# Patient Record
Sex: Female | Born: 1960 | Race: White | Hispanic: No | Marital: Married | Smoking: Never smoker
Health system: Southern US, Community
[De-identification: ages and names within clinical notes are randomized; demographics above are authoritative.]

## PROBLEM LIST (undated history)

## (undated) DIAGNOSIS — I350 Nonrheumatic aortic (valve) stenosis: Secondary | ICD-10-CM

## (undated) DIAGNOSIS — Q2381 Bicuspid aortic valve: Secondary | ICD-10-CM

## (undated) DIAGNOSIS — R112 Nausea with vomiting, unspecified: Secondary | ICD-10-CM

## (undated) DIAGNOSIS — F419 Anxiety disorder, unspecified: Secondary | ICD-10-CM

## (undated) DIAGNOSIS — T8859XA Other complications of anesthesia, initial encounter: Secondary | ICD-10-CM

## (undated) DIAGNOSIS — Z9889 Other specified postprocedural states: Secondary | ICD-10-CM

## (undated) HISTORY — DX: Bicuspid aortic valve: Q23.81

## (undated) HISTORY — PX: TRANSTHORACIC ECHOCARDIOGRAM: SHX275

## (undated) HISTORY — DX: Nonrheumatic aortic (valve) stenosis: I35.0

---
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
## 2008-11-28 HISTORY — PX: ABDOMINAL HYSTERECTOMY: SHX81

## 2014-01-12 DIAGNOSIS — Z8639 Personal history of other endocrine, nutritional and metabolic disease: Secondary | ICD-10-CM | POA: Insufficient documentation

## 2015-01-18 DIAGNOSIS — R002 Palpitations: Secondary | ICD-10-CM | POA: Insufficient documentation

## 2015-01-18 DIAGNOSIS — I8390 Asymptomatic varicose veins of unspecified lower extremity: Secondary | ICD-10-CM | POA: Insufficient documentation

## 2016-03-10 ENCOUNTER — Other Ambulatory Visit: Payer: Self-pay | Admitting: Family Medicine

## 2016-03-10 DIAGNOSIS — R079 Chest pain, unspecified: Secondary | ICD-10-CM

## 2016-03-10 DIAGNOSIS — I493 Ventricular premature depolarization: Secondary | ICD-10-CM

## 2016-03-24 ENCOUNTER — Other Ambulatory Visit (HOSPITAL_COMMUNITY): Payer: Self-pay

## 2016-03-31 HISTORY — PX: TRANSTHORACIC ECHOCARDIOGRAM: SHX275

## 2016-04-20 DIAGNOSIS — Q231 Congenital insufficiency of aortic valve: Secondary | ICD-10-CM | POA: Insufficient documentation

## 2016-04-20 DIAGNOSIS — Q2381 Bicuspid aortic valve: Secondary | ICD-10-CM | POA: Insufficient documentation

## 2016-04-20 DIAGNOSIS — I517 Cardiomegaly: Secondary | ICD-10-CM | POA: Insufficient documentation

## 2016-04-27 DIAGNOSIS — F39 Unspecified mood [affective] disorder: Secondary | ICD-10-CM | POA: Insufficient documentation

## 2016-04-27 DIAGNOSIS — F411 Generalized anxiety disorder: Secondary | ICD-10-CM | POA: Insufficient documentation

## 2016-05-16 ENCOUNTER — Telehealth: Payer: Self-pay | Admitting: Cardiology

## 2016-05-16 NOTE — Telephone Encounter (Signed)
Received records from [facility] for appointment with Dr Ellyn Hack on 06/09/16.  Records given to [employer] (medical records) for D Harding's schedule on 06/09/16. lp

## 2016-05-29 ENCOUNTER — Ambulatory Visit: Payer: BLUE CROSS/BLUE SHIELD | Admitting: Cardiology

## 2016-05-31 HISTORY — PX: LAPAROSCOPIC CHOLECYSTECTOMY: SUR755

## 2016-06-07 DIAGNOSIS — Z9049 Acquired absence of other specified parts of digestive tract: Secondary | ICD-10-CM | POA: Insufficient documentation

## 2016-06-09 ENCOUNTER — Ambulatory Visit: Payer: BLUE CROSS/BLUE SHIELD | Admitting: Cardiology

## 2016-06-14 ENCOUNTER — Encounter: Payer: Self-pay | Admitting: Cardiology

## 2016-08-25 ENCOUNTER — Ambulatory Visit: Payer: BLUE CROSS/BLUE SHIELD | Admitting: Cardiology

## 2016-09-29 ENCOUNTER — Ambulatory Visit (INDEPENDENT_AMBULATORY_CARE_PROVIDER_SITE_OTHER): Payer: BLUE CROSS/BLUE SHIELD | Admitting: Cardiology

## 2016-09-29 ENCOUNTER — Encounter: Payer: Self-pay | Admitting: Cardiology

## 2016-09-29 VITALS — BP 100/98 | HR 78 | Ht 69.0 in | Wt 187.0 lb

## 2016-09-29 DIAGNOSIS — Q231 Congenital insufficiency of aortic valve: Secondary | ICD-10-CM | POA: Diagnosis not present

## 2016-09-29 DIAGNOSIS — Q230 Congenital stenosis of aortic valve: Secondary | ICD-10-CM | POA: Diagnosis not present

## 2016-09-29 DIAGNOSIS — E785 Hyperlipidemia, unspecified: Secondary | ICD-10-CM

## 2016-09-29 NOTE — Progress Notes (Signed)
PCP: Jene Every, MD --Referred by [facility] - Dr. Jene Every  Clinic Note: Chief Complaint  Patient presents with  . Follow-up    New patient.  . Cardiac Valve Problem    Bicuspid Ao Valve    HPI: Olivia Mcgrath is a 56 y.o. female with a PMH below who presents today for Initial Cardiology evaluation fore recently diagnosed Bicuspid Aortic Valve with mild-mod AS - asymptomatic.  Olivia Mcgrath was referred by her PCP @ [facility] after a September 2017 visit for chest pain with a diagnosis of bicuspid aortic valve with mild stenosis Identified for evaluation of her murmur heard preoperatively before her cholecystectomy...  Recent Hospitalizations: n/a  Studies Reviewed:   2-D echo from September 2017: Bicuspid aortic valve with no regurgitation. Mild thickening with moderate calcification. Mild to moderate stenosis. Mild concentric LVH with EF 55-60%. Normal diastolic filling  Interval History: Olivia Mcgrath presents today for evaluation and to establish cardiology care based on the results of her echocardiogram demonstrating mild to moderate aortic stenosis with bicuspid aortic valve. She has relatively no idea what this means, has not had any prior cardiac history. She really doesn't have any cardiac risk factors to speak of. No real family history of coronary disease or valvular cardiac disease.  No history of rheumatic fever.  Besides having occasional headache, and some rare palpitations, she really has no cardiac symptoms. The palpitations usually only occur when she takes an extra cup of coffee or soda. She is fairly active and tries to get a lot of walking in, but has not gotten fully back into her routine since her surgery back in September. 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  Cardiac review of symptoms:  Nno chest pain or shortness of breath with rest or exertion.  No PND, orthopnea or  edema.  No weakness or syncope/near syncope. however she does occasionally note some positional dizziness No TIA/amaurosis fugax symptoms. No claudication.  ROS: A comprehensive was performed. Review of Systems  Constitutional: Negative for weight loss.  HENT: Negative for congestion and nosebleeds.   Respiratory: Negative for shortness of breath.   Cardiovascular: Negative.  Negative for claudication.  Gastrointestinal: Negative for blood in stool and melena.       She does have some loose stools since her surgery, but nothing significant  Genitourinary: Negative for hematuria.  Musculoskeletal: Negative.   Neurological: Positive for dizziness and headaches. Negative for loss of consciousness.  Psychiatric/Behavioral: Negative.   All other systems reviewed and are negative.   Past Medical History:  Diagnosis Date  . Calcific aortic stenosis of bicuspid valve    With mild-moderate stenosis    Past Surgical History:  Procedure Laterality Date  . ABDOMINAL HYSTERECTOMY  11/2008  . LAPAROSCOPIC CHOLECYSTECTOMY  05/2016  . TRANSTHORACIC ECHOCARDIOGRAM  03/2016   Bicuspid aortic valve with no regurgitation. Mild thickening with moderate calcification. Mild to moderate stenosis. Mild concentric LVH with EF 55-60%. Normal diastolic filling    Current Meds  Medication Sig  . escitalopram (LEXAPRO) 10 MG tablet Take 5 mg by mouth daily.  Marland Kitchen estradiol (ESTRACE) 2 MG tablet Take 2 mg by mouth daily.  Marland Kitchen guaiFENesin (MUCINEX) 600 MG 12 hr tablet Take 600 mg by mouth as needed.    Allergies  Allergen Reactions  . Sulfa Antibiotics     Other reaction(s): RASH    Social History   Social History  .  Marital status: Married    Spouse name: N/A  . Number of children: 2  . 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 Smoking status: Never Smoker  . Smokeless tobacco: Never Used  . Alcohol use 0.6 oz/week      1 Glasses of wine per week  . Drug use: No  . Sexual activity: Yes   Other Topics Concern  . None   Social History Narrative   Lives with husband  2 children.   Daughter is Therapist, sports on 2S @ [employer].   Limited exercise - occasional walks up to 2 miles - tries to walk~3-4/wek.       family history includes Atrial fibrillation in her maternal grandmother; Cancer in her maternal grandmother, paternal grandfather, and paternal grandmother; Heart disease in her paternal grandmother; Hyperlipidemia in her father and mother; Hypertension in her father and mother; Lung cancer in her father; Non-Hodgkin's lymphoma in her mother; Rheum arthritis in her brother and mother.  Wt Readings from Last 3 Encounters:  09/29/16 84.8 kg (187 lb)    PHYSICAL EXAM BP (!) 100/98   Pulse 78   Ht 5\' 9"  (1.753 m)   Wt 84.8 kg (187 lb)   BMI 27.62 kg/m  General appearance: alert, cooperative, appears stated age, no distress and Well-nourished, well-groomed.  HEENT: [place]/AT, EOMI, MMM, anicteric sclera Neck: no adenopathy, no carotid bruit and no JVD Lungs: clear to auscultation bilaterally, normal percussion bilaterally and non-labored Heart: regular rate and rhythm, S1 & S2 normal, no click, rub or gallop; nondisplaced PMI. 3/6 early peaking C-D SEM at RUSB radiating to carotids.  Abdomen: soft, non-tender; bowel sounds normal; no masses,  no organomegaly; no HJR Extremities: extremities normal, atraumatic, no cyanosis,  orema  Pulses: 2+ and symmetric; Skin: mobility and turgor normal, no evidence of bleeding or bruising and no lesions noted Neurologic: Mental status: Alert, oriented, thought content appropriate Cranial nerves: normal (II-XII grossly intact)    Adult ECG Report  Rate: 78 ;  Rhythm: normal sinus rhythm and Normal axis, intervals and durations.;   Narrative Interpretation: Normal EKG   Other studies Reviewed: Additional studies/ records that were reviewed today include:  Recent  Labs:  From October 2017   Total cholesterol 234, triglycerides 70, HDL 59, LDL 158   ASSESSMENT / PLAN: Problem List Items Addressed This Visit    Aortic stenosis due to bicuspid aortic valve - Primary (Chronic)    Newly diagnosed asymptomatic aortic valve disease. Last echo was in September 2017. With mild to moderate stenosis, we will follow annually for now. Discussed symptoms to be of concern.  Plan: 2-D echocardiogram in roughly September. She will follow-up after her tests. Discussed importance of aggressive treatment of cardiac risk factors including hyperlipidemia, hypertension and potential diabetes. Her blood pressure is well-controlled, and no reported history of diabetes.      Relevant Orders   EKG 12-Lead   ECHOCARDIOGRAM COMPLETE   Dyslipidemia, goal LDL below 100 (Chronic)    LDL 158 noted from her PCP.  for now the plan seems to be lysed modification, however if this is not successful by the end of the year, think we need to be more aggressive in order to slow the progression of disease with bicuspid aortic valve stenosis.      Relevant Orders   EKG 12-Lead   ECHOCARDIOGRAM COMPLETE  The majority of this visit was history taking as well as descriptive discussion as to the pathophysiology of aortic valve disease with bicuspid aortic valve and the progression of disease to a arch stenosis. We talked about monitoring and management going forward. Over 45 minutes was spent with the patient. Greater than 50% of time was spent in direct consultation.  Current medicines are reviewed at length with the patient today. (+/- concerns) None The following changes have been made: None  Patient Instructions  Schedule at  [address], [address] November 2018 Your physician has requested that you have an echocardiogram. Echocardiography is a painless test that uses sound waves to create images of your heart. It provides your doctor with information about the size and  shape of your heart and how well your heart's chambers and valves are working. This procedure takes approximately one hour. There are no restrictions for this procedure.  No other changes  Your physician wants you to follow-up in: November 2018 with Dr. Ellyn Hack after Echo. You will receive a reminder letter in the mail two months in advance. If you don't receive a letter, please call our office to schedule the follow-up appointment.  Studies Ordered:   Orders Placed This Encounter  Procedures  . EKG 12-Lead  . ECHOCARDIOGRAM COMPLETE      Glenetta Hew, M.D., M.S. Interventional Cardiologist   Pager # [phone] Phone # [phone] [address] [place], [place] [address]

## 2016-09-29 NOTE — Patient Instructions (Addendum)
Schedule at  [address], [address] November 2018 Your physician has requested that you have an echocardiogram. Echocardiography is a painless test that uses sound waves to create images of your heart. It provides your doctor with information about the size and shape of your heart and how well your heart's chambers and valves are working. This procedure takes approximately one hour. There are no restrictions for this procedure.  No other changes  Your physician wants you to follow-up in: November 2018 with Dr. Ellyn Hack after Echo. You will receive a reminder letter in the mail two months in advance. If you don't receive a letter, please call our office to schedule the follow-up appointment.

## 2016-10-01 ENCOUNTER — Encounter: Payer: Self-pay | Admitting: Cardiology

## 2016-10-01 NOTE — Assessment & Plan Note (Signed)
Newly diagnosed asymptomatic aortic valve disease. Last echo was in September 2017. With mild to moderate stenosis, we will follow annually for now. Discussed symptoms to be of concern.  Plan: 2-D echocardiogram in roughly September. She will follow-up after her tests. Discussed importance of aggressive treatment of cardiac risk factors including hyperlipidemia, hypertension and potential diabetes. Her blood pressure is well-controlled, and no reported history of diabetes.

## 2016-10-01 NOTE — Assessment & Plan Note (Signed)
LDL 158 noted from her PCP.  for now the plan seems to be lysed modification, however if this is not successful by the end of the year, think we need to be more aggressive in order to slow the progression of disease with bicuspid aortic valve stenosis.

## 2017-03-30 DIAGNOSIS — G5761 Lesion of plantar nerve, right lower limb: Secondary | ICD-10-CM | POA: Insufficient documentation

## 2017-03-30 DIAGNOSIS — M21611 Bunion of right foot: Secondary | ICD-10-CM | POA: Insufficient documentation

## 2017-06-08 ENCOUNTER — Other Ambulatory Visit (HOSPITAL_COMMUNITY): Payer: BLUE CROSS/BLUE SHIELD

## 2017-06-13 ENCOUNTER — Telehealth: Payer: Self-pay | Admitting: *Deleted

## 2017-06-13 NOTE — Telephone Encounter (Signed)
LEFT MESSAGE TO CALL BACK-- NEED TO TO KNOW IF PATIENT WILL BE COMING TO APPOINTMENT. ECHO WAS NOT DONE 06/18/17. WOULD PREFER PATIENT RESCHEDULE APPOINTMENT UNTIL AFTER TEST IF POSSIBLE.

## 2017-06-15 ENCOUNTER — Ambulatory Visit: Payer: BLUE CROSS/BLUE SHIELD | Admitting: Cardiology

## 2017-06-19 NOTE — Telephone Encounter (Signed)
PATIENT RESCHEDULE APPOINTMENT

## 2017-07-20 ENCOUNTER — Ambulatory Visit (HOSPITAL_COMMUNITY): Payer: BLUE CROSS/BLUE SHIELD | Attending: Internal Medicine

## 2017-07-20 ENCOUNTER — Other Ambulatory Visit: Payer: Self-pay

## 2017-07-20 DIAGNOSIS — E785 Hyperlipidemia, unspecified: Secondary | ICD-10-CM

## 2017-07-20 DIAGNOSIS — Q230 Congenital stenosis of aortic valve: Secondary | ICD-10-CM | POA: Diagnosis present

## 2017-07-20 DIAGNOSIS — I340 Nonrheumatic mitral (valve) insufficiency: Secondary | ICD-10-CM | POA: Diagnosis not present

## 2017-07-20 DIAGNOSIS — Q231 Congenital insufficiency of aortic valve: Secondary | ICD-10-CM

## 2017-08-10 ENCOUNTER — Ambulatory Visit: Payer: BLUE CROSS/BLUE SHIELD | Admitting: Cardiology

## 2017-08-10 ENCOUNTER — Encounter: Payer: Self-pay | Admitting: Cardiology

## 2017-08-10 VITALS — BP 114/84 | HR 69 | Ht 69.5 in | Wt 178.0 lb

## 2017-08-10 DIAGNOSIS — Q230 Congenital stenosis of aortic valve: Secondary | ICD-10-CM

## 2017-08-10 DIAGNOSIS — E785 Hyperlipidemia, unspecified: Secondary | ICD-10-CM

## 2017-08-10 DIAGNOSIS — Q231 Congenital insufficiency of aortic valve: Secondary | ICD-10-CM | POA: Diagnosis not present

## 2017-08-10 DIAGNOSIS — Z79899 Other long term (current) drug therapy: Secondary | ICD-10-CM | POA: Diagnosis not present

## 2017-08-10 NOTE — Progress Notes (Signed)
PCP: Jene Every, MD --Referred by [facility] - Dr. Jene Every  Clinic Note: Chief Complaint  Patient presents with  . Follow-up    follow up, discuss ECHO  . Aortic Stenosis    HPI: Olivia Mcgrath is a 57 y.o. female with a PMH below who presents today for Initial Cardiology evaluation fore recently diagnosed Bicuspid Aortic Valve with mild-mod AS - asymptomatic.  Olivia Mcgrath was referred by her PCP @ [facility] after a September 2017 visit for chest pain with a diagnosis of bicuspid aortic valve with mild stenosis Identified for evaluation of her murmur heard preoperatively before her cholecystectomy...  Recent Hospitalizations: n/a  Studies Reviewed:   2-D echo from September 2017: Bicuspid aortic valve with no regurgitation. Mild thickening with moderate calcification. Mild to moderate stenosis. Mild concentric LVH with EF 55-60%. Normal diastolic filling  2 D Echo 06/2017: - Left ventricle: The cavity size was  normal. Wall thickness was normal. Systolic function was normal. EF 55% to 60%. Normal Diastolic parameters. - Aortic valve: AV is diffiicult to see well It is thickened, calcified Peak and mean gradients through the valve are 45 and 25 mm Hg respectively consistent with moderate AS. Mean gradient (S): 26 mm Hg. Peak gradient (S): 43 mm Hg. - Mitral valve: There was mild regurgitation.   Interval History: Olivia Mcgrath presents today for follow up evaluation to discuss her repeat echocardiogram findings. She remains asymptomatic from a cardiac standpoint. She does not recall having had rheumatic fever history. She denies any chest tightness/pressure or dyspnea with rest or exertion.  Remainder of cardiac review of symptoms: No PND, orthopnea or edema.  No palpitations, lightheadedness, dizziness, weakness or syncope/near syncope. No TIA/amaurosis fugax symptoms. No claudication.  ROS: A comprehensive was performed. Review of Systems  Constitutional:  Negative for weight loss.  HENT: Negative for congestion and nosebleeds.   Respiratory: Negative for shortness of breath.   Cardiovascular: Negative.  Negative for claudication.  Gastrointestinal: Negative for blood in stool and melena.       She does have some loose stools since her surgery, but nothing significant  Genitourinary: Negative for hematuria.    Past Medical History:  Diagnosis Date  . Calcific aortic stenosis of bicuspid valve    2-D echo 02/16/2017: Difficult to see if bicuspid (congenital versus functional) or tricuspid.  thickened, calcified Peak and mean gradients - 45 and 25 mm Hg respectively c/w MODERATE  AS.     Past Surgical History:  Procedure Laterality Date  . ABDOMINAL HYSTERECTOMY  11/2008  . LAPAROSCOPIC CHOLECYSTECTOMY  05/2016  . TRANSTHORACIC ECHOCARDIOGRAM  03/2016   Bicuspid aortic valve with no regurgitation. Mild thickening with moderate calcification. Mild to moderate stenosis (mean gradient 28 mmHg, peak gradient 40.4 mmHg). Mild concentric LVH with EF 55-60%. Normal diastolic filling  . TRANSTHORACIC ECHOCARDIOGRAM      Left ventricle: The cavity size was  normal. Wall thickness was normal. Systolic function was normal. EF 55% to 60%. Normal Diastolic parameters. - Aortic valve: AV is diffiicult to see well It is thickened, calcified Peak and mean gradients through the valve are 45 and 25 mm Hg respectively consistent with moderate AS. Mean gradient (S): 26 mm Hg. Peak gradient (S): 43 mm Hg. Mild MR    Current Meds  Medication Sig  . escitalopram (LEXAPRO) 10 MG tablet Take 5 mg by mouth daily.  Marland Kitchen estradiol (ESTRACE) 2 MG tablet Take 2 mg by mouth daily.  Marland Kitchen guaiFENesin (MUCINEX) 600  MG 12 hr tablet Take 600 mg by mouth as needed.    Allergies  Allergen Reactions  . Sulfa Antibiotics     Other reaction(s): RASH    Social History   Socioeconomic History  . Marital status: Married    Spouse name: None  . Number of children: 2  . Years  of education: 37  . Highest education level: None  Social Needs  . Financial resource strain: None  . Food insecurity - worry: None  . Food insecurity - inability: None  . Transportation needs - medical: None  . Transportation needs - non-medical: None  Occupational History  . 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 Smoking status: Never Smoker  . Smokeless tobacco: Never Used  Substance and Sexual Activity  . Alcohol use: Yes    Alcohol/week: 0.6 oz    Types: 1 Glasses of wine per week  . Drug use: No  . Sexual activity: Yes  Other Topics Concern  . None  Social History Narrative   Lives with husband  2 children.   Daughter is Therapist, sports on 2S @ [employer].   Limited exercise - occasional walks up to 2 miles - tries to walk~3-4/wek.    family history includes Atrial fibrillation in her maternal grandmother; Cancer in her maternal grandmother, paternal grandfather, and paternal grandmother; Heart disease in her paternal grandmother; Hyperlipidemia in her father and mother; Hypertension in her father and mother; Lung cancer in her father; Non-Hodgkin's lymphoma in her mother; Rheum arthritis in her brother and mother.  Wt Readings from Last 3 Encounters:  08/10/17 178 lb (80.7 kg)  09/29/16 187 lb (84.8 kg)    PHYSICAL EXAM BP 114/84   Pulse 69   Ht 5' 9.5" (1.765 m)   Wt 178 lb (80.7 kg)   BMI 25.91 kg/m   Physical Exam  Constitutional: She is oriented to person, place, and time. She appears well-developed and well-nourished. No distress.  Well-groomed  HENT:  Head: Normocephalic and atraumatic.  Neck: No hepatojugular reflux and no JVD present. Carotid bruit is not present (Radiated AS murmur).  Cardiovascular: Normal rate, regular rhythm and normal pulses.  No extrasystoles are present. PMI is not displaced. Exam reveals no gallop and no friction rub.  Murmur heard.  Medium-pitched harsh crescendo-decrescendo midsystolic murmur is present  with a grade of 3/6 at the upper right sternal border radiating to the neck. Pulmonary/Chest: Effort normal and breath sounds normal. No respiratory distress. She has no wheezes. She has no rales.  Abdominal: Soft. Bowel sounds are normal. She exhibits no distension. There is no tenderness.  Musculoskeletal: Normal range of motion. She exhibits no edema.  Neurological: She is alert and oriented to person, place, and time.  Psychiatric: She has a normal mood and affect. Her behavior is normal. Judgment and thought content normal.  Nursing note and vitals reviewed.    Adult ECG Report  Rate: 69;  Rhythm: normal sinus rhythm, premature ventricular contractions (PVC) and Normal axis (borderline left axis, -24), intervals and durations.;   Narrative Interpretation: Stable EKG   Other studies Reviewed: Additional studies/ records that were reviewed today include:  Recent Labs:  Not available.   ASSESSMENT / PLAN: Problem List Items Addressed This Visit    Aortic stenosis due to bicuspid aortic valve - Primary (Chronic)    Interestingly, the need to get changed from a mild to moderate to moderate, but the change in gradients was  not all that significant with mean gradient actually going down and peak gradient going slightly up. More likely this is just simply MILD end of MODERATE stenosis.  - Given the change in findings, we'll recheck in one year. Otherwise continue risk factor modification with blood pressure and lipid control. We discussed symptoms of concern, but I don't expect her to have any of those now.      Relevant Orders   EKG 12-Lead   ECHOCARDIOGRAM COMPLETE   Dyslipidemia, goal LDL below 100 (Chronic)    Last LDL was not very well controlled. She doesn't know when she was last check. We will check a lipid panel with LFTs.  Lipid management is crucial for slowing progression of disease.      Relevant Orders   Lipid panel   Hepatic function panel    Other Visit Diagnoses     Medication management       Relevant Orders   Lipid panel   Hepatic function panel      The majority of this visit was history taking as well as descriptive discussion as to the pathophysiology of aortic valve disease with bicuspid aortic valve and the progression of disease to a arch stenosis. We talked about monitoring and management going forward. Over 45 minutes was spent with the patient. Greater than 50% of time was spent in direct consultation.  Current medicines are reviewed at length with the patient today. (+/- concerns) None The following changes have been made: None  Patient Instructions  NO CHANGE IN MEDICATIONS  LABS- NOTHING TO EAT OR DRINK THE MORNING OF THE TES LIPIDS HEPATIC PANEL    SCHEDULE AT [address] AFTER 07/20/18 Your physician has requested that you have an echocardiogram. Echocardiography is a painless test that uses sound waves to create images of your heart. It provides your doctor with information about the size and shape of your heart and how well your heart's chambers and valves are working. This procedure takes approximately one hour. There are no restrictions for this procedure.   Your physician wants you to follow-up in [place] 2019 [place].You will receive a reminder letter in the mail two months in advance. If you don't receive a letter, please call our office to schedule the follow-up appointment.    If you need a refill on your cardiac medications before your next appointment, please call your pharmacy.   Studies Ordered:   Orders Placed This Encounter  Procedures  . Lipid panel  . Hepatic function panel  . EKG 12-Lead  . ECHOCARDIOGRAM COMPLETE      Olivia Mcgrath, M.D., M.S. Interventional Cardiologist   Pager # [phone] Phone # [phone] [address] [place], [place] [address]

## 2017-08-10 NOTE — Patient Instructions (Addendum)
NO CHANGE IN MEDICATIONS  LABS- NOTHING TO EAT OR DRINK THE MORNING OF THE TES LIPIDS HEPATIC PANEL    SCHEDULE AT [address] AFTER 07/20/18 Your physician has requested that you have an echocardiogram. Echocardiography is a painless test that uses sound waves to create images of your heart. It provides your doctor with information about the size and shape of your heart and how well your heart's chambers and valves are working. This procedure takes approximately one hour. There are no restrictions for this procedure.   Your physician wants you to follow-up in [place] 2019 [place].You will receive a reminder letter in the mail two months in advance. If you don't receive a letter, please call our office to schedule the follow-up appointment.    If you need a refill on your cardiac medications before your next appointment, please call your pharmacy.

## 2017-08-13 ENCOUNTER — Encounter: Payer: Self-pay | Admitting: Cardiology

## 2017-08-13 NOTE — Assessment & Plan Note (Addendum)
Interestingly, the need to get changed from a mild to moderate to moderate, but the change in gradients was not all that significant with mean gradient actually going down and peak gradient going slightly up. More likely this is just simply MILD end of MODERATE stenosis.  - Given the change in findings, we'll recheck in one year. Otherwise continue risk factor modification with blood pressure and lipid control. We discussed symptoms of concern, but I don't expect her to have any of those now.

## 2017-08-13 NOTE — Assessment & Plan Note (Addendum)
Last LDL was not very well controlled. She doesn't know when she was last check. We will check a lipid panel with LFTs.  Lipid management is crucial for slowing progression of disease.

## 2018-02-04 ENCOUNTER — Telehealth: Payer: Self-pay | Admitting: *Deleted

## 2018-02-04 NOTE — Telephone Encounter (Signed)
Left message for pt to call back  °

## 2018-02-04 NOTE — Telephone Encounter (Signed)
    [facility] Pre-operative Risk Assessment    Request for surgical clearance:  1. What type of surgery is being performed? LEFT LAPIDUS 1ST TMT ARTHRODESIS: LEFT MODIFIED MCBRIDE BUNIONECTOMY    2. When is this surgery scheduled? TBD   3. What type of clearance is required (medical clearance vs. Pharmacy clearance to hold med vs. Both)? MEDICAL   4. Are there any medications that need to be held prior to surgery and how long?    5. Practice name and name of physician performing surgery? [facility]    6. What is your office phone number? [phone]     7.   What is your office fax number?  [place]   Anesthesia type (None, local, MAC,  general) ? UNKNOWN

## 2018-02-08 NOTE — Telephone Encounter (Signed)
   Primary Cardiologist: Glenetta Hew, MD  Chart reviewed as part of pre-operative protocol coverage. Patient was contacted 02/08/2018 in reference to pre-operative risk assessment for pending surgery as outlined below.  Olivia Mcgrath was last seen on 08/10/2017 by Dr. Ellyn Hack.  Since that day, Olivia Mcgrath has done well with no chest discomfort, dyspnea, lightheadedness or syncope. She has moderate aortic stenosis which has been stable and she has been asymptomatic.   Therefore, based on ACC/AHA guidelines, the patient would be at acceptable risk for the planned procedure without further cardiovascular testing.   I will route this recommendation to the requesting party via Epic fax function and remove from pre-op pool.  Please call with questions.  Daune Perch, NP 02/08/2018, 9:27 AM

## 2018-07-26 ENCOUNTER — Other Ambulatory Visit (HOSPITAL_COMMUNITY): Payer: BLUE CROSS/BLUE SHIELD

## 2018-11-19 ENCOUNTER — Other Ambulatory Visit: Payer: Self-pay | Admitting: *Deleted

## 2018-11-19 DIAGNOSIS — Q231 Congenital insufficiency of aortic valve: Principal | ICD-10-CM

## 2018-11-19 DIAGNOSIS — Q230 Congenital stenosis of aortic valve: Secondary | ICD-10-CM

## 2018-12-25 ENCOUNTER — Telehealth (HOSPITAL_COMMUNITY): Payer: Self-pay | Admitting: Radiology

## 2018-12-25 NOTE — Telephone Encounter (Signed)
Left message to call office needs to schedule echocardiogram.

## 2019-04-18 ENCOUNTER — Other Ambulatory Visit (HOSPITAL_COMMUNITY): Payer: BLUE CROSS/BLUE SHIELD

## 2019-04-25 ENCOUNTER — Ambulatory Visit: Payer: BLUE CROSS/BLUE SHIELD | Admitting: Cardiology

## 2019-04-25 ENCOUNTER — Other Ambulatory Visit (HOSPITAL_COMMUNITY): Payer: BLUE CROSS/BLUE SHIELD

## 2019-05-09 ENCOUNTER — Other Ambulatory Visit (HOSPITAL_COMMUNITY): Payer: BC Managed Care – PPO

## 2019-05-09 ENCOUNTER — Ambulatory Visit: Payer: BC Managed Care – PPO | Admitting: Cardiology

## 2019-05-19 DIAGNOSIS — E663 Overweight: Secondary | ICD-10-CM | POA: Insufficient documentation

## 2019-05-19 DIAGNOSIS — Z8781 Personal history of (healed) traumatic fracture: Secondary | ICD-10-CM | POA: Insufficient documentation

## 2019-06-01 HISTORY — PX: TRANSTHORACIC ECHOCARDIOGRAM: SHX275

## 2019-06-13 ENCOUNTER — Ambulatory Visit (HOSPITAL_COMMUNITY): Payer: BC Managed Care – PPO | Attending: Cardiovascular Disease

## 2019-06-13 ENCOUNTER — Ambulatory Visit: Payer: BC Managed Care – PPO | Admitting: Cardiology

## 2019-06-13 ENCOUNTER — Other Ambulatory Visit: Payer: Self-pay

## 2019-06-13 VITALS — BP 120/87 | HR 75 | Ht 69.5 in | Wt 193.0 lb

## 2019-06-13 DIAGNOSIS — E785 Hyperlipidemia, unspecified: Secondary | ICD-10-CM | POA: Diagnosis not present

## 2019-06-13 DIAGNOSIS — Q231 Congenital insufficiency of aortic valve: Secondary | ICD-10-CM

## 2019-06-13 DIAGNOSIS — Q230 Congenital stenosis of aortic valve: Secondary | ICD-10-CM

## 2019-06-13 NOTE — Patient Instructions (Signed)
Medication Instructions:  NO CHANGES     Lab Work: NOT NEEDED  Testing/Procedures: WILL BE SCHEDULE AT [place] 2021 Your physician has requested that you have an echocardiogram. Echocardiography is a painless test that uses sound waves to create images of your heart. It provides your doctor with information about the size and shape of your heart and how well your heart's chambers and valves are working. This procedure takes approximately one hour. There are no restrictions for this procedure.   Follow-Up: At [facility], you and your health needs are our priority.  As part of our continuing mission to provide you with exceptional heart care, we have created designated Provider Care Teams.  These Care Teams include your primary Cardiologist (physician) and Advanced Practice Providers (APPs -  Physician Assistants and Nurse Practitioners) who all work together to provide you with the care you need, when you need it.  Your next appointment:   6 months  The format for your next appointment:   In Person  Provider:   Glenetta Hew, MD  Other Instructions

## 2019-06-13 NOTE — Progress Notes (Signed)
Primary Care Provider: Jene Every, MD Cardiologist: Glenetta Hew, MD Electrophysiologist:   Clinic Note: Chief Complaint  Patient presents with  . Follow-up    Delayed annual visit, echo results  . Aortic Stenosis    Progression of disease by echo    HPI:    Olivia Mcgrath is a 58 y.o. female with a history of BICUSPID AORTIC VALVE-PREVIOUSLY NOTED MILD-MODERATE AS below who presents today for delayed annual follow-up.  Olivia Mcgrath was last seen in January 2019 for her second cardiology visit after initial evaluation in March 2018. She had been referred by her PCP at [facility] with a diagnosis of bicuspid aortic valve with mild stenosis identified on echocardiogram performed because of a murmur heard during preop evaluation for cholecystectomy. -She has no recollection of rheumatic fever.  Has been totally asymptomatic from a cardiac standpoint.  Was doing fine at the last visit.  Plan was 2-year follow-up echocardiogram.  Recent Hospitalizations:  March 09, 2019 (was in [place] on vacation and had a fall where she broke her ankle) had surgery.  Has been having lots of issues with that since.  Still limited mobility with lots of pain.  Reviewed  CV studies:    The following studies were reviewed today: (if available, images/films reviewed: From Epic Chart or Care Everywhere) . 2D Echo June 13, 2019: Severely calcified aortic valve-indeterminate number of cuffs.  Focal calcification of LCC with restricted movement.  Likely fusion of NCC/RCC-MODERATE AORTIC STENOSIS APPROACHING SEVERE.  Mean gradient 35.6 mmHg.  AVA estimated 1.06 cm.  EF 60 to 65%.  Normal diastolic function with normal atrial sizes.  Mild dilation of the ascending order of 39 mm.  Normal pulmonic valve and tricuspid valve. o Notable change in mean gradient from 26-28 up to 35.6.  From mild-moderate to moderate-severe.   Interval History:   Olivia Mcgrath returns here today for follow-up  after her echocardiogram.  Her planned follow-up in the spring was delayed due to the COVID-19 and then she had the issue with her ankle fracture in August.  She says that really from a cardiac standpoint she is doing fine.  Over the last 3 months she has been pretty much sedentary because of her ankle injury and is got little out of shape.    About the only thing she notes other than some exertional dyspnea related to her deconditioning is that she has these very intermittent spells of feeling flushed and sweaty they can last maybe about a minute or so.  She says static catches her breath and feel it feels better.  There is been suggestion that this may be related to anxiety and she has been started on Lexapro.  She has been otherwise asymptomatic from a cardiac standpoint.  She has had labs checked by PCP relatively recently, but not available.   CV Review of Symptoms (Summary) positive for - Mild palpitations during these facial flushing spells, but no prolonged arrhythmias or irregular beats. negative for - chest pain, dyspnea on exertion, edema, irregular heartbeat, orthopnea, paroxysmal nocturnal dyspnea, shortness of breath or Syncope/near syncope, TIA/amaurosis fugax, claudication  The patient does not have symptoms concerning for COVID-19 infection (fever, chills, cough, or new shortness of breath).  The patient is practicing social distancing. ++ Masking.  ++ Groceries/shopping.  Safe distancing, masking and hands in addition at work   [place] was performed. Review of Systems  Constitutional: Negative for malaise/fatigue (Just has had to  be sedentary because her ankle) and weight loss.  HENT: Negative for congestion and nosebleeds.   Respiratory: Negative for shortness of breath and wheezing.   Gastrointestinal: Negative for blood in stool, diarrhea (Does have loose stools but not true diarrhea), heartburn and melena.  Genitourinary: Negative for  hematuria.  Musculoskeletal: Positive for joint pain (Right ankle still hurts a lot.  Somewhat swollen still).  Neurological: Negative for dizziness.  Psychiatric/Behavioral: Negative for memory loss. The patient is nervous/anxious. The patient does not have insomnia.    I have reviewed and (if needed) personally updated the patient's problem list, medications, allergies, past medical and surgical history, social and family history.   PAST MEDICAL HISTORY   Past Medical History:  Diagnosis Date  . Calcific aortic stenosis of bicuspid valve    2-D echo 02/16/2017: Difficult to see if bicuspid (congenital versus functional) or tricuspid.  thickened, calcified Peak and mean gradients - 45 and 25 mm Hg respectively c/w MODERATE  AS.      PAST SURGICAL HISTORY   Past Surgical History:  Procedure Laterality Date  . ABDOMINAL HYSTERECTOMY  11/2008  . LAPAROSCOPIC CHOLECYSTECTOMY  05/2016  . TRANSTHORACIC ECHOCARDIOGRAM  03/2016   Bicuspid aortic valve with no regurgitation. Mild thickening with moderate calcification. Mild to moderate stenosis (mean gradient 28 mmHg, peak gradient 40.4 mmHg). Mild concentric LVH with EF 55-60%. Normal diastolic filling  . TRANSTHORACIC ECHOCARDIOGRAM      Left ventricle: The cavity size was  normal. Wall thickness was normal. Systolic function was normal. EF 55% to 60%. Normal Diastolic parameters. - Aortic valve: AV is diffiicult to see well It is thickened, calcified Peak and mean gradients through the valve are 45 and 25 mm Hg respectively consistent with moderate AS. Mean gradient (S): 26 mm Hg. Peak gradient (S): 43 mm Hg. Mild MR     MEDICATIONS/ALLERGIES   Current Meds  Medication Sig  . escitalopram (LEXAPRO) 10 MG tablet Take 5 mg by mouth daily.  Marland Kitchen estradiol (ESTRACE) 2 MG tablet Take 2 mg by mouth daily.  Marland Kitchen guaiFENesin (MUCINEX) 600 MG 12 hr tablet Take 600 mg by mouth as needed.    Allergies  Allergen Reactions  . Sulfa Antibiotics      Other reaction(s): RASH     SOCIAL HISTORY/FAMILY HISTORY   Social History   Tobacco Use  . Smoking status: Never Smoker  . Smokeless tobacco: Never Used  Substance Use Topics  . Alcohol use: Yes    Alcohol/week: 1.0 standard drinks    Types: 1 Glasses of wine per week  . Drug use: No   Social History   Social History Narrative   Lives with husband  2 children.   Daughter is Therapist, sports on [employer] @ [employer].   Limited exercise - occasional walks up to 2 miles - tries to walk~3-4/wek.    Family History family history includes Atrial fibrillation in her maternal grandmother; Cancer in her maternal grandmother, paternal grandfather, and paternal grandmother; Heart disease in her paternal grandmother; Hyperlipidemia in her father and mother; Hypertension in her father and mother; Lung cancer in her father; Non-Hodgkin's lymphoma in her mother; Rheum arthritis in her brother and mother.   OBJCTIVE -PE, EKG, labs   Wt Readings from Last 3 Encounters:  06/13/19 193 lb (87.5 kg)  08/10/17 178 lb (80.7 kg)  09/29/16 187 lb (84.8 kg)    Physical Exam: BP 120/87   Pulse 75   Ht 5' 9.5" (1.765 m)   [facility]  193 lb (87.5 kg)   SpO2 98%   BMI 28.09 kg/m  Physical Exam  Constitutional: She is oriented to person, place, and time. She appears well-developed and well-nourished. No distress.  Healthy-appearing.  Well-groomed.  HENT:  Head: Normocephalic and atraumatic.  Neck: Normal range of motion. Neck supple. Decreased carotid pulses (Mildly delayed) present. No JVD present. Carotid bruit is not present (Suspect referred aortic murmur).  Cardiovascular: Normal rate, regular rhythm and normal pulses.  No extrasystoles are present. PMI is not displaced. Exam reveals no gallop and no friction rub.  Murmur heard. High-pitched harsh crescendo-decrescendo mid to late systolic murmur is present with a grade of 4/6 at the upper right sternal border radiating to the neck. No diastolic murmur  Pulmonary/Chest: Effort normal and breath sounds normal. No respiratory distress. She has no wheezes. She has no rales.  Abdominal: Soft. Bowel sounds are normal. She exhibits no distension. There is no abdominal tenderness. There is no rebound.  Musculoskeletal: Normal range of motion.        General: Deformity (Right ankle is somewhat swollen, tender to touch) present. No edema.  Neurological: She is alert and oriented to person, place, and time.  Psychiatric: She has a normal mood and affect. Her behavior is normal. Judgment and thought content normal.  Vitals reviewed.   Adult ECG Report  Rate: 65 ;  Rhythm: normal sinus rhythm and Normal axis, intervals and durations.;   Narrative Interpretation: Normal EKG  Recent Labs: Recently checked by PCP.  Not available No results found for: CHOL, HDL, LDLCALC, LDLDIRECT, TRIG, CHOLHDL No results found for: CREATININE, BUN, NA, K, CL, CO2  ASSESSMENT/PLAN    Problem List Items Addressed This Visit    Aortic stenosis due to bicuspid aortic valve - Primary (Chronic)    Could be congenitally bicuspid aortic valve versus functional due to fusion of cusps.  Unable to really determine.  However, after having 2 echoes with relatively stable gradients, she is now had a pretty significant jump in the last 2 years to a gradient of 35-36 mmHg which is in the moderate to severe range.  I am concerned about how quickly this is jumped and would like to check sooner than 1 year.    I spent about 10 minutes explaining the pathophysiology of calcific and possible bicuspid aortic valve disease and stenosis.  The progression of disease and the concerning symptoms involved as well as the notable drop in life expectancy once symptoms occur.  I talked about potential for open repair versus less likely TAVR.  Currently she is totally asymptomatic, however the seemingly rapid progression is concerning.  Plan: We will check a follow-up echo in 6 months to see if there  is any progression.  In order to help alleviate progression of disease, need to consider more strict management of lipids.  Would try to get lipid levels down with LDL below 70 if possible.  \She has relatively stable blood pressure with no medicines.      Relevant Orders   EKG 12-Lead   ECHOCARDIOGRAM COMPLETE   Dyslipidemia, goal LDL below 100 (Chronic)    Previously, her LDL has not been well controlled.  Labs recently checked by PCP.  Hopefully will have those results when I see her in follow-up.  Would like to consider starting statin if LDL is not close to or less than 100.  Target would be less than 70.          COVID-19 Education: The signs and  symptoms of COVID-19 were discussed with the patient and how to seek care for testing (follow up with PCP or arrange E-visit).   The importance of social distancing was discussed today.  I spent a total of 34minutes with the patient and chart review. >  50% of the time was spent in direct patient consultation. -->  Reviewed echocardiogram results, compared to previous reports to this current report.  Used models and pictures to explain pathophysiology.  Then explained clinical progression and symptoms.  We also discussed options for AVR with preop evaluation by cardiac catheterization. Additional time spent with chart review (studies, outside notes, etc): 8 Total Time: 30 min   Current medicines are reviewed at length with the patient today.  (+/- concerns) n/a   Patient Instructions / Medication Changes & Studies & Tests Ordered   Patient Instructions  Medication Instructions:  NO CHANGES     Lab Work: NOT NEEDED  Testing/Procedures: WILL BE SCHEDULE AT [place] 2021 Your physician has requested that you have an echocardiogram. Echocardiography is a painless test that uses sound waves to create images of your heart. It provides your doctor with information about the size and shape of your heart  and how well your heart's chambers and valves are working. This procedure takes approximately one hour. There are no restrictions for this procedure.   Follow-Up: At [facility], you and your health needs are our priority.  As part of our continuing mission to provide you with exceptional heart care, we have created designated Provider Care Teams.  These Care Teams include your primary Cardiologist (physician) and Advanced Practice Providers (APPs -  Physician Assistants and Nurse Practitioners) who all work together to provide you with the care you need, when you need it.  Your next appointment:   6 months  The format for your next appointment:   In Person  Provider:   Glenetta Hew, MD  Other Instructions     Studies Ordered:   Orders Placed This Encounter  Procedures  . EKG 12-Lead  . ECHOCARDIOGRAM COMPLETE     Glenetta Hew, M.D., M.S. Interventional Cardiologist   Pager # [phone] Phone # [phone] [address] [place], [place] [address]   Thank you for choosing Heartcare at [facility]!!

## 2019-06-15 ENCOUNTER — Encounter: Payer: Self-pay | Admitting: Cardiology

## 2019-06-15 NOTE — Assessment & Plan Note (Signed)
Previously, her LDL has not been well controlled.  Labs recently checked by PCP.  Hopefully will have those results when I see her in follow-up.  Would like to consider starting statin if LDL is not close to or less than 100.  Target would be less than 70.

## 2019-06-15 NOTE — Assessment & Plan Note (Signed)
Could be congenitally bicuspid aortic valve versus functional due to fusion of cusps.  Unable to really determine.  However, after having 2 echoes with relatively stable gradients, she is now had a pretty significant jump in the last 2 years to a gradient of 35-36 mmHg which is in the moderate to severe range.  I am concerned about how quickly this is jumped and would like to check sooner than 1 year.    I spent about 10 minutes explaining the pathophysiology of calcific and possible bicuspid aortic valve disease and stenosis.  The progression of disease and the concerning symptoms involved as well as the notable drop in life expectancy once symptoms occur.  I talked about potential for open repair versus less likely TAVR.  Currently she is totally asymptomatic, however the seemingly rapid progression is concerning.  Plan: We will check a follow-up echo in 6 months to see if there is any progression.  In order to help alleviate progression of disease, need to consider more strict management of lipids.  Would try to get lipid levels down with LDL below 70 if possible.  \She has relatively stable blood pressure with no medicines.

## 2019-08-01 HISTORY — PX: ANKLE FRACTURE SURGERY: SHX122

## 2019-11-21 DIAGNOSIS — L821 Other seborrheic keratosis: Secondary | ICD-10-CM | POA: Diagnosis not present

## 2019-11-21 DIAGNOSIS — L820 Inflamed seborrheic keratosis: Secondary | ICD-10-CM | POA: Diagnosis not present

## 2019-11-21 DIAGNOSIS — L814 Other melanin hyperpigmentation: Secondary | ICD-10-CM | POA: Diagnosis not present

## 2019-11-21 DIAGNOSIS — L578 Other skin changes due to chronic exposure to nonionizing radiation: Secondary | ICD-10-CM | POA: Diagnosis not present

## 2019-11-21 DIAGNOSIS — D1801 Hemangioma of skin and subcutaneous tissue: Secondary | ICD-10-CM | POA: Diagnosis not present

## 2019-11-29 HISTORY — PX: TRANSTHORACIC ECHOCARDIOGRAM: SHX275

## 2019-12-19 ENCOUNTER — Other Ambulatory Visit: Payer: Self-pay

## 2019-12-19 ENCOUNTER — Ambulatory Visit (HOSPITAL_COMMUNITY): Payer: BC Managed Care – PPO | Attending: Cardiology

## 2019-12-19 DIAGNOSIS — Q230 Congenital stenosis of aortic valve: Secondary | ICD-10-CM

## 2019-12-19 DIAGNOSIS — Q231 Congenital insufficiency of aortic valve: Secondary | ICD-10-CM

## 2019-12-22 ENCOUNTER — Other Ambulatory Visit: Payer: Self-pay | Admitting: *Deleted

## 2019-12-22 DIAGNOSIS — Q231 Congenital insufficiency of aortic valve: Secondary | ICD-10-CM

## 2020-01-26 DIAGNOSIS — M2550 Pain in unspecified joint: Secondary | ICD-10-CM | POA: Diagnosis not present

## 2020-01-26 DIAGNOSIS — E663 Overweight: Secondary | ICD-10-CM | POA: Diagnosis not present

## 2020-01-26 DIAGNOSIS — F39 Unspecified mood [affective] disorder: Secondary | ICD-10-CM | POA: Diagnosis not present

## 2020-01-26 DIAGNOSIS — I350 Nonrheumatic aortic (valve) stenosis: Secondary | ICD-10-CM | POA: Diagnosis not present

## 2020-02-10 ENCOUNTER — Ambulatory Visit: Payer: BC Managed Care – PPO | Admitting: Cardiology

## 2020-02-27 ENCOUNTER — Encounter: Payer: Self-pay | Admitting: Cardiology

## 2020-02-27 ENCOUNTER — Ambulatory Visit: Payer: BC Managed Care – PPO | Admitting: Cardiology

## 2020-02-27 ENCOUNTER — Other Ambulatory Visit: Payer: Self-pay

## 2020-02-27 VITALS — BP 122/78 | HR 62 | Ht 69.5 in | Wt 202.0 lb

## 2020-02-27 DIAGNOSIS — Q230 Congenital stenosis of aortic valve: Secondary | ICD-10-CM

## 2020-02-27 DIAGNOSIS — E785 Hyperlipidemia, unspecified: Secondary | ICD-10-CM | POA: Diagnosis not present

## 2020-02-27 DIAGNOSIS — Q231 Congenital insufficiency of aortic valve: Secondary | ICD-10-CM

## 2020-02-27 LAB — LIPID PANEL
Chol/HDL Ratio: 3.3 ratio (ref 0.0–4.4)
Cholesterol, Total: 210 mg/dL — ABNORMAL HIGH (ref 100–199)
HDL: 64 mg/dL (ref >39)
LDL Chol Calc (NIH): 126 mg/dL — ABNORMAL HIGH (ref 0–99)
Triglycerides: 116 mg/dL (ref 0–149)
VLDL Cholesterol Cal: 20 mg/dL (ref 5–40)

## 2020-02-27 NOTE — Patient Instructions (Addendum)
Medication Instructions:  Your physician recommends that you continue on your current medications as directed. Please refer to the Current Medication list given to you today.  *If you need a refill on your cardiac medications before your next appointment, please call your pharmacy*  Lab Work: Your physician recommends that you return for lab work TODAY:  Fasting Lipid Panel  If you have labs (blood work) drawn today and your tests are completely normal, you will receive your results only by:  MyChart Message (if you have MyChart) OR  A paper copy in the mail If you have any lab test that is abnormal or we need to change your treatment, we will call you to review the results.  Testing/Procedures: Your physician has requested that you have an annual echocardiogram. Echocardiography is a painless test that uses sound waves to create images of your heart. It provides your doctor with information about the size and shape of your heart and how well your hearts chambers and valves are working. This procedure takes approximately one hour. There are no restrictions for this procedure.   Someone will contact you to get you schedule for May 2022  Follow-Up: At [facility], you and your health needs are our priority.  As part of our continuing mission to provide you with exceptional heart care, we have created designated Provider Care Teams.  These Care Teams include your primary Cardiologist (physician) and Advanced Practice Providers (APPs -  Physician Assistants and Nurse Practitioners) who all work together to provide you with the care you need, when you need it.  Your next appointment:   1 year(s)  The format for your next appointment:   In Person  Provider:   Glenetta Hew, MD  Other Instructions No new instructions

## 2020-02-27 NOTE — Progress Notes (Signed)
Primary Care Provider: Jene Every, MD Cardiologist: Glenetta Hew, MD Electrophysiologist: None  Clinic Note: Chief Complaint  Patient presents with  . Follow-up    Echo results  . Aortic Stenosis    Bicuspid aortic valve, moderate to severe; asymptomatic    HPI:    Olivia Mcgrath is a 59 y.o. female with a PMH notable for BICUSPID AORTIC VALVE--MODERATE AS who presents today for    2D Echo June 13, 2019: Severely calcified aortic valve-indeterminate number of cuffs.  Focal calcification of LCC with restricted movement.  Likely fusion of NCC/RCC-MODERATE AORTIC STENOSIS APPROACHING SEVERE.  Mean gradient 35.6 mmHg.  AVA estimated 1.06 cm.  EF 60 to 65%.  Normal diastolic function with normal atrial sizes.  Mild dilation of the ascending order of 39 mm.  Normal pulmonic valve and tricuspid valve. ? Notable change in mean gradient from 26-28 up to 35.6.  From mild-moderate to moderate-severe.  Olivia Mcgrath was last seen on June 13, 2019 to follow-up on echocardiogram showing progression of her stenosis not to moderate with mean gradient 35 mmHg.  AVA estimated at 1.06 cm.  This was an increase from 28 mmHg mean gradient.  She was totally asymptomatic.  Major issue was that she had fractured her ankle and was out of shape.  Dyspnea from deconditioning only.  Had intermittent spells that were thought to be panic attacks treated with Lexapro.  Recent Hospitalizations: None  Reviewed  CV studies:    The following studies were reviewed today: (if available, images/films reviewed: From Epic Chart or Care Everywhere) . TTE 12/19/2019: EF 60 to 65%.  GR 1 DD.  No R WMA.  Mild LA dilation.  Stable moderate-severe AS (mean gradient roughly 30 mmHg with peak 53 mmHg) stable.  Okay to follow-up in 1 year.  Interval History:   Olivia Mcgrath presents here today to discuss results of her echocardiogram.  She is pretty asymptomatic from cardiac standpoint.  She is just not getting back  into shape after recovering from her ankle fracture.  As a result she does have some exertional dyspnea, no chest pain or pressure.  No resting dyspnea or PND, orthopnea.  Not really edema, just a swelling in the right ankle still.  No lightheadedness or dizziness, syncope/near syncope.    She says that her anxiety symptoms seem to notably improved with Lexapro.  No longer having the flushing tachycardia spells.  The patient does not have symptoms concerning for COVID-19 infection (fever, chills, cough, or new shortness of breath).  The patient is practicing social distancing & Masking.    REVIEWED OF SYSTEMS   Review of Systems  Constitutional: Negative for malaise/fatigue (Still not back in shape yet, but energy improving.).  HENT: Negative for nosebleeds.   Respiratory: Negative for cough and shortness of breath.   Gastrointestinal: Negative for blood in stool and melena.  Genitourinary: Negative for hematuria.  Musculoskeletal: Positive for joint pain (Ankles pain seems to be improving, but still not back to normal).  Neurological: Negative for dizziness, focal weakness and headaches.  Endo/Heme/Allergies: Negative for environmental allergies. Does not bruise/bleed easily.  Psychiatric/Behavioral: Negative.  The patient is not nervous/anxious (The symptoms have improved on Lexapro.).    I have reviewed and (if needed) personally updated the patient's problem list, medications, allergies, past medical and surgical history, social and family history.   PAST MEDICAL HISTORY   Past Medical History:  Diagnosis Date  . Calcific aortic stenosis of bicuspid valve    2-D echo  02/16/2017: Difficult to see if bicuspid (congenital versus functional) or tricuspid.  thickened, calcified Peak and mean gradients - 45 and 25 mm Hg respectively c/w MODERATE  AS.     PAST SURGICAL HISTORY   Past Surgical History:  Procedure Laterality Date  . ABDOMINAL HYSTERECTOMY  11/2008  . LAPAROSCOPIC  CHOLECYSTECTOMY  05/2016  . TRANSTHORACIC ECHOCARDIOGRAM  03/2016   Bicuspid aortic valve with no regurgitation. Mild thickening with moderate calcification. Mild to moderate stenosis (mean gradient 28 mmHg, peak gradient 40.4 mmHg). Mild concentric LVH with EF 55-60%. Normal diastolic filling  . TRANSTHORACIC ECHOCARDIOGRAM  06/2019    Left ventricle: The cavity size was  normal. Wall thickness was normal. Systolic function was normal. EF 55% to 60%. Normal Diastolic parameters. - Aortic valve: AV is diffiicult to see well It is thickened, calcified Peak and mean gradients through the valve are 45 and 25 mm Hg respectively consistent with moderate AS. Mean gradient (S): 26 mm Hg. Peak gradient (S): 43 mm Hg. Mild MR  . TRANSTHORACIC ECHOCARDIOGRAM  11/2019   EF 60 to 65%.  GR 1 DD.  No R WMA.  Mild LA dilation.  Stable moderate-severe AS (mean gradient roughly 30 mmHg with peak 53 mmHg) stable.  Okay to follow-up in 1 year.    MEDICATIONS/ALLERGIES   Current Meds  Medication Sig  . escitalopram (LEXAPRO) 10 MG tablet Take 5 mg by mouth daily.  Marland Kitchen estradiol (ESTRACE) 2 MG tablet Take 2 mg by mouth daily.  Marland Kitchen guaiFENesin (MUCINEX) 600 MG 12 hr tablet Take 600 mg by mouth as needed.    Allergies  Allergen Reactions  . Sulfa Antibiotics     Other reaction(s): RASH    SOCIAL HISTORY/FAMILY HISTORY   Reviewed in Epic:  Pertinent findings: No new findings  OBJCTIVE -PE, EKG, labs   Wt Readings from Last 3 Encounters:  02/27/20 202 lb (91.6 kg)  06/13/19 193 lb (87.5 kg)  08/10/17 178 lb (80.7 kg)    Physical Exam: BP 122/78   Pulse 62   Ht 5' 9.5" (1.765 m)   Wt 202 lb (91.6 kg)   SpO2 99%   BMI 29.40 kg/m  Physical Exam Constitutional:      General: She is not in acute distress.    Appearance: Normal appearance. She is obese. She is not ill-appearing.     Comments: Well-groomed.  Healthy-appearing  HENT:     Head: Normocephalic and atraumatic.  Neck:     Vascular:  Decreased carotid pulses (Borderline delayed carotid upstroke. ). No carotid bruit ( Referred aortic murmur. ), hepatojugular reflux or JVD.  Cardiovascular:     Rate and Rhythm: Normal rate and regular rhythm. Occasional extrasystoles are present.    Chest Wall: PMI is not displaced.     Pulses: Normal pulses.     Heart sounds: S1 normal and S2 normal. Murmur (4/6 SEM C-D at RUSB--carotid.  No DM heard) heard.  No friction rub. No gallop.   Pulmonary:     Effort: Pulmonary effort is normal. No respiratory distress.     Breath sounds: Normal breath sounds.  Chest:     Chest wall: No tenderness.  Abdominal:     General: Abdomen is flat. Bowel sounds are normal. There is no distension.     Palpations: Abdomen is soft. There is no mass (No HSM or bruit; radiated aortic murmur).  Musculoskeletal:        General: No swelling. Normal range of motion.  Cervical back: Normal range of motion.  Neurological:     General: No focal deficit present.     Mental Status: She is alert and oriented to person, place, and time.  Psychiatric:        Mood and Affect: Mood normal.        Behavior: Behavior normal.        Thought Content: Thought content normal.        Judgment: Judgment normal.     Adult ECG Report  Rate: 62 ;  Rhythm: normal sinus rhythm and Normal axis, intervals and durations.;   Narrative Interpretation: Normal EKG  Recent Labs:    October 2020-Lipid Panel: TC 239, TG 257, HDL 63, LDL 125. Cr 0.60.  K+ 4.7.  Otherwise normal electrolytes and LFTs.  TSH 1.23.  Normal CBC-H/H 13.3/39.4.  ASSESSMENT/PLAN   Problem List Items Addressed This Visit    Aortic stenosis due to bicuspid aortic valve - Primary (Chronic)    She seems a little old to be now finally having severe stenosis from congenital bicuspid valve, cannot be sure if this is congenital or functional.  She had a pretty significant jump up and gradient last echo which is now not as significant with current echo.  As she  is asymptomatic, we can simply follow-up with an echo in 1 year.  We did go back over concerning symptoms of anginal chest pain, exertional dyspnea, CHF symptoms of PND, orthopnea edema and syncope/near syncope.  We will pay more attention to risk factors such as hyperlipidemia.      Relevant Orders   EKG 12-Lead (Completed)   Dyslipidemia, goal LDL below 100 (Chronic)    Labs still not controlled.  We will recheck lipid panel now to see where she stands.  If LDL is not less than 100, will start statin at 20 mg rosuvastatin.      Relevant Orders   Lipid panel (Completed)     --------------------------------------------------------------- COVID-19 Education: The signs and symptoms of COVID-19 were discussed with the patient and how to seek care for testing (follow up with PCP or arrange E-visit).   The importance of social distancing and COVID-19 vaccination was discussed today.  I spent a total of 2minutes with the patient. >  50% of the time was spent in direct patient consultation.  Additional time spent with chart review  / charting (studies, outside notes, etc): 6 Total Time: 24 min   Current medicines are reviewed at length with the patient today.  (+/- concerns) none  Notice: This dictation was prepared with Dragon dictation along with smaller phrase technology. Any transcriptional errors that result from this process are unintentional and may not be corrected upon review.  Patient Instructions / Medication Changes & Studies & Tests Ordered   Patient Instructions  Medication Instructions:  Your physician recommends that you continue on your current medications as directed. Please refer to the Current Medication list given to you today.  *If you need a refill on your cardiac medications before your next appointment, please call your pharmacy*  Lab Work: Your physician recommends that you return for lab work TODAY:  Fasting Lipid Panel  If you have labs (blood work)  drawn today and your tests are completely normal, you will receive your results only by: Marland Kitchen MyChart Message (if you have MyChart) OR . A paper copy in the mail If you have any lab test that is abnormal or we need to change your treatment, we will call you to  review the results.  Testing/Procedures: Your physician has requested that you have an annual echocardiogram. Echocardiography is a painless test that uses sound waves to create images of your heart. It provides your doctor with information about the size and shape of your heart and how well your heart's chambers and valves are working. This procedure takes approximately one hour. There are no restrictions for this procedure.   Someone will contact you to get you schedule for May 2022  Follow-Up: At [facility], you and your health needs are our priority.  As part of our continuing mission to provide you with exceptional heart care, we have created designated Provider Care Teams.  These Care Teams include your primary Cardiologist (physician) and Advanced Practice Providers (APPs -  Physician Assistants and Nurse Practitioners) who all work together to provide you with the care you need, when you need it.  Your next appointment:   1 year(s)  The format for your next appointment:   In Person  Provider:   Glenetta Hew, MD  Other Instructions No new instructions    Studies Ordered:   Orders Placed This Encounter  Procedures  . Lipid panel  . EKG 12-Lead     Glenetta Hew, M.D., M.S. Interventional Cardiologist   Pager # [phone] Phone # [phone] [address] [place], [place] [address]   Thank you for choosing [facility] at [facility]!!

## 2020-03-03 DIAGNOSIS — J069 Acute upper respiratory infection, unspecified: Secondary | ICD-10-CM | POA: Diagnosis not present

## 2020-03-03 DIAGNOSIS — Z1152 Encounter for screening for COVID-19: Secondary | ICD-10-CM | POA: Diagnosis not present

## 2020-03-03 DIAGNOSIS — R030 Elevated blood-pressure reading, without diagnosis of hypertension: Secondary | ICD-10-CM | POA: Diagnosis not present

## 2020-03-04 ENCOUNTER — Encounter: Payer: Self-pay | Admitting: Cardiology

## 2020-03-04 ENCOUNTER — Telehealth: Payer: Self-pay | Admitting: *Deleted

## 2020-03-04 DIAGNOSIS — E785 Hyperlipidemia, unspecified: Secondary | ICD-10-CM

## 2020-03-04 NOTE — Telephone Encounter (Signed)
-----   Message from Leonie Man, MD sent at 02/29/2020  2:20 AM EDT ----- Cholesterol levels are higher than we would like to be.  Total cholesterol 210 and LDL is 126.  Would like LDL to be less than 100 -->   Recommend that we recheck in 6 months after you have made a conscious effort to increase exercise level and adjust her diet to avoid excess fatty foods (mostly animal products) / more fruits & veggies & oatmeal.  Recheck Fasting Lipids Panel in 6 months --> if no appreciable improvement, would recommend that we consider medical management.  Glenetta Hew, MD

## 2020-03-04 NOTE — Telephone Encounter (Signed)
Mailed labslip to patient for Fasting lipids for 6 months per order form lab results

## 2020-03-04 NOTE — Assessment & Plan Note (Signed)
She seems a little old to be now finally having severe stenosis from congenital bicuspid valve, cannot be sure if this is congenital or functional.  She had a pretty significant jump up and gradient last echo which is now not as significant with current echo.  As she is asymptomatic, we can simply follow-up with an echo in 1 year.  We did go back over concerning symptoms of anginal chest pain, exertional dyspnea, CHF symptoms of PND, orthopnea edema and syncope/near syncope.  We will pay more attention to risk factors such as hyperlipidemia.

## 2020-03-04 NOTE — Assessment & Plan Note (Signed)
Labs still not controlled.  We will recheck lipid panel now to see where she stands.  If LDL is not less than 100, will start statin at 20 mg rosuvastatin.

## 2020-03-06 DIAGNOSIS — Z20822 Contact with and (suspected) exposure to covid-19: Secondary | ICD-10-CM | POA: Diagnosis not present

## 2020-03-06 DIAGNOSIS — U071 COVID-19: Secondary | ICD-10-CM | POA: Diagnosis not present

## 2020-03-13 DIAGNOSIS — F39 Unspecified mood [affective] disorder: Secondary | ICD-10-CM | POA: Diagnosis not present

## 2020-03-13 DIAGNOSIS — R0789 Other chest pain: Secondary | ICD-10-CM | POA: Diagnosis not present

## 2020-03-13 DIAGNOSIS — Z7982 Long term (current) use of aspirin: Secondary | ICD-10-CM | POA: Diagnosis not present

## 2020-03-13 DIAGNOSIS — J1282 Pneumonia due to coronavirus disease 2019: Secondary | ICD-10-CM | POA: Diagnosis not present

## 2020-03-13 DIAGNOSIS — N951 Menopausal and female climacteric states: Secondary | ICD-10-CM | POA: Diagnosis not present

## 2020-03-13 DIAGNOSIS — U071 COVID-19: Secondary | ICD-10-CM | POA: Diagnosis not present

## 2020-03-13 DIAGNOSIS — D6859 Other primary thrombophilia: Secondary | ICD-10-CM | POA: Diagnosis not present

## 2020-03-13 DIAGNOSIS — R198 Other specified symptoms and signs involving the digestive system and abdomen: Secondary | ICD-10-CM | POA: Diagnosis not present

## 2020-03-13 DIAGNOSIS — R05 Cough: Secondary | ICD-10-CM | POA: Diagnosis not present

## 2020-03-13 DIAGNOSIS — F329 Major depressive disorder, single episode, unspecified: Secondary | ICD-10-CM | POA: Diagnosis not present

## 2020-03-13 DIAGNOSIS — R0602 Shortness of breath: Secondary | ICD-10-CM | POA: Diagnosis not present

## 2020-03-13 DIAGNOSIS — Z6829 Body mass index (BMI) 29.0-29.9, adult: Secondary | ICD-10-CM | POA: Diagnosis not present

## 2020-03-13 DIAGNOSIS — Z87891 Personal history of nicotine dependence: Secondary | ICD-10-CM | POA: Diagnosis not present

## 2020-03-13 DIAGNOSIS — E663 Overweight: Secondary | ICD-10-CM | POA: Diagnosis not present

## 2020-03-25 DIAGNOSIS — Z09 Encounter for follow-up examination after completed treatment for conditions other than malignant neoplasm: Secondary | ICD-10-CM | POA: Diagnosis not present

## 2020-03-25 DIAGNOSIS — Z6829 Body mass index (BMI) 29.0-29.9, adult: Secondary | ICD-10-CM | POA: Diagnosis not present

## 2020-07-22 DIAGNOSIS — Z1231 Encounter for screening mammogram for malignant neoplasm of breast: Secondary | ICD-10-CM | POA: Diagnosis not present

## 2020-07-31 DIAGNOSIS — U071 COVID-19: Secondary | ICD-10-CM

## 2020-07-31 HISTORY — DX: COVID-19: U07.1

## 2020-08-27 DIAGNOSIS — Z6826 Body mass index (BMI) 26.0-26.9, adult: Secondary | ICD-10-CM | POA: Diagnosis not present

## 2020-08-27 DIAGNOSIS — R221 Localized swelling, mass and lump, neck: Secondary | ICD-10-CM | POA: Diagnosis not present

## 2020-09-18 DIAGNOSIS — R9389 Abnormal findings on diagnostic imaging of other specified body structures: Secondary | ICD-10-CM | POA: Diagnosis not present

## 2020-09-18 DIAGNOSIS — R221 Localized swelling, mass and lump, neck: Secondary | ICD-10-CM | POA: Diagnosis not present

## 2020-09-24 DIAGNOSIS — Z01419 Encounter for gynecological examination (general) (routine) without abnormal findings: Secondary | ICD-10-CM | POA: Diagnosis not present

## 2020-09-24 DIAGNOSIS — Z90710 Acquired absence of both cervix and uterus: Secondary | ICD-10-CM | POA: Diagnosis not present

## 2020-10-29 HISTORY — PX: TRANSTHORACIC ECHOCARDIOGRAM: SHX275

## 2020-11-04 DIAGNOSIS — I350 Nonrheumatic aortic (valve) stenosis: Secondary | ICD-10-CM | POA: Diagnosis not present

## 2020-11-04 DIAGNOSIS — R030 Elevated blood-pressure reading, without diagnosis of hypertension: Secondary | ICD-10-CM | POA: Diagnosis not present

## 2020-11-04 DIAGNOSIS — R110 Nausea: Secondary | ICD-10-CM | POA: Diagnosis not present

## 2020-11-04 DIAGNOSIS — R55 Syncope and collapse: Secondary | ICD-10-CM | POA: Diagnosis not present

## 2020-11-06 ENCOUNTER — Other Ambulatory Visit: Payer: Self-pay

## 2020-11-06 ENCOUNTER — Inpatient Hospital Stay (HOSPITAL_COMMUNITY): Payer: BC Managed Care – PPO

## 2020-11-06 ENCOUNTER — Emergency Department (HOSPITAL_COMMUNITY): Payer: BC Managed Care – PPO

## 2020-11-06 ENCOUNTER — Encounter (HOSPITAL_COMMUNITY): Payer: Self-pay | Admitting: Emergency Medicine

## 2020-11-06 ENCOUNTER — Inpatient Hospital Stay (HOSPITAL_COMMUNITY)
Admission: EM | Admit: 2020-11-06 | Discharge: 2020-11-09 | DRG: 287 | Disposition: A | Discharge status: Home / Self Care | Payer: BC Managed Care – PPO | Attending: Cardiovascular Disease | Admitting: Cardiovascular Disease

## 2020-11-06 DIAGNOSIS — Z801 Family history of malignant neoplasm of trachea, bronchus and lung: Secondary | ICD-10-CM | POA: Diagnosis not present

## 2020-11-06 DIAGNOSIS — Z807 Family history of other malignant neoplasms of lymphoid, hematopoietic and related tissues: Secondary | ICD-10-CM

## 2020-11-06 DIAGNOSIS — R7989 Other specified abnormal findings of blood chemistry: Secondary | ICD-10-CM | POA: Diagnosis not present

## 2020-11-06 DIAGNOSIS — Z8261 Family history of arthritis: Secondary | ICD-10-CM

## 2020-11-06 DIAGNOSIS — I200 Unstable angina: Secondary | ICD-10-CM

## 2020-11-06 DIAGNOSIS — I774 Celiac artery compression syndrome: Secondary | ICD-10-CM | POA: Diagnosis not present

## 2020-11-06 DIAGNOSIS — I214 Non-ST elevation (NSTEMI) myocardial infarction: Secondary | ICD-10-CM

## 2020-11-06 DIAGNOSIS — Z882 Allergy status to sulfonamides status: Secondary | ICD-10-CM

## 2020-11-06 DIAGNOSIS — I400 Infective myocarditis: Principal | ICD-10-CM | POA: Diagnosis present

## 2020-11-06 DIAGNOSIS — Z83438 Family history of other disorder of lipoprotein metabolism and other lipidemia: Secondary | ICD-10-CM | POA: Diagnosis not present

## 2020-11-06 DIAGNOSIS — R079 Chest pain, unspecified: Secondary | ICD-10-CM | POA: Diagnosis not present

## 2020-11-06 DIAGNOSIS — Q251 Coarctation of aorta: Secondary | ICD-10-CM | POA: Diagnosis not present

## 2020-11-06 DIAGNOSIS — Q231 Congenital insufficiency of aortic valve: Secondary | ICD-10-CM

## 2020-11-06 DIAGNOSIS — R0602 Shortness of breath: Secondary | ICD-10-CM | POA: Diagnosis not present

## 2020-11-06 DIAGNOSIS — Z8249 Family history of ischemic heart disease and other diseases of the circulatory system: Secondary | ICD-10-CM | POA: Diagnosis not present

## 2020-11-06 DIAGNOSIS — R778 Other specified abnormalities of plasma proteins: Secondary | ICD-10-CM | POA: Diagnosis not present

## 2020-11-06 DIAGNOSIS — I409 Acute myocarditis, unspecified: Secondary | ICD-10-CM | POA: Diagnosis not present

## 2020-11-06 DIAGNOSIS — Z20822 Contact with and (suspected) exposure to covid-19: Secondary | ICD-10-CM | POA: Diagnosis present

## 2020-11-06 DIAGNOSIS — I350 Nonrheumatic aortic (valve) stenosis: Secondary | ICD-10-CM | POA: Diagnosis not present

## 2020-11-06 DIAGNOSIS — I712 Thoracic aortic aneurysm, without rupture: Secondary | ICD-10-CM | POA: Diagnosis not present

## 2020-11-06 LAB — ECHOCARDIOGRAM COMPLETE
AR max vel: 1.22 cm2
AV Area VTI: 1.28 cm2
AV Area mean vel: 1.17 cm2
AV Mean grad: 28.5 mmHg
AV Peak grad: 49.4 mmHg
Ao pk vel: 3.51 m/s
Area-P 1/2: 3.03 cm2
Height: 69.5 in
S' Lateral: 2.3 cm
Weight: 2800 oz

## 2020-11-06 LAB — HEPARIN LEVEL (UNFRACTIONATED)
Heparin Unfractionated: 0.77 IU/mL — ABNORMAL HIGH (ref 0.30–0.70)
Heparin Unfractionated: 0.43 IU/mL (ref 0.30–0.70)

## 2020-11-06 LAB — BASIC METABOLIC PANEL
Anion gap: 8 (ref 5–15)
BUN: 10 mg/dL (ref 6–20)
CO2: 28 mmol/L (ref 22–32)
Calcium: 8.7 mg/dL — ABNORMAL LOW (ref 8.9–10.3)
Chloride: 104 mmol/L (ref 98–111)
Creatinine, Ser: 0.6 mg/dL (ref 0.44–1.00)
GFR, Estimated: >60 mL/min (ref >60)
Glucose, Bld: 110 mg/dL — ABNORMAL HIGH (ref 70–99)
Potassium: 3.9 mmol/L (ref 3.5–5.1)
Sodium: 140 mmol/L (ref 135–145)

## 2020-11-06 LAB — CBC
HCT: 46.9 % — ABNORMAL HIGH (ref 36.0–46.0)
Hemoglobin: 15.6 g/dL — ABNORMAL HIGH (ref 12.0–15.0)
MCH: 31 pg (ref 26.0–34.0)
MCHC: 33.3 g/dL (ref 30.0–36.0)
MCV: 93.1 fL (ref 80.0–100.0)
Platelets: 216 10*3/uL (ref 150–400)
RBC: 5.04 MIL/uL (ref 3.87–5.11)
RDW: 11.8 % (ref 11.5–15.5)
WBC: 7.6 10*3/uL (ref 4.0–10.5)
nRBC: 0 % (ref 0.0–0.2)

## 2020-11-06 LAB — LIPID PANEL
Cholesterol: 223 mg/dL — ABNORMAL HIGH (ref 0–200)
HDL: 58 mg/dL (ref >40)
LDL Cholesterol: 146 mg/dL — ABNORMAL HIGH (ref 0–99)
Total CHOL/HDL Ratio: 3.8 RATIO
Triglycerides: 93 mg/dL (ref <150)
VLDL: 19 mg/dL (ref 0–40)

## 2020-11-06 LAB — TROPONIN I (HIGH SENSITIVITY)
Troponin I (High Sensitivity): 359 ng/L (ref <18)
Troponin I (High Sensitivity): 424 ng/L (ref <18)
Troponin I (High Sensitivity): 536 ng/L (ref <18)

## 2020-11-06 LAB — HEPATIC FUNCTION PANEL
ALT: 20 U/L (ref 0–44)
AST: 26 U/L (ref 15–41)
Albumin: 3.8 g/dL (ref 3.5–5.0)
Alkaline Phosphatase: 58 U/L (ref 38–126)
Bilirubin, Direct: 0.2 mg/dL (ref 0.0–0.2)
Indirect Bilirubin: 0.3 mg/dL (ref 0.3–0.9)
Total Bilirubin: 0.5 mg/dL (ref 0.3–1.2)
Total Protein: 6.5 g/dL (ref 6.5–8.1)

## 2020-11-06 LAB — TSH: TSH: 1.561 u[IU]/mL (ref 0.350–4.500)

## 2020-11-06 LAB — HIV ANTIBODY (ROUTINE TESTING W REFLEX): HIV Screen 4th Generation wRfx: NONREACTIVE

## 2020-11-06 LAB — LIPASE, BLOOD: Lipase: 32 U/L (ref 11–51)

## 2020-11-06 MED ORDER — ASPIRIN EC 81 MG PO TBEC
81.0000 mg | DELAYED_RELEASE_TABLET | Freq: Every day | ORAL | Status: DC
  Administered 2020-11-06 – 2020-11-09 (×4): 81 mg via ORAL
  Filled 2020-11-06 (×4): qty 1

## 2020-11-06 MED ORDER — SODIUM CHLORIDE 0.9% FLUSH
3.0000 mL | Freq: Two times a day (BID) | INTRAVENOUS | Status: DC
  Administered 2020-11-06 – 2020-11-09 (×6): 3 mL via INTRAVENOUS

## 2020-11-06 MED ORDER — ESCITALOPRAM OXALATE 10 MG PO TABS
5.0000 mg | ORAL_TABLET | Freq: Every day | ORAL | Status: DC
  Administered 2020-11-06 – 2020-11-09 (×4): 5 mg via ORAL
  Filled 2020-11-06 (×5): qty 1

## 2020-11-06 MED ORDER — ONDANSETRON HCL 4 MG/2ML IJ SOLN
4.0000 mg | Freq: Four times a day (QID) | INTRAMUSCULAR | Status: DC | PRN
  Administered 2020-11-07 – 2020-11-09 (×4): 4 mg via INTRAVENOUS
  Filled 2020-11-06 (×2): qty 2

## 2020-11-06 MED ORDER — HEPARIN BOLUS VIA INFUSION
4000.0000 [IU] | Freq: Once | INTRAVENOUS | Status: AC
  Administered 2020-11-06: 4000 [IU] via INTRAVENOUS
  Filled 2020-11-06: qty 4000

## 2020-11-06 MED ORDER — ATORVASTATIN CALCIUM 80 MG PO TABS
80.0000 mg | ORAL_TABLET | Freq: Every day | ORAL | Status: DC
  Administered 2020-11-06 – 2020-11-09 (×4): 80 mg via ORAL
  Filled 2020-11-06 (×4): qty 1

## 2020-11-06 MED ORDER — NITROGLYCERIN IN D5W 200-5 MCG/ML-% IV SOLN
0.0000 ug/min | INTRAVENOUS | Status: DC
Start: 2020-11-06 — End: 2020-11-06
  Administered 2020-11-06: 5 ug/min via INTRAVENOUS
  Filled 2020-11-06: qty 250

## 2020-11-06 MED ORDER — METOPROLOL TARTRATE 25 MG PO TABS
25.0000 mg | ORAL_TABLET | Freq: Two times a day (BID) | ORAL | Status: DC
  Administered 2020-11-06 – 2020-11-09 (×7): 25 mg via ORAL
  Filled 2020-11-06 (×7): qty 1

## 2020-11-06 MED ORDER — ONDANSETRON HCL 4 MG/2ML IJ SOLN: 4.0000 mg | Freq: Four times a day (QID) | INTRAMUSCULAR | Status: DC | PRN

## 2020-11-06 MED ORDER — ONDANSETRON HCL 4 MG/2ML IJ SOLN
4.0000 mg | Freq: Once | INTRAMUSCULAR | Status: AC
  Administered 2020-11-06: 4 mg via INTRAVENOUS
  Filled 2020-11-06: qty 2

## 2020-11-06 MED ORDER — IOHEXOL 350 MG/ML SOLN
75.0000 mL | Freq: Once | INTRAVENOUS | Status: AC | PRN
  Administered 2020-11-06: 75 mL via INTRAVENOUS

## 2020-11-06 MED ORDER — HEPARIN (PORCINE) 25000 UT/250ML-% IV SOLN
1250.0000 [IU]/h | INTRAVENOUS | Status: DC
  Administered 2020-11-06: 1100 [IU]/h via INTRAVENOUS
  Filled 2020-11-06: qty 250

## 2020-11-06 MED ORDER — IOHEXOL 350 MG/ML SOLN
100.0000 mL | Freq: Once | INTRAVENOUS | Status: AC | PRN
  Administered 2020-11-06: 100 mL via INTRAVENOUS

## 2020-11-06 MED ORDER — ACETAMINOPHEN 325 MG PO TABS
650.0000 mg | ORAL_TABLET | ORAL | Status: DC | PRN
  Administered 2020-11-06 – 2020-11-09 (×8): 650 mg via ORAL
  Filled 2020-11-06 (×8): qty 2

## 2020-11-06 MED ORDER — SODIUM CHLORIDE 0.45 % IV SOLN: INTRAVENOUS | Status: DC

## 2020-11-06 MED ORDER — GUAIFENESIN ER 600 MG PO TB12
600.0000 mg | ORAL_TABLET | Freq: Two times a day (BID) | ORAL | Status: DC | PRN
  Administered 2020-11-07 – 2020-11-09 (×3): 600 mg via ORAL
  Filled 2020-11-06 (×3): qty 1

## 2020-11-06 MED ORDER — ESTRADIOL 1 MG PO TABS
2.0000 mg | ORAL_TABLET | Freq: Every day | ORAL | Status: DC
  Administered 2020-11-07 – 2020-11-09 (×3): 2 mg via ORAL
  Filled 2020-11-06 (×4): qty 2
  Filled 2020-11-06: qty 1

## 2020-11-06 MED ORDER — ZOLPIDEM TARTRATE 5 MG PO TABS
5.0000 mg | ORAL_TABLET | Freq: Every evening | ORAL | Status: AC | PRN
  Administered 2020-11-06 – 2020-11-07 (×2): 5 mg via ORAL
  Filled 2020-11-06 (×2): qty 1

## 2020-11-06 MED ORDER — ACETAMINOPHEN 325 MG PO TABS: 650.0000 mg | ORAL_TABLET | ORAL | Status: DC | PRN

## 2020-11-06 NOTE — ED Triage Notes (Signed)
Pt reports pain to center of chest that radiates to back since last night.  Pain started while at a wedding rehearsal.  Reports nausea and dizziness since Thursday.  Taking meclizine.  SOB at night.

## 2020-11-06 NOTE — Progress Notes (Signed)
ANTICOAGULATION CONSULT NOTE - Initial Consult  Pharmacy Consult for Heparin Indication: chest pain/ACS  Allergies  Allergen Reactions  . Sulfa Antibiotics     Other reaction(s): RASH    Patient Measurements: Height: 5' 9.5" (176.5 cm) Weight: 79.4 kg (175 lb) IBW/kg (Calculated) : 67.35 Heparin Dosing Weight: 79 kg  Vital Signs: Temp: 98 F (36.7 C) (04/09 1110) Temp Source: Oral (04/09 1110) BP: 133/87 (04/09 1315) Pulse Rate: 80 (04/09 1315)  Labs: Recent Labs    11/06/20 1145  HGB 15.6*  HCT 46.9*  PLT 216  CREATININE 0.60  TROPONINIHS 359*    Estimated Creatinine Clearance: 82.6 mL/min (by C-G formula based on SCr of 0.6 mg/dL).   Medical History: Past Medical History:  Diagnosis Date  . Calcific aortic stenosis of bicuspid valve    2-D echo 02/16/2017: Difficult to see if bicuspid (congenital versus functional) or tricuspid.  thickened, calcified Peak and mean gradients - 45 and 25 mm Hg respectively c/w MODERATE  AS.     Medications:  See electronic med rec  Assessment: 60 y.o. F presents with CP - troponin up to 359. To begin heparin per pharmacy for ACS. No AC PTA. CBC stable.  Goal of Therapy:  Heparin level 0.3-0.7 units/ml Monitor platelets by anticoagulation protocol: Yes   Plan:  Heparin IV bolus 4000 units Heparin gtt at 1100 units/hr Will f/u heparin level in 6 hours Daily heparin level and CBC  Sherlon Handing, PharmD, BCPS Please see amion for complete clinical pharmacist phone list 11/06/2020,1:34 PM

## 2020-11-06 NOTE — ED Notes (Signed)
MD at bedside. 

## 2020-11-06 NOTE — ED Notes (Signed)
Report given to Alfredo Martinez, Bertrand RN

## 2020-11-06 NOTE — Progress Notes (Signed)
  Echocardiogram 2D Echocardiogram has been performed.  Olivia Mcgrath 11/06/2020, 3:46 PM

## 2020-11-06 NOTE — ED Notes (Signed)
Patient transported to X-ray 

## 2020-11-06 NOTE — ED Provider Notes (Addendum)
[facility] Provider Note   CSN: [id number] Arrival date & time: 11/06/20  1104     History Chief Complaint  Patient presents with  . Chest Pain    Olivia Mcgrath is a 60 y.o. female.  HPI  HPI: A 60 year old patient presents for evaluation of chest pain. Initial onset of pain was more than 6 hours ago. The patient's chest pain is described as heaviness/pressure/tightness and is not worse with exertion. The patient complains of nausea. The patient's chest pain is middle- or left-sided, is not well-localized, is not sharp and does not radiate to the arms/jaw/neck. The patient denies diaphoresis. The patient has no history of stroke, has no history of peripheral artery disease, has not smoked in the past 90 days, denies any history of treated diabetes, has no relevant family history of coronary artery disease (first degree relative at less than age 47), is not hypertensive, has no history of hypercholesterolemia and does not have an elevated BMI (>=30).   Pain at 8 /5 at worst, now 5/5, took tums with ? Relief History of bicuspid valve   Past Medical History:  Diagnosis Date  . Calcific aortic stenosis of bicuspid valve    2-D echo 02/16/2017: Difficult to see if bicuspid (congenital versus functional) or tricuspid.  thickened, calcified Peak and mean gradients - 45 and 25 mm Hg respectively c/w MODERATE  AS.     Patient Active Problem List   Diagnosis Date Noted  . Dyslipidemia, goal LDL below 100 09/29/2016  . Aortic stenosis due to bicuspid aortic valve     Past Surgical History:  Procedure Laterality Date  . ABDOMINAL HYSTERECTOMY  11/2008  . LAPAROSCOPIC CHOLECYSTECTOMY  05/2016  . TRANSTHORACIC ECHOCARDIOGRAM  03/2016   Bicuspid aortic valve with no regurgitation. Mild thickening with moderate calcification. Mild to moderate stenosis (mean gradient 28 mmHg, peak gradient 40.4 mmHg). Mild concentric LVH with EF 55-60%. Normal diastolic  filling  . TRANSTHORACIC ECHOCARDIOGRAM  06/2019    Left ventricle: The cavity size was  normal. Wall thickness was normal. Systolic function was normal. EF 55% to 60%. Normal Diastolic parameters. - Aortic valve: AV is diffiicult to see well It is thickened, calcified Peak and mean gradients through the valve are 45 and 25 mm Hg respectively consistent with moderate AS. Mean gradient (S): 26 mm Hg. Peak gradient (S): 43 mm Hg. Mild MR  . TRANSTHORACIC ECHOCARDIOGRAM  11/2019   EF 60 to 65%.  GR 1 DD.  No R WMA.  Mild LA dilation.  Stable moderate-severe AS (mean gradient roughly 30 mmHg with peak 53 mmHg) stable.  Okay to follow-up in 1 year.     OB History   No obstetric history on file.     Family History  Problem Relation Age of Onset  . Rheum arthritis Mother        Age 22 (2018)  . Non-Hodgkin's lymphoma Mother   . Hypertension Mother   . Hyperlipidemia Mother   . Lung cancer Father   . Hypertension Father   . Hyperlipidemia Father   . Rheum arthritis Brother        91 ((2018  . Cancer Maternal Grandmother   . Atrial fibrillation Maternal Grandmother   . Cancer Paternal Grandmother   . Heart disease Paternal Grandmother   . Cancer Paternal Grandfather     Social History   Tobacco Use  . Smoking status: Never Smoker  . Smokeless tobacco: Never Used  Substance Use Topics  .  Alcohol use: Yes    Alcohol/week: 1.0 standard drink    Types: 1 Glasses of wine per week  . Drug use: No    Home Medications Prior to Admission medications   Medication Sig Start Date End Date Taking? Authorizing Provider  escitalopram (LEXAPRO) 10 MG tablet Take 5 mg by mouth daily. 06/15/16   [provider]  estradiol (ESTRACE) 2 MG tablet Take 2 mg by mouth daily.    [provider]  guaiFENesin (MUCINEX) 600 MG 12 hr tablet Take 600 mg by mouth as needed.    [provider]    Allergies    Sulfa antibiotics  Review of Systems   Review of Systems  All  other systems reviewed and are negative.   Physical Exam Updated Vital Signs BP 122/90 (BP Location: Left Arm)   Pulse 96   Temp 98 F (36.7 C) (Oral)   Resp (!) 22   SpO2 97%   Physical Exam Vitals and nursing note reviewed.  HENT:     Head: Normocephalic.  Eyes:     Pupils: Pupils are equal, round, and reactive to light.  Cardiovascular:     Rate and Rhythm: Normal rate and regular rhythm.     Heart sounds: Murmur heard.   Systolic murmur is present with a grade of 4/6.   Pulmonary:     Effort: Pulmonary effort is normal.     Breath sounds: Normal breath sounds.  Chest:     Chest wall: No mass or deformity.  Abdominal:     General: Bowel sounds are normal.     Palpations: Abdomen is soft.  Musculoskeletal:        General: Normal range of motion.     Cervical back: Normal range of motion.  Skin:    General: Skin is warm.     Capillary Refill: Capillary refill takes less than 2 seconds.  Neurological:     General: No focal deficit present.     Mental Status: She is alert.     ED Results / Procedures / Treatments   Labs (all labs ordered are listed, but only abnormal results are displayed) Labs Reviewed  BASIC METABOLIC PANEL  CBC  TROPONIN I (HIGH SENSITIVITY)    EKG EKG Interpretation  Date/Time:  Saturday November 06 2020 11:15:59 EDT Ventricular Rate:  94 PR Interval:  170 QRS Duration: 92 QT Interval:  362 QTC Calculation: 452 R Axis:   -64 Text Interpretation: Normal sinus rhythm Left anterior fascicular block Abnormal ECG No old tracing to compare Confirmed by Pattricia Boss [phone]) on 11/06/2020 11:41:09 AM   Radiology DG Chest 2 View  Result Date: 11/06/2020 CLINICAL DATA:  Chest pain and shortness of breath. EXAM: CHEST - 2 VIEW COMPARISON:  Chest x-Gudrun Axe dated March 13, 2020. FINDINGS: The heart size and mediastinal contours are within normal limits. Both lungs are clear. The visualized skeletal structures are unremarkable. IMPRESSION: No active  cardiopulmonary disease. Electronically Signed   By: Titus Dubin M.D.   On: 11/06/2020 12:29   CT Angio Chest PE W and/or Wo Contrast  Result Date: 11/06/2020 CLINICAL DATA:  Central chest pain radiating to the back. Positive D-dimer. EXAM: CT ANGIOGRAPHY CHEST WITH CONTRAST TECHNIQUE: Multidetector CT imaging of the chest was performed using the standard protocol during bolus administration of intravenous contrast. Multiplanar CT image reconstructions and MIPs were obtained to evaluate the vascular anatomy. CONTRAST:  91mL OMNIPAQUE IOHEXOL 350 MG/ML SOLN COMPARISON:  None. FINDINGS: Cardiovascular: Satisfactory opacification of  the pulmonary arteries to the segmental level. No evidence of pulmonary embolism. Normal heart size. No pericardial effusion. There is a fusiform aneurysmal dilation of the ascending aorta, from the root of the aorta to the aortic arch. The ascending aorta measures 4.1 cm. The aortic arch measures 3.5 cm. The descending aorta reverts to normal caliber measuring 2.8 cm. There is bulbous appearance of the aortic valve annulus, which may be involved, not well visualized. Mediastinum/Nodes: No enlarged mediastinal, hilar, or axillary lymph nodes. Thyroid gland, trachea, and esophagus demonstrate no significant findings. Lungs/Pleura: Wispy symmetric opacities in the dependent portions of the lungs are suggestive of mild interstitial pulmonary edema. 4 mm perifissural right middle lobe soft tissue nodule. 2 mm subpleural right middle lobe soft tissue nodule is stable from 2013, benign. Upper Abdomen: No acute abnormality. Musculoskeletal: No chest wall abnormality. No acute or significant osseous findings. Review of the MIP images confirms the above findings. IMPRESSION: 1. No evidence of pulmonary embolus. 2. 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 The ascending aorta measures 4.1 cm. 3. Bulbous appearance of the aortic valve  annulus, which may be involved, not well visualized. Further evaluation with cardiac echo may be considered. 4. Wispy symmetric opacities in the dependent portions of the lungs are suggestive of mild interstitial pulmonary edema. 5. Cardiology and cardiothoracic surgery consultation is recommended. 6. 4 mm peri fissural right middle lobe soft tissue nodule. No follow-up needed if patient is low-risk. Non-contrast chest CT can be considered in 12 months if patient is high-risk. This recommendation follows the consensus statement: Guidelines for Management of Incidental Pulmonary Nodules Detected on CT Images: From the Fleischner Society 2017; Radiology 2017; 284:228-243. 7. These results were called by telephone at the time of interpretation on 11/06/2020 at 2:21 pm to Dr. Pattricia Boss , who verbally acknowledged these results. Electronically Signed   By: Fidela Salisbury M.D.   On: 11/06/2020 14:23    Procedures .Critical Care Performed by: Pattricia Boss, MD Authorized by: Pattricia Boss, MD   Critical care provider statement:    Critical care time (minutes):  45   Critical care end time:  11/06/2020 2:20 PM   Critical care was necessary to treat or prevent imminent or life-threatening deterioration of the following conditions:  Cardiac failure and circulatory failure   Critical care was time spent personally by me on the following activities:  Discussions with consultants, evaluation of patient's response to treatment, examination of patient, ordering and performing treatments and interventions, ordering and review of laboratory studies, ordering and review of radiographic studies, pulse oximetry, re-evaluation of patient's condition, obtaining history from patient or surrogate and review of old charts     Medications Ordered in ED Medications - No data to display  ED Course  I have reviewed the triage vital signs and the nursing notes.  Pertinent labs & imaging results that were available  during my care of the patient were reviewed by me and considered in my medical decision making (see chart for details). First troponin reported positive at 359 IV heparin and nitro ordered CTA pending Cardiology paged Discussed with Dr. Johnsie Cancel they will be in to evaluate  CTA results pending. Dr. Johnsie Cancel has seen patient and nursing informs me that patient has a bed assignment 2:25 PM Received call from radiologist with concern for acute aneurysmal fusiform dilation MRA and ascending aorta to 4.1.  She states there is some mild pulmonary edema. Page placed to  cardiology and cardiothoracic surgery Patient reevaluated.  Pulses are equal.  There is no neurological deficit.  Blood pressure is 133/87 at this time. Discussed results with Dr. Nolon Lennert and he reviewed CTA.  He feels that this is consistent with patient's bicuspid valve.  He does feel comfortable with continuing the heparin and not consulting cardiothoracic.  Cardiothoracic consult canceled. MDM Rules/Calculators/A&P HEAR Score: 3                         Final Clinical Impression(s) / ED Diagnoses Final diagnoses:  NSTEMI (non-ST elevated myocardial infarction) [facility])    Rx / [place] Orders ED Discharge Orders    None       Pattricia Boss, MD 11/06/20 1419    Pattricia Boss, MD 11/06/20 1432

## 2020-11-06 NOTE — ED Notes (Signed)
Second IV site insertion unsuccessful x 2

## 2020-11-06 NOTE — Progress Notes (Signed)
[place] for Heparin Indication: chest pain/ACS  Allergies  Allergen Reactions  . Sulfa Antibiotics Rash    Patient Measurements: Height: 5' 9.5" (176.5 cm) Weight: 79.4 kg (175 lb) IBW/kg (Calculated) : 67.35 Heparin Dosing Weight: 79 kg  Vital Signs: Temp: 98.2 F (36.8 C) (04/09 2021) Temp Source: Oral (04/09 2021) BP: 113/84 (04/09 2129) Pulse Rate: 69 (04/09 2129)  Labs: Recent Labs    11/06/20 1145 11/06/20 1251 11/06/20 1557 11/06/20 1614 11/06/20 2154  HGB 15.6*  --   --   --   --   HCT 46.9*  --   --   --   --   PLT 216  --   --   --   --   HEPARINUNFRC  --   --   --  0.77* 0.43  CREATININE 0.60  --   --   --   --   TROPONINIHS 359* 424* 536*  --   --     Estimated Creatinine Clearance: 82.6 mL/min (by C-G formula based on SCr of 0.6 mg/dL).   Medical History: Past Medical History:  Diagnosis Date  . Calcific aortic stenosis of bicuspid valve    2-D echo 02/16/2017: Difficult to see if bicuspid (congenital versus functional) or tricuspid.  thickened, calcified Peak and mean gradients - 45 and 25 mm Hg respectively c/w MODERATE  AS.      Assessment: 60 y.o. F presents with CP - troponin up to 359. To begin heparin per pharmacy for ACS. No AC PTA. CBC stable.  Initial heparin level therapeutic at 0.43.  Goal of Therapy:  Heparin level 0.3-0.7 units/ml Monitor platelets by anticoagulation protocol: Yes   Plan:  Continue heparin 1100 units/h Daily heparin level and CBC   Arrie Senate, PharmD, [place], [facility] Clinical Pharmacist [phone] Please check AMION for all [facility] numbers 11/06/2020

## 2020-11-06 NOTE — Progress Notes (Addendum)
I reviewed patients CT Unfortunately it was ordered as a PE study and not gated She has known aortic root dilatation and I measure 3.90 cm at sinus And root. Radiologist ? Area of annular disruption but I don't see this  And there is no pericardial effusion   Will order stat echo since area of concern is visible in echo field If we need to repeat appropriate CTA gated cardiac we can   Patient is pain free comfortable with stable hemodynamics   I reviewed the images with Dr Purcell Nails from Radiology and  He agreed that there did not appear to be any acute aortic pathology   Jenkins Rouge MD [facility]

## 2020-11-06 NOTE — ED Notes (Signed)
Verbal order from Dr. Jeanell Sparrow to continue with heparin infusion at this time.

## 2020-11-06 NOTE — ED Notes (Signed)
Pt ambulatory to restroom

## 2020-11-06 NOTE — H&P (Signed)
Physician History and Physical     Patient ID: Olivia Mcgrath MRN: [id number] DOB/AGE: Dec 23, 1960 60 y.o. Admit date: 11/06/2020  Primary Care Physician: Jene Every, MD Primary Cardiologist: Ellyn Hack  Active Problems:   Chest pain   HPI:  60 y.o. with known bicuspid AV alst echo May 2021 with moderate to severe AS mean gradient 30 mmHg AVA 0.7 cm2 and DVI 0.21.  Normal EF and aortic root 3.9 cm Has had two bouts of COVID one November one 3 months ago. Has not felt well since Wednesday  Had pre syncope at work. 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 Continue to have pain this am and came to ER Currently pain free. ECG non acute But Troponin 359->424.  CXR with normal mediastinum and NAD. CTA PE protocol ordered by ER doctor no PE and aortic root 3.9 cm  Review of systems complete and found to be negative unless listed above   Past Medical History:  Diagnosis Date  . Calcific aortic stenosis of bicuspid valve    2-D echo 02/16/2017: Difficult to see if bicuspid (congenital versus functional) or tricuspid.  thickened, calcified Peak and mean gradients - 45 and 25 mm Hg respectively c/w MODERATE  AS.     Family History  Problem Relation Age of Onset  . Rheum arthritis Mother        Age 47 (2018)  . Non-Hodgkin's lymphoma Mother   . Hypertension Mother   . Hyperlipidemia Mother   . Lung cancer Father   . Hypertension Father   . Hyperlipidemia Father   . Rheum arthritis Brother        86 ((2018  . Cancer Maternal Grandmother   . Atrial fibrillation Maternal Grandmother   . Cancer Paternal Grandmother   . Heart disease Paternal Grandmother   . Cancer Paternal Grandfather     Social History   Socioeconomic History  . Marital status: Married    Spouse name: Not on file  . Number of children: 2  . Years of education: 73  . Highest education level: Not on  file  Occupational History  . 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 Smoking status: Never Smoker  . Smokeless tobacco: Never Used  Substance and Sexual Activity  . Alcohol use: Yes    Alcohol/week: 1.0 standard drink    Types: 1 Glasses of wine per week  . Drug use: No  . Sexual activity: Yes  Other Topics Concern  . Not on file  Social History Narrative   Lives with husband  2 children.   Daughter is Therapist, sports on [employer] @ [employer].   Limited exercise - occasional walks up to 2 miles - tries to walk~3-4/wek.   Social Determinants of Health   Financial Resource Strain: Not on file  Food Insecurity: Not on file  Transportation Needs: Not on file  Physical Activity: Not on file  Stress: Not on file  Social Connections: Not on file  Intimate Partner Violence: Not on file    Past Surgical History:  Procedure Laterality Date  . ABDOMINAL HYSTERECTOMY  11/2008  . LAPAROSCOPIC CHOLECYSTECTOMY  05/2016  . TRANSTHORACIC ECHOCARDIOGRAM  03/2016   Bicuspid aortic valve with no regurgitation. Mild thickening with moderate calcification. Mild to moderate stenosis (mean gradient 28 mmHg, peak gradient 40.4 mmHg). Mild concentric LVH  with EF 55-60%. Normal diastolic filling  . TRANSTHORACIC ECHOCARDIOGRAM  06/2019    Left ventricle: The cavity size was  normal. Wall thickness was normal. Systolic function was normal. EF 55% to 60%. Normal Diastolic parameters. - Aortic valve: AV is diffiicult to see well It is thickened, calcified Peak and mean gradients through the valve are 45 and 25 mm Hg respectively consistent with moderate AS. Mean gradient (S): 26 mm Hg. Peak gradient (S): 43 mm Hg. Mild MR  . TRANSTHORACIC ECHOCARDIOGRAM  11/2019   EF 60 to 65%.  GR 1 DD.  No R WMA.  Mild LA dilation.  Stable moderate-severe AS (mean gradient roughly 30 mmHg with peak 53 mmHg) stable.  Okay to follow-up in 1 year.     (Not in a hospital admission)   Physical Exam: Blood  pressure 133/87, pulse 80, temperature 98 F (36.7 C), temperature source Oral, resp. rate 14, height 5' 9.5" (1.765 m), weight 79.4 kg, SpO2 99 %. Affect appropriate Healthy:  appears stated age 24: normal Neck supple with no adenopathy JVP normal no bruits no thyromegaly Lungs clear with no wheezing and good diaphragmatic motion Heart:  S1/S2 diminished with significant AS  murmur, no rub, gallop or click PMI normal Abdomen: benighn, BS positve, no tenderness, no AAA no bruit.  No HSM or HJR Distal pulses intact with no bruits No edema Neuro non-focal Skin warm and dry No muscular weakness  No current facility-administered medications on file prior to encounter.   Current Outpatient Medications on File Prior to Encounter  Medication Sig Dispense Refill  . escitalopram (LEXAPRO) 10 MG tablet Take 5 mg by mouth daily.    Marland Kitchen estradiol (ESTRACE) 2 MG tablet Take 2 mg by mouth daily.    Marland Kitchen guaiFENesin (MUCINEX) 600 MG 12 hr tablet Take 600 mg by mouth as needed.      Labs:   Lab Results  Component Value Date   WBC 7.6 11/06/2020   HGB 15.6 (H) 11/06/2020   HCT 46.9 (H) 11/06/2020   MCV 93.1 11/06/2020   PLT 216 11/06/2020    Recent Labs  Lab 11/06/20 1145  NA 140  K 3.9  CL 104  CO2 28  BUN 10  CREATININE 0.60  CALCIUM 8.7*  PROT 6.5  BILITOT 0.5  ALKPHOS 58  ALT 20  AST 26  GLUCOSE 110*   No results found for: CKTOTAL, CKMB, CKMBINDEX, TROPONINI   Lab Results  Component Value Date   CHOL 210 (H) 02/27/2020   Lab Results  Component Value Date   HDL 64 02/27/2020   Lab Results  Component Value Date   LDLCALC 126 (H) 02/27/2020   Lab Results  Component Value Date   TRIG 116 02/27/2020   Lab Results  Component Value Date   CHOLHDL 3.3 02/27/2020   No results found for: LDLDIRECT     Radiology: DG Chest 2 View  Result Date: 11/06/2020 CLINICAL DATA:  Chest pain and shortness of breath. EXAM: CHEST - 2 VIEW COMPARISON:  Chest x-ray dated  March 13, 2020. FINDINGS: The heart size and mediastinal contours are within normal limits. Both lungs are clear. The visualized skeletal structures are unremarkable. IMPRESSION: No active cardiopulmonary disease. Electronically Signed   By: Titus Dubin M.D.   On: 11/06/2020 12:29    EKG: SR LAFB no acute ST changes   ASSESSMENT AND PLAN:   1. Chest pain: with positive troponins no acute ECG changes. Given significant valve disease favor admission and  right / left cath Monday. CXR no effusion or mediastinal widening and aortic root only 3.9 cm on echo 11/2019 Will review CTA when available Outside chance of coronary dissection but ER doctor has already started patient on heparin / nitro. She is pain free now.   2. AS:  In setting of bicuspid AV rvjp 12/19/19 mean gradient only 30 peak 53 but AVA 0.7 and DVI 0.21 update echo in hospital  3. Aorta:  Reviewed CTA aortic root 3.9 cm no dissection    Signed: Collier Salina Nishan4/03/2021, 2:06 PM

## 2020-11-06 NOTE — Plan of Care (Signed)
  Problem: Clinical Measurements: Goal: Ability to maintain clinical measurements within normal limits will improve Outcome: Progressing Goal: Cardiovascular complication will be avoided Outcome: Progressing   

## 2020-11-06 NOTE — ED Notes (Signed)
Dr. Jeanell Sparrow informed of patient's critical troponin of 359 via face to face communication. No new verbal orders received.

## 2020-11-06 NOTE — ED Notes (Signed)
Dr Ray at bedside. 

## 2020-11-07 DIAGNOSIS — R079 Chest pain, unspecified: Secondary | ICD-10-CM

## 2020-11-07 DIAGNOSIS — R778 Other specified abnormalities of plasma proteins: Secondary | ICD-10-CM

## 2020-11-07 DIAGNOSIS — Q231 Congenital insufficiency of aortic valve: Secondary | ICD-10-CM

## 2020-11-07 LAB — CBC
HCT: 41.6 % (ref 36.0–46.0)
Hemoglobin: 14 g/dL (ref 12.0–15.0)
MCH: 31.2 pg (ref 26.0–34.0)
MCHC: 33.7 g/dL (ref 30.0–36.0)
MCV: 92.7 fL (ref 80.0–100.0)
Platelets: 196 10*3/uL (ref 150–400)
RBC: 4.49 MIL/uL (ref 3.87–5.11)
RDW: 11.9 % (ref 11.5–15.5)
WBC: 5.1 10*3/uL (ref 4.0–10.5)
nRBC: 0 % (ref 0.0–0.2)

## 2020-11-07 LAB — HEMOGLOBIN A1C
Hgb A1c MFr Bld: 5.2 % (ref 4.8–5.6)
Mean Plasma Glucose: 102.54 mg/dL

## 2020-11-07 LAB — BASIC METABOLIC PANEL
Anion gap: 8 (ref 5–15)
BUN: 10 mg/dL (ref 6–20)
CO2: 27 mmol/L (ref 22–32)
Calcium: 8.2 mg/dL — ABNORMAL LOW (ref 8.9–10.3)
Chloride: 106 mmol/L (ref 98–111)
Creatinine, Ser: 0.59 mg/dL (ref 0.44–1.00)
GFR, Estimated: >60 mL/min (ref >60)
Glucose, Bld: 120 mg/dL — ABNORMAL HIGH (ref 70–99)
Potassium: 3.3 mmol/L — ABNORMAL LOW (ref 3.5–5.1)
Sodium: 141 mmol/L (ref 135–145)

## 2020-11-07 LAB — HEPARIN LEVEL (UNFRACTIONATED): Heparin Unfractionated: 0.26 IU/mL — ABNORMAL LOW (ref 0.30–0.70)

## 2020-11-07 LAB — SARS CORONAVIRUS 2 (TAT 6-24 HRS): SARS Coronavirus 2: NEGATIVE

## 2020-11-07 MED ORDER — ASPIRIN 81 MG PO CHEW
81.0000 mg | CHEWABLE_TABLET | ORAL | Status: AC
  Administered 2020-11-08: 81 mg via ORAL
  Filled 2020-11-07: qty 1

## 2020-11-07 MED ORDER — SODIUM CHLORIDE 0.9 % WEIGHT BASED INFUSION: 1.0000 mL/kg/h | INTRAVENOUS | Status: DC

## 2020-11-07 MED ORDER — SODIUM CHLORIDE 0.9 % WEIGHT BASED INFUSION
3.0000 mL/kg/h | INTRAVENOUS | Status: DC
  Administered 2020-11-08: 3 mL/kg/h via INTRAVENOUS

## 2020-11-07 MED ORDER — SODIUM CHLORIDE 0.9% FLUSH: 3.0000 mL | INTRAVENOUS | Status: DC | PRN

## 2020-11-07 MED ORDER — POTASSIUM CHLORIDE ER 10 MEQ PO TBCR
40.0000 meq | EXTENDED_RELEASE_TABLET | Freq: Once | ORAL | Status: AC
  Administered 2020-11-07: 40 meq via ORAL
  Filled 2020-11-07 (×2): qty 4

## 2020-11-07 MED ORDER — SODIUM CHLORIDE 0.9 % IV SOLN: 250.0000 mL | INTRAVENOUS | Status: DC | PRN

## 2020-11-07 NOTE — H&P (View-Only) (Signed)
Subjective:  Denies SSCP, palpitations or Dyspnea GI distress also seems to have passed   Objective:  Vitals:   11/06/20 2021 11/06/20 2129 11/07/20 0014 11/07/20 0440  BP: 109/69 113/84 106/72 114/81  Pulse: 67 69 62 63  Resp: 17  16 18   Temp: 98.2 F (36.8 C)  98.2 F (36.8 C) 98.1 F (36.7 C)  TempSrc: Oral  Oral Oral  SpO2: 94%  95% 97%  Weight:    80.8 kg  Height:        Intake/Output from previous day:  Intake/Output Summary (Last 24 hours) at 11/07/2020 0933 Last data filed at 11/07/2020 3903 Gross per 24 hour  Intake 563.75 ml  Output --  Net 563.75 ml    Physical Exam: Affect appropriate Healthy:  appears stated age HEENT: normal Neck supple with no adenopathy JVP normal no bruits no thyromegaly Lungs clear with no wheezing and good diaphragmatic motion Heart:  S1/S2 AS murmur 4/6  murmur, no rub, gallop or click PMI normal Abdomen: benighn, BS positve, no tenderness, no AAA no bruit.  No HSM or HJR Distal pulses intact with no bruits No edema Neuro non-focal Skin warm and dry No muscular weakness   Lab Results: Basic Metabolic Panel: Recent Labs    11/06/20 1145 11/07/20 0118  NA 140 141  K 3.9 3.3*  CL 104 106  CO2 28 27  GLUCOSE 110* 120*  BUN 10 10  CREATININE 0.60 0.59  CALCIUM 8.7* 8.2*   Liver Function Tests: Recent Labs    11/06/20 1145  AST 26  ALT 20  ALKPHOS 58  BILITOT 0.5  PROT 6.5  ALBUMIN 3.8   Recent Labs    11/06/20 1145  LIPASE 32   CBC: Recent Labs    11/06/20 1145 11/07/20 0118  WBC 7.6 5.1  HGB 15.6* 14.0  HCT 46.9* 41.6  MCV 93.1 92.7  PLT 216 196   Cardiac Enzymes: No results for input(s): CKTOTAL, CKMB, CKMBINDEX, TROPONINI in the last 72 hours. BNP: Invalid input(s): POCBNP D-Dimer: No results for input(s): DDIMER in the last 72 hours. Hemoglobin A1C: Recent Labs    11/07/20 0118  HGBA1C 5.2   Fasting Lipid Panel: Recent Labs    11/06/20 1557  CHOL 223*  HDL 58  LDLCALC  146*  TRIG 93  CHOLHDL 3.8   Thyroid Function Tests: Recent Labs    11/06/20 1557  TSH 1.561   Anemia Panel: No results for input(s): VITAMINB12, FOLATE, FERRITIN, TIBC, IRON, RETICCTPCT in the last 72 hours.  Imaging: DG Chest 2 View  Result Date: 11/06/2020 CLINICAL DATA:  Chest pain and shortness of breath. EXAM: CHEST - 2 VIEW COMPARISON:  Chest x-ray dated March 13, 2020. FINDINGS: The heart size and mediastinal contours are within normal limits. Both lungs are clear. The visualized skeletal structures are unremarkable. IMPRESSION: No active cardiopulmonary disease. Electronically Signed   By: Titus Dubin M.D.   On: 11/06/2020 12:29   CT Angio Chest PE W and/or Wo Contrast  Result Date: 11/06/2020 CLINICAL DATA:  Central chest pain radiating to the back. Positive D-dimer. EXAM: CT ANGIOGRAPHY CHEST WITH CONTRAST TECHNIQUE: Multidetector CT imaging of the chest was performed using the standard protocol during bolus administration of intravenous contrast. Multiplanar CT image reconstructions and MIPs were obtained to evaluate the vascular anatomy. CONTRAST:  42mL OMNIPAQUE IOHEXOL 350 MG/ML SOLN COMPARISON:  None. FINDINGS: Cardiovascular: Satisfactory opacification of the pulmonary arteries to the segmental level. No evidence of pulmonary embolism. Normal  heart size. No pericardial effusion. There is a fusiform aneurysmal dilation of the ascending aorta, from the root of the aorta to the aortic arch. The ascending aorta measures 4.1 cm. The aortic arch measures 3.5 cm. The descending aorta reverts to normal caliber measuring 2.8 cm. There is bulbous appearance of the aortic valve annulus, which may be involved, not well visualized. Mediastinum/Nodes: No enlarged mediastinal, hilar, or axillary lymph nodes. Thyroid gland, trachea, and esophagus demonstrate no significant findings. Lungs/Pleura: Wispy symmetric opacities in the dependent portions of the lungs are suggestive of mild  interstitial pulmonary edema. 4 mm perifissural right middle lobe soft tissue nodule. 2 mm subpleural right middle lobe soft tissue nodule is stable from 2013, benign. Upper Abdomen: No acute abnormality. Musculoskeletal: No chest wall abnormality. No acute or significant osseous findings. Review of the MIP images confirms the above findings. IMPRESSION: 1. No evidence of pulmonary embolus. 2. 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 The ascending aorta measures 4.1 cm. 3. Bulbous appearance of the aortic valve annulus, which may be involved, not well visualized. Further evaluation with cardiac echo may be considered. 4. Wispy symmetric opacities in the dependent portions of the lungs are suggestive of mild interstitial pulmonary edema. 5. Cardiology and cardiothoracic surgery consultation is recommended. 6. 4 mm peri fissural right middle lobe soft tissue nodule. No follow-up needed if patient is low-risk. Non-contrast chest CT can be considered in 12 months if patient is high-risk. This recommendation follows the consensus statement: Guidelines for Management of Incidental Pulmonary Nodules Detected on CT Images: From the Fleischner Society 2017; Radiology 2017; 284:228-243. 7. These results were called by telephone at the time of interpretation on 11/06/2020 at 2:21 pm to Dr. Pattricia Boss , who verbally acknowledged these results. Electronically Signed   By: Fidela Salisbury M.D.   On: 11/06/2020 14:23   ECHOCARDIOGRAM COMPLETE  Result Date: 11/06/2020    ECHOCARDIOGRAM REPORT   Patient Name:   Olivia Mcgrath Date of Exam: 11/06/2020 Medical Rec #:  [id number]    Height:       69.5 in Accession #:    [id number]   Weight:       175.0 lb Date of Birth:  Dec 23, 1960    BSA:          1.962 m Patient Age:    60 years     BP:           120/80 mmHg Patient Gender: F            HR:           77 bpm. Exam Location:  Inpatient Procedure: 2D Echo Indications:     R07.9* Chest pain, unspecified  History:        Patient has prior history of Echocardiogram examinations. Aortic                 Valve Disease.  Sonographer:    Dustin Flock Referring Phys: [place]  1. Left ventricular ejection fraction, by estimation, is 60 to 65%. The left ventricle has normal function. The left ventricle has no regional wall motion abnormalities. The left ventricular internal cavity size was mildly dilated. Left ventricular diastolic parameters are consistent with Grade I diastolic dysfunction (impaired relaxation).  2. Right ventricular systolic function is normal. The right ventricular size is normal.  3. Left atrial size was mildly dilated.  4. The mitral valve is normal in structure.  Trivial mitral valve regurgitation. No evidence of mitral stenosis.  5. Not well visualized but appears / known to be bicusid mean gradient stable 28.5 mmHg was 30 mmHg May 2021 However AVA/DVI more consistent now with moderate and not severe AS AVA 1.22 cm2 and DVI 0.31 . The aortic valve is bicuspid. There is moderate calcification of the aortic valve. There is moderate thickening of the aortic valve. Aortic valve regurgitation is not visualized. Moderate to severe aortic valve stenosis.  6. May 2021 previous aortic root measured 3.9 only 3.5 on current study No evidence of aortic dissection, Sinus of valsalva aneurysm or other root pathology No AR and no pericardial effusion.  7. The inferior vena cava is normal in size with greater than 50% respiratory variability, suggesting right atrial pressure of 3 mmHg. FINDINGS  Left Ventricle: Left ventricular ejection fraction, by estimation, is 60 to 65%. The left ventricle has normal function. The left ventricle has no regional wall motion abnormalities. The left ventricular internal cavity size was mildly dilated. There is  no left ventricular hypertrophy. Left ventricular diastolic parameters are consistent with Grade I diastolic  dysfunction (impaired relaxation). Right Ventricle: The right ventricular size is normal. No increase in right ventricular wall thickness. Right ventricular systolic function is normal. Left Atrium: Left atrial size was mildly dilated. Right Atrium: Right atrial size was normal in size. Pericardium: There is no evidence of pericardial effusion. Mitral Valve: The mitral valve is normal in structure. There is mild thickening of the mitral valve leaflet(s). Trivial mitral valve regurgitation. No evidence of mitral valve stenosis. Tricuspid Valve: The tricuspid valve is normal in structure. Tricuspid valve regurgitation is not demonstrated. No evidence of tricuspid stenosis. Aortic Valve: Not well visualized but appears / known to be bicusid mean gradient stable 28.5 mmHg was 30 mmHg May 2021 However AVA/DVI more consistent now with moderate and not severe AS AVA 1.22 cm2 and DVI 0.31. The aortic valve is bicuspid. There is moderate calcification of the aortic valve. There is moderate thickening of the aortic valve. There is moderate aortic valve annular calcification. Aortic valve regurgitation is not visualized. Moderate to severe aortic stenosis is present. Aortic valve mean gradient measures 28.5 mmHg. Aortic valve peak gradient measures 49.4 mmHg. Aortic valve area, by VTI measures 1.28 cm. Pulmonic Valve: The pulmonic valve was normal in structure. Pulmonic valve regurgitation is not visualized. No evidence of pulmonic stenosis. Aorta: May 2021 previous aortic root measured 3.9 only 3.5 on current study No evidence of aortic dissection, Sinus of valsalva aneurysm or other root pathology No AR and no pericardial effusion. The aortic root is normal in size and structure. Venous: The inferior vena cava is normal in size with greater than 50% respiratory variability, suggesting right atrial pressure of 3 mmHg. IAS/Shunts: No atrial level shunt detected by color flow Doppler.  LEFT VENTRICLE PLAX 2D LVIDd:          4.70 cm  Diastology LVIDs:         2.30 cm  LV e' medial:    5.77 cm/s LV PW:         1.20 cm  LV E/e' medial:  11.7 LV IVS:        1.10 cm  LV e' lateral:   7.62 cm/s LVOT diam:     2.30 cm  LV E/e' lateral: 8.8 LV SV:         85 LV SV Index:   43 LVOT Area:     4.15 cm  RIGHT VENTRICLE RV Basal diam:  3.50 cm RV S prime:     10.80 cm/s TAPSE (M-mode): 2.2 cm LEFT ATRIUM             Index       RIGHT ATRIUM           Index LA diam:        3.80 cm 1.94 cm/m  RA Area:     15.40 cm LA Vol (A2C):   27.4 ml 13.97 ml/m RA Volume:   37.20 ml  18.96 ml/m LA Vol (A4C):   27.5 ml 14.02 ml/m LA Biplane Vol: 28.2 ml 14.38 ml/m  AORTIC VALVE AV Area (Vmax):    1.22 cm AV Area (Vmean):   1.17 cm AV Area (VTI):     1.28 cm AV Vmax:           351.33 cm/s AV Vmean:          244.000 cm/s AV VTI:            0.667 m AV Peak Grad:      49.4 mmHg AV Mean Grad:      28.5 mmHg LVOT Vmax:         103.00 cm/s LVOT Vmean:        68.700 cm/s LVOT VTI:          0.205 m LVOT/AV VTI ratio: 0.31  AORTA Ao Root diam: 2.90 cm MITRAL VALVE               TRICUSPID VALVE MV Area (PHT): 3.03 cm    TR Peak grad:   21.5 mmHg MV Decel Time: 250 msec    TR Vmax:        232.00 cm/s MV E velocity: 67.30 cm/s MV A velocity: 91.70 cm/s  SHUNTS MV E/A ratio:  0.73        Systemic VTI:  0.20 m                            Systemic Diam: 2.30 cm Jenkins Rouge MD Electronically signed by Jenkins Rouge MD Signature Date/Time: 11/06/2020/3:53:07 PM    Final    CT Angio Chest/Abd/Pel for Dissection W and/or W/WO  Result Date: 11/06/2020 CLINICAL DATA:  60 year old presenting with chest pain radiating into back. Current history of aortic stenosis due to congenital bicuspid valve. EXAM: CT ANGIOGRAPHY CHEST, ABDOMEN AND PELVIS (DISSECTION PROTOCOL) TECHNIQUE: Non-contrast CT of the chest was initially obtained. Multidetector CT imaging through the chest, abdomen and pelvis was performed using the standard protocol during bolus administration of intravenous  contrast. Multiplanar reconstructed images and MIPs were obtained and reviewed to evaluate the vascular anatomy. CONTRAST:  171mL OMNIPAQUE IOHEXOL 350 MG/ML IV. COMPARISON:  CTA chest for pulmonary embolism earlier same day. FINDINGS: CTA CHEST FINDINGS Cardiovascular: No visible calcified plaque on the unenhanced images. Contrast opacification of the systemic arteries is excellent. No evidence of thoracic aortic dissection. 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 Kyla Balzarine note from earlier today). No visible soft plaque within the thoracic aorta. Heart size upper normal to slightly enlarged with evidence of LEFT ventricular hypertrophy. Marked thickening of the aortic valve with associated dense calcification. While not done for coronary artery evaluation, there is calcified and noncalcified plaque in the LAD. No visible coronary atherosclerosis elsewhere. Mediastinum/Nodes: No pathologically enlarged mediastinal, hilar or axillary lymph nodes. No mediastinal masses.  Normal-appearing esophagus. Visualized thyroid gland normal in appearance. Lungs/Pleura: As mentioned on the CT a earlier today, there is a 4 mm perifissural nodule adjacent to the RIGHT major fissure in the RIGHT MIDDLE LOBE. Lung parenchyma otherwise clear apart from dependent atelectasis posteriorly in the lower lobes. Central airways patent without significant bronchial wall thickening. No pleural effusions. Musculoskeletal: Regional skeleton unremarkable without acute or significant osseous abnormality. Review of the MIP images confirms the above findings. CTA ABDOMEN AND PELVIS FINDINGS VASCULAR Aorta: Mild atherosclerosis with calcified and noncalcified plaque distally. No evidence of aneurysm or dissection. 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 SMA: Widely patent without  atherosclerosis. Renals: Single renal arteries bilaterally, widely patent without atherosclerosis. IMA: Widely patent without atherosclerosis. Inflow: Widely patent iliofemoral arteries bilaterally. Mild atherosclerosis involving the distal LEFT common iliac artery. No visible atherosclerosis elsewhere. Veins: Not evaluated. Review of the MIP images confirms the above findings. NON-VASCULAR Hepatobiliary: Liver normal in size and appearance. Surgically absent gallbladder. No unexpected biliary ductal dilation. Pancreas: Normal in appearance without evidence of mass, ductal dilation, or inflammation. Spleen: Normal in size and appearance. Adrenals/Urinary Tract: Normal appearing adrenal glands. Calculi in both kidneys, including 2 calculi in adjacent upper pole calices of the LEFT kidney on the order 2-3 mm, a small 1 2 mm calculus in a mid calyx of the LEFT kidney, a 5 mm calculus in a pole calyx of the LEFT kidney and a small 1-2 mm calculus in a LOWER pole calyx of the RIGHT kidney. No ureteral calculi on either side. No evidence of hydronephrosis. No focal parenchymal abnormality involving either kidney. Normal appearing urinary bladder. Stomach/Bowel: Stomach normal in appearance for the degree of distention. Mild dilation of a few loops of jejunum in the upper abdomen, with associated mild wall thickening, though there is no edema/inflammation in the surrounding mesenteric fat. Remaining small bowel normal in appearance. Moderate stool burden throughout the normal appearing colon. Normal appendix in the RIGHT upper pelvis. Lymphatic: No pathologic lymphadenopathy. Reproductive: Surgically absent uterus.  No adnexal masses. Other: None. Musculoskeletal: Regional skeleton unremarkable without acute or significant osseous abnormality. Review of the MIP images confirms the above findings. IMPRESSION: 1. No evidence of thoracic or abdominal aortic dissection. 2. Ascending thoracic aortic aneurysm measuring  approximately 3.9 cm in diameter (corresponding with the size identified at echocardiography per Dr. Kyla Balzarine note from earlier today). 3. Solitary calcified and noncalcified plaque in the LAD. 4. 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 5. Bilateral non-obstructing renal calculi. 6. Mild dilation of a few loops of jejunum involving the upper abdomen, with associated mild wall thickening, though there is no edema/inflammation in the surrounding mesenteric fat. This may be due to a localized ileus or enteritis. 7. Aortic Atherosclerosis (ICD10-I70.0), mild. I telephoned these results at the time of interpretation to Dr. Johnsie Cancel of cardiology 11/06/2020 at 6:54 p.m. Electronically Signed   By: Evangeline Dakin M.D.   On: 11/06/2020 19:10    Cardiac Studies:  ECG: SR LAD no acute changes    Telemetry:  NSR 11/07/2020   Echo:  Normal EF 60-65% bicuspid AV with mean gradient 28.5 mmHg peak 49.4 mmHg DVI 0.31 AVA 1.22 cm2   No significant change from May 2021   Medications:   . [START ON 11/08/2020] aspirin  81 mg Oral Pre-Cath  . aspirin EC  81 mg Oral Daily  . atorvastatin  80 mg  Oral Daily  . escitalopram  5 mg Oral Daily  . estradiol  2 mg Oral Daily  . metoprolol tartrate  25 mg Oral BID  . sodium chloride flush  3 mL Intravenous Q12H     . sodium chloride 10 mL/hr at 11/07/20 0637  . sodium chloride    . [START ON 11/08/2020] sodium chloride     Followed by  . [START ON 11/08/2020] sodium chloride    . heparin 1,250 Units/hr (11/07/20 0743)    Assessment/Plan:   1. Chest Pain In setting of GI illness and bicuspid AV. She has been chest pain free since admission but despite this Troponin has risen 359->424->536 with no acute ECG changes. I think differential more likely viral myocarditis or coronary dissection. She had some calcific Plaque in her LAD on CTA for dissection protocol but typical large vessel epicardial  MI seems unlikely so will stop heparin She will have Right and left cath tomorrow. Can consider cardiac MRI if cath does not show CAD/coronary dissection Continue ASA/beta blocker and statin   2. AS:  Bicuspid AV stable by TTE done yesterday moderate   3. Aorta:  Radiologist mis-read non gated PE protocol and suggested acute aortic pathology Root only 3.9 cm and f/u echo and appropriate Gated dissection protocol CTA showed no acute aortic pathology and no coarctation   Jenkins Rouge 11/07/2020, 9:33 AM

## 2020-11-07 NOTE — Progress Notes (Signed)
Subjective:  Denies SSCP, palpitations or Dyspnea GI distress also seems to have passed   Objective:  Vitals:   11/06/20 2021 11/06/20 2129 11/07/20 0014 11/07/20 0440  BP: 109/69 113/84 106/72 114/81  Pulse: 67 69 62 63  Resp: 17  16 18   Temp: 98.2 F (36.8 C)  98.2 F (36.8 C) 98.1 F (36.7 C)  TempSrc: Oral  Oral Oral  SpO2: 94%  95% 97%  Weight:    80.8 kg  Height:        Intake/Output from previous day:  Intake/Output Summary (Last 24 hours) at 11/07/2020 0933 Last data filed at 11/07/2020 1761 Gross per 24 hour  Intake 563.75 ml  Output --  Net 563.75 ml    Physical Exam: Affect appropriate Healthy:  appears stated age HEENT: normal Neck supple with no adenopathy JVP normal no bruits no thyromegaly Lungs clear with no wheezing and good diaphragmatic motion Heart:  S1/S2 AS murmur 4/6  murmur, no rub, gallop or click PMI normal Abdomen: benighn, BS positve, no tenderness, no AAA no bruit.  No HSM or HJR Distal pulses intact with no bruits No edema Neuro non-focal Skin warm and dry No muscular weakness   Lab Results: Basic Metabolic Panel: Recent Labs    11/06/20 1145 11/07/20 0118  NA 140 141  K 3.9 3.3*  CL 104 106  CO2 28 27  GLUCOSE 110* 120*  BUN 10 10  CREATININE 0.60 0.59  CALCIUM 8.7* 8.2*   Liver Function Tests: Recent Labs    11/06/20 1145  AST 26  ALT 20  ALKPHOS 58  BILITOT 0.5  PROT 6.5  ALBUMIN 3.8   Recent Labs    11/06/20 1145  LIPASE 32   CBC: Recent Labs    11/06/20 1145 11/07/20 0118  WBC 7.6 5.1  HGB 15.6* 14.0  HCT 46.9* 41.6  MCV 93.1 92.7  PLT 216 196   Cardiac Enzymes: No results for input(s): CKTOTAL, CKMB, CKMBINDEX, TROPONINI in the last 72 hours. BNP: Invalid input(s): POCBNP D-Dimer: No results for input(s): DDIMER in the last 72 hours. Hemoglobin A1C: Recent Labs    11/07/20 0118  HGBA1C 5.2   Fasting Lipid Panel: Recent Labs    11/06/20 1557  CHOL 223*  HDL 58  LDLCALC  146*  TRIG 93  CHOLHDL 3.8   Thyroid Function Tests: Recent Labs    11/06/20 1557  TSH 1.561   Anemia Panel: No results for input(s): VITAMINB12, FOLATE, FERRITIN, TIBC, IRON, RETICCTPCT in the last 72 hours.  Imaging: DG Chest 2 View  Result Date: 11/06/2020 CLINICAL DATA:  Chest pain and shortness of breath. EXAM: CHEST - 2 VIEW COMPARISON:  Chest x-ray dated March 13, 2020. FINDINGS: The heart size and mediastinal contours are within normal limits. Both lungs are clear. The visualized skeletal structures are unremarkable. IMPRESSION: No active cardiopulmonary disease. Electronically Signed   By: Titus Dubin M.D.   On: 11/06/2020 12:29   CT Angio Chest PE W and/or Wo Contrast  Result Date: 11/06/2020 CLINICAL DATA:  Central chest pain radiating to the back. Positive D-dimer. EXAM: CT ANGIOGRAPHY CHEST WITH CONTRAST TECHNIQUE: Multidetector CT imaging of the chest was performed using the standard protocol during bolus administration of intravenous contrast. Multiplanar CT image reconstructions and MIPs were obtained to evaluate the vascular anatomy. CONTRAST:  56mL OMNIPAQUE IOHEXOL 350 MG/ML SOLN COMPARISON:  None. FINDINGS: Cardiovascular: Satisfactory opacification of the pulmonary arteries to the segmental level. No evidence of pulmonary embolism. Normal  heart size. No pericardial effusion. There is a fusiform aneurysmal dilation of the ascending aorta, from the root of the aorta to the aortic arch. The ascending aorta measures 4.1 cm. The aortic arch measures 3.5 cm. The descending aorta reverts to normal caliber measuring 2.8 cm. There is bulbous appearance of the aortic valve annulus, which may be involved, not well visualized. Mediastinum/Nodes: No enlarged mediastinal, hilar, or axillary lymph nodes. Thyroid gland, trachea, and esophagus demonstrate no significant findings. Lungs/Pleura: Wispy symmetric opacities in the dependent portions of the lungs are suggestive of mild  interstitial pulmonary edema. 4 mm perifissural right middle lobe soft tissue nodule. 2 mm subpleural right middle lobe soft tissue nodule is stable from 2013, benign. Upper Abdomen: No acute abnormality. Musculoskeletal: No chest wall abnormality. No acute or significant osseous findings. Review of the MIP images confirms the above findings. IMPRESSION: 1. No evidence of pulmonary embolus. 2. 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 The ascending aorta measures 4.1 cm. 3. Bulbous appearance of the aortic valve annulus, which may be involved, not well visualized. Further evaluation with cardiac echo may be considered. 4. Wispy symmetric opacities in the dependent portions of the lungs are suggestive of mild interstitial pulmonary edema. 5. Cardiology and cardiothoracic surgery consultation is recommended. 6. 4 mm peri fissural right middle lobe soft tissue nodule. No follow-up needed if patient is low-risk. Non-contrast chest CT can be considered in 12 months if patient is high-risk. This recommendation follows the consensus statement: Guidelines for Management of Incidental Pulmonary Nodules Detected on CT Images: From the Fleischner Society 2017; Radiology 2017; 284:228-243. 7. These results were called by telephone at the time of interpretation on 11/06/2020 at 2:21 pm to Dr. Pattricia Boss , who verbally acknowledged these results. Electronically Signed   By: Fidela Salisbury M.D.   On: 11/06/2020 14:23   ECHOCARDIOGRAM COMPLETE  Result Date: 11/06/2020    ECHOCARDIOGRAM REPORT   Patient Name:   DOSHIA DALIA Date of Exam: 11/06/2020 Medical Rec #:  [id number]    Height:       69.5 in Accession #:    [id number]   Weight:       175.0 lb Date of Birth:  Aug 08, 1960    BSA:          1.962 m Patient Age:    60 years     BP:           120/80 mmHg Patient Gender: F            HR:           77 bpm. Exam Location:  Inpatient Procedure: 2D Echo Indications:     R07.9* Chest pain, unspecified  History:        Patient has prior history of Echocardiogram examinations. Aortic                 Valve Disease.  Sonographer:    Dustin Flock Referring Phys: [place]  1. Left ventricular ejection fraction, by estimation, is 60 to 65%. The left ventricle has normal function. The left ventricle has no regional wall motion abnormalities. The left ventricular internal cavity size was mildly dilated. Left ventricular diastolic parameters are consistent with Grade I diastolic dysfunction (impaired relaxation).  2. Right ventricular systolic function is normal. The right ventricular size is normal.  3. Left atrial size was mildly dilated.  4. The mitral valve is normal in structure.  Trivial mitral valve regurgitation. No evidence of mitral stenosis.  5. Not well visualized but appears / known to be bicusid mean gradient stable 28.5 mmHg was 30 mmHg May 2021 However AVA/DVI more consistent now with moderate and not severe AS AVA 1.22 cm2 and DVI 0.31 . The aortic valve is bicuspid. There is moderate calcification of the aortic valve. There is moderate thickening of the aortic valve. Aortic valve regurgitation is not visualized. Moderate to severe aortic valve stenosis.  6. May 2021 previous aortic root measured 3.9 only 3.5 on current study No evidence of aortic dissection, Sinus of valsalva aneurysm or other root pathology No AR and no pericardial effusion.  7. The inferior vena cava is normal in size with greater than 50% respiratory variability, suggesting right atrial pressure of 3 mmHg. FINDINGS  Left Ventricle: Left ventricular ejection fraction, by estimation, is 60 to 65%. The left ventricle has normal function. The left ventricle has no regional wall motion abnormalities. The left ventricular internal cavity size was mildly dilated. There is  no left ventricular hypertrophy. Left ventricular diastolic parameters are consistent with Grade I diastolic  dysfunction (impaired relaxation). Right Ventricle: The right ventricular size is normal. No increase in right ventricular wall thickness. Right ventricular systolic function is normal. Left Atrium: Left atrial size was mildly dilated. Right Atrium: Right atrial size was normal in size. Pericardium: There is no evidence of pericardial effusion. Mitral Valve: The mitral valve is normal in structure. There is mild thickening of the mitral valve leaflet(s). Trivial mitral valve regurgitation. No evidence of mitral valve stenosis. Tricuspid Valve: The tricuspid valve is normal in structure. Tricuspid valve regurgitation is not demonstrated. No evidence of tricuspid stenosis. Aortic Valve: Not well visualized but appears / known to be bicusid mean gradient stable 28.5 mmHg was 30 mmHg May 2021 However AVA/DVI more consistent now with moderate and not severe AS AVA 1.22 cm2 and DVI 0.31. The aortic valve is bicuspid. There is moderate calcification of the aortic valve. There is moderate thickening of the aortic valve. There is moderate aortic valve annular calcification. Aortic valve regurgitation is not visualized. Moderate to severe aortic stenosis is present. Aortic valve mean gradient measures 28.5 mmHg. Aortic valve peak gradient measures 49.4 mmHg. Aortic valve area, by VTI measures 1.28 cm. Pulmonic Valve: The pulmonic valve was normal in structure. Pulmonic valve regurgitation is not visualized. No evidence of pulmonic stenosis. Aorta: May 2021 previous aortic root measured 3.9 only 3.5 on current study No evidence of aortic dissection, Sinus of valsalva aneurysm or other root pathology No AR and no pericardial effusion. The aortic root is normal in size and structure. Venous: The inferior vena cava is normal in size with greater than 50% respiratory variability, suggesting right atrial pressure of 3 mmHg. IAS/Shunts: No atrial level shunt detected by color flow Doppler.  LEFT VENTRICLE PLAX 2D LVIDd:          4.70 cm  Diastology LVIDs:         2.30 cm  LV e' medial:    5.77 cm/s LV PW:         1.20 cm  LV E/e' medial:  11.7 LV IVS:        1.10 cm  LV e' lateral:   7.62 cm/s LVOT diam:     2.30 cm  LV E/e' lateral: 8.8 LV SV:         85 LV SV Index:   43 LVOT Area:     4.15 cm  RIGHT VENTRICLE RV Basal diam:  3.50 cm RV S prime:     10.80 cm/s TAPSE (M-mode): 2.2 cm LEFT ATRIUM             Index       RIGHT ATRIUM           Index LA diam:        3.80 cm 1.94 cm/m  RA Area:     15.40 cm LA Vol (A2C):   27.4 ml 13.97 ml/m RA Volume:   37.20 ml  18.96 ml/m LA Vol (A4C):   27.5 ml 14.02 ml/m LA Biplane Vol: 28.2 ml 14.38 ml/m  AORTIC VALVE AV Area (Vmax):    1.22 cm AV Area (Vmean):   1.17 cm AV Area (VTI):     1.28 cm AV Vmax:           351.33 cm/s AV Vmean:          244.000 cm/s AV VTI:            0.667 m AV Peak Grad:      49.4 mmHg AV Mean Grad:      28.5 mmHg LVOT Vmax:         103.00 cm/s LVOT Vmean:        68.700 cm/s LVOT VTI:          0.205 m LVOT/AV VTI ratio: 0.31  AORTA Ao Root diam: 2.90 cm MITRAL VALVE               TRICUSPID VALVE MV Area (PHT): 3.03 cm    TR Peak grad:   21.5 mmHg MV Decel Time: 250 msec    TR Vmax:        232.00 cm/s MV E velocity: 67.30 cm/s MV A velocity: 91.70 cm/s  SHUNTS MV E/A ratio:  0.73        Systemic VTI:  0.20 m                            Systemic Diam: 2.30 cm Jenkins Rouge MD Electronically signed by Jenkins Rouge MD Signature Date/Time: 11/06/2020/3:53:07 PM    Final    CT Angio Chest/Abd/Pel for Dissection W and/or W/WO  Result Date: 11/06/2020 CLINICAL DATA:  60 year old presenting with chest pain radiating into back. Current history of aortic stenosis due to congenital bicuspid valve. EXAM: CT ANGIOGRAPHY CHEST, ABDOMEN AND PELVIS (DISSECTION PROTOCOL) TECHNIQUE: Non-contrast CT of the chest was initially obtained. Multidetector CT imaging through the chest, abdomen and pelvis was performed using the standard protocol during bolus administration of intravenous  contrast. Multiplanar reconstructed images and MIPs were obtained and reviewed to evaluate the vascular anatomy. CONTRAST:  152mL OMNIPAQUE IOHEXOL 350 MG/ML IV. COMPARISON:  CTA chest for pulmonary embolism earlier same day. FINDINGS: CTA CHEST FINDINGS Cardiovascular: No visible calcified plaque on the unenhanced images. Contrast opacification of the systemic arteries is excellent. No evidence of thoracic aortic dissection. 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 Kyla Balzarine note from earlier today). No visible soft plaque within the thoracic aorta. Heart size upper normal to slightly enlarged with evidence of LEFT ventricular hypertrophy. Marked thickening of the aortic valve with associated dense calcification. While not done for coronary artery evaluation, there is calcified and noncalcified plaque in the LAD. No visible coronary atherosclerosis elsewhere. Mediastinum/Nodes: No pathologically enlarged mediastinal, hilar or axillary lymph nodes. No mediastinal masses.  Normal-appearing esophagus. Visualized thyroid gland normal in appearance. Lungs/Pleura: As mentioned on the CT a earlier today, there is a 4 mm perifissural nodule adjacent to the RIGHT major fissure in the RIGHT MIDDLE LOBE. Lung parenchyma otherwise clear apart from dependent atelectasis posteriorly in the lower lobes. Central airways patent without significant bronchial wall thickening. No pleural effusions. Musculoskeletal: Regional skeleton unremarkable without acute or significant osseous abnormality. Review of the MIP images confirms the above findings. CTA ABDOMEN AND PELVIS FINDINGS VASCULAR Aorta: Mild atherosclerosis with calcified and noncalcified plaque distally. No evidence of aneurysm or dissection. 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 SMA: Widely patent without  atherosclerosis. Renals: Single renal arteries bilaterally, widely patent without atherosclerosis. IMA: Widely patent without atherosclerosis. Inflow: Widely patent iliofemoral arteries bilaterally. Mild atherosclerosis involving the distal LEFT common iliac artery. No visible atherosclerosis elsewhere. Veins: Not evaluated. Review of the MIP images confirms the above findings. NON-VASCULAR Hepatobiliary: Liver normal in size and appearance. Surgically absent gallbladder. No unexpected biliary ductal dilation. Pancreas: Normal in appearance without evidence of mass, ductal dilation, or inflammation. Spleen: Normal in size and appearance. Adrenals/Urinary Tract: Normal appearing adrenal glands. Calculi in both kidneys, including 2 calculi in adjacent upper pole calices of the LEFT kidney on the order 2-3 mm, a small 1 2 mm calculus in a mid calyx of the LEFT kidney, a 5 mm calculus in a pole calyx of the LEFT kidney and a small 1-2 mm calculus in a LOWER pole calyx of the RIGHT kidney. No ureteral calculi on either side. No evidence of hydronephrosis. No focal parenchymal abnormality involving either kidney. Normal appearing urinary bladder. Stomach/Bowel: Stomach normal in appearance for the degree of distention. Mild dilation of a few loops of jejunum in the upper abdomen, with associated mild wall thickening, though there is no edema/inflammation in the surrounding mesenteric fat. Remaining small bowel normal in appearance. Moderate stool burden throughout the normal appearing colon. Normal appendix in the RIGHT upper pelvis. Lymphatic: No pathologic lymphadenopathy. Reproductive: Surgically absent uterus.  No adnexal masses. Other: None. Musculoskeletal: Regional skeleton unremarkable without acute or significant osseous abnormality. Review of the MIP images confirms the above findings. IMPRESSION: 1. No evidence of thoracic or abdominal aortic dissection. 2. Ascending thoracic aortic aneurysm measuring  approximately 3.9 cm in diameter (corresponding with the size identified at echocardiography per Dr. Kyla Balzarine note from earlier today). 3. Solitary calcified and noncalcified plaque in the LAD. 4. 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 5. Bilateral non-obstructing renal calculi. 6. Mild dilation of a few loops of jejunum involving the upper abdomen, with associated mild wall thickening, though there is no edema/inflammation in the surrounding mesenteric fat. This may be due to a localized ileus or enteritis. 7. Aortic Atherosclerosis (ICD10-I70.0), mild. I telephoned these results at the time of interpretation to Dr. Johnsie Cancel of cardiology 11/06/2020 at 6:54 p.m. Electronically Signed   By: Evangeline Dakin M.D.   On: 11/06/2020 19:10    Cardiac Studies:  ECG: SR LAD no acute changes    Telemetry:  NSR 11/07/2020   Echo:  Normal EF 60-65% bicuspid AV with mean gradient 28.5 mmHg peak 49.4 mmHg DVI 0.31 AVA 1.22 cm2   No significant change from May 2021   Medications:   . [START ON 11/08/2020] aspirin  81 mg Oral Pre-Cath  . aspirin EC  81 mg Oral Daily  . atorvastatin  80 mg  Oral Daily  . escitalopram  5 mg Oral Daily  . estradiol  2 mg Oral Daily  . metoprolol tartrate  25 mg Oral BID  . sodium chloride flush  3 mL Intravenous Q12H     . sodium chloride 10 mL/hr at 11/07/20 0637  . sodium chloride    . [START ON 11/08/2020] sodium chloride     Followed by  . [START ON 11/08/2020] sodium chloride    . heparin 1,250 Units/hr (11/07/20 0743)    Assessment/Plan:   1. Chest Pain In setting of GI illness and bicuspid AV. She has been chest pain free since admission but despite this Troponin has risen 359->424->536 with no acute ECG changes. I think differential more likely viral myocarditis or coronary dissection. She had some calcific Plaque in her LAD on CTA for dissection protocol but typical large vessel epicardial  MI seems unlikely so will stop heparin She will have Right and left cath tomorrow. Can consider cardiac MRI if cath does not show CAD/coronary dissection Continue ASA/beta blocker and statin   2. AS:  Bicuspid AV stable by TTE done yesterday moderate   3. Aorta:  Radiologist mis-read non gated PE protocol and suggested acute aortic pathology Root only 3.9 cm and f/u echo and appropriate Gated dissection protocol CTA showed no acute aortic pathology and no coarctation   Jenkins Rouge 11/07/2020, 9:33 AM

## 2020-11-07 NOTE — Progress Notes (Signed)
[facility] for Heparin Indication: chest pain/ACS  Allergies  Allergen Reactions  . Sulfa Antibiotics Rash    Patient Measurements: Height: 5' 9.5" (176.5 cm) Weight: 80.8 kg (178 lb 3.2 oz) IBW/kg (Calculated) : 67.35 Heparin Dosing Weight: 79 kg  Vital Signs: Temp: 98.1 F (36.7 C) (04/10 0440) Temp Source: Oral (04/10 0440) BP: 114/81 (04/10 0440) Pulse Rate: 63 (04/10 0440)  Labs: Recent Labs    11/06/20 1145 11/06/20 1251 11/06/20 1557 11/06/20 1614 11/06/20 2154 11/07/20 0118  HGB 15.6*  --   --   --   --  14.0  HCT 46.9*  --   --   --   --  41.6  PLT 216  --   --   --   --  196  HEPARINUNFRC  --   --   --  0.77* 0.43 0.26*  CREATININE 0.60  --   --   --   --  0.59  TROPONINIHS 359* 424* 536*  --   --   --     Estimated Creatinine Clearance: 82.6 mL/min (by C-G formula based on SCr of 0.59 mg/dL).   Medical History: Past Medical History:  Diagnosis Date  . Calcific aortic stenosis of bicuspid valve    2-D echo 02/16/2017: Difficult to see if bicuspid (congenital versus functional) or tricuspid.  thickened, calcified Peak and mean gradients - 45 and 25 mm Hg respectively c/w MODERATE  AS.     Assessment: 60 y.o. F presents with CP - troponin up to 359. To begin heparin per pharmacy for ACS. No AC PTA. CBC stable.  Heparin level now subtherapeutic on 1100 units/hr. CBC wnl. No bleeding or issues with infusion per RN.   Goal of Therapy:  Heparin level 0.3-0.7 units/ml Monitor platelets by anticoagulation protocol: Yes   Plan:  Increase IV heparin to 1250 units/h 6h heparin level Monitor daily heparin level, CBC, s/s bleeding F/u plan for [facility] Monday   Rebbeca Paul, PharmD PGY1 Pharmacy Resident 11/07/2020 7:13 AM  Please check [email or web address] for unit-specific pharmacy phone numbers.

## 2020-11-08 ENCOUNTER — Inpatient Hospital Stay (HOSPITAL_COMMUNITY)
Admission: EM | Disposition: A | Discharge status: Home / Self Care | Payer: Self-pay | Source: Home / Self Care | Attending: Cardiovascular Disease

## 2020-11-08 ENCOUNTER — Encounter (HOSPITAL_COMMUNITY): Payer: Self-pay | Admitting: Cardiology

## 2020-11-08 ENCOUNTER — Inpatient Hospital Stay (HOSPITAL_COMMUNITY): Payer: BC Managed Care – PPO

## 2020-11-08 DIAGNOSIS — Q231 Congenital insufficiency of aortic valve: Secondary | ICD-10-CM | POA: Diagnosis not present

## 2020-11-08 DIAGNOSIS — I350 Nonrheumatic aortic (valve) stenosis: Secondary | ICD-10-CM | POA: Diagnosis not present

## 2020-11-08 DIAGNOSIS — R778 Other specified abnormalities of plasma proteins: Secondary | ICD-10-CM | POA: Diagnosis present

## 2020-11-08 HISTORY — PX: RIGHT/LEFT HEART CATH AND CORONARY ANGIOGRAPHY: CATH118266

## 2020-11-08 LAB — ECHOCARDIOGRAM COMPLETE
AR max vel: 0.96 cm2
AV Area VTI: 0.9 cm2
AV Area mean vel: 0.96 cm2
AV Mean grad: 44 mmHg
AV Peak grad: 70.9 mmHg
Ao pk vel: 4.21 m/s
Area-P 1/2: 3.08 cm2
Calc EF: 60.7 %
Height: 69.5 in
MV M vel: 3.58 m/s
MV Peak grad: 51.3 mmHg
MV VTI: 2.69 cm2
Radius: 0.2 cm
S' Lateral: 2.7 cm
Single Plane A2C EF: 62.7 %
Single Plane A4C EF: 58.4 %
Weight: 2851.2 oz

## 2020-11-08 LAB — BASIC METABOLIC PANEL
Anion gap: 4 — ABNORMAL LOW (ref 5–15)
BUN: 15 mg/dL (ref 6–20)
CO2: 30 mmol/L (ref 22–32)
Calcium: 9 mg/dL (ref 8.9–10.3)
Chloride: 104 mmol/L (ref 98–111)
Creatinine, Ser: 0.71 mg/dL (ref 0.44–1.00)
GFR, Estimated: >60 mL/min (ref >60)
Glucose, Bld: 90 mg/dL (ref 70–99)
Potassium: 4.6 mmol/L (ref 3.5–5.1)
Sodium: 138 mmol/L (ref 135–145)

## 2020-11-08 LAB — CBC
HCT: 43 % (ref 36.0–46.0)
Hemoglobin: 14.5 g/dL (ref 12.0–15.0)
MCH: 31.3 pg (ref 26.0–34.0)
MCHC: 33.7 g/dL (ref 30.0–36.0)
MCV: 92.7 fL (ref 80.0–100.0)
Platelets: 213 10*3/uL (ref 150–400)
RBC: 4.64 MIL/uL (ref 3.87–5.11)
RDW: 11.9 % (ref 11.5–15.5)
WBC: 5.9 10*3/uL (ref 4.0–10.5)
nRBC: 0 % (ref 0.0–0.2)

## 2020-11-08 LAB — POCT I-STAT 7, (LYTES, BLD GAS, ICA,H+H)
Acid-Base Excess: 1 mmol/L (ref 0.0–2.0)
Bicarbonate: 27.3 mmol/L (ref 20.0–28.0)
Calcium, Ion: 1.22 mmol/L (ref 1.15–1.40)
HCT: 38 % (ref 36.0–46.0)
Hemoglobin: 12.9 g/dL (ref 12.0–15.0)
O2 Saturation: 98 %
Potassium: 3.8 mmol/L (ref 3.5–5.1)
Sodium: 141 mmol/L (ref 135–145)
TCO2: 29 mmol/L (ref 22–32)
pCO2 arterial: 47.3 mmHg (ref 32.0–48.0)
pH, Arterial: 7.37 (ref 7.350–7.450)
pO2, Arterial: 108 mmHg (ref 83.0–108.0)

## 2020-11-08 LAB — POCT I-STAT EG7
Acid-Base Excess: 2 mmol/L (ref 0.0–2.0)
Acid-Base Excess: 2 mmol/L (ref 0.0–2.0)
Bicarbonate: 28.2 mmol/L — ABNORMAL HIGH (ref 20.0–28.0)
Bicarbonate: 28.3 mmol/L — ABNORMAL HIGH (ref 20.0–28.0)
Calcium, Ion: 1.22 mmol/L (ref 1.15–1.40)
Calcium, Ion: 1.25 mmol/L (ref 1.15–1.40)
HCT: 38 % (ref 36.0–46.0)
HCT: 38 % (ref 36.0–46.0)
Hemoglobin: 12.9 g/dL (ref 12.0–15.0)
Hemoglobin: 12.9 g/dL (ref 12.0–15.0)
O2 Saturation: 75 %
O2 Saturation: 78 %
Potassium: 3.9 mmol/L (ref 3.5–5.1)
Potassium: 3.9 mmol/L (ref 3.5–5.1)
Sodium: 140 mmol/L (ref 135–145)
Sodium: 140 mmol/L (ref 135–145)
TCO2: 30 mmol/L (ref 22–32)
TCO2: 30 mmol/L (ref 22–32)
pCO2, Ven: 51 mmHg (ref 44.0–60.0)
pCO2, Ven: 51.1 mmHg (ref 44.0–60.0)
pH, Ven: 7.35 (ref 7.250–7.430)
pH, Ven: 7.352 (ref 7.250–7.430)
pO2, Ven: 43 mmHg (ref 32.0–45.0)
pO2, Ven: 45 mmHg (ref 32.0–45.0)

## 2020-11-08 LAB — PROTIME-INR
INR: 1 (ref 0.8–1.2)
Prothrombin Time: 12.7 seconds (ref 11.4–15.2)

## 2020-11-08 SURGERY — RIGHT/LEFT HEART CATH AND CORONARY ANGIOGRAPHY
Anesthesia: LOCAL

## 2020-11-08 MED ORDER — SODIUM CHLORIDE 0.9 % IV SOLN: 250.0000 mL | INTRAVENOUS | Status: DC | PRN

## 2020-11-08 MED ORDER — IOHEXOL 350 MG/ML SOLN
INTRAVENOUS | Status: DC | PRN
  Administered 2020-11-08: 40 mL

## 2020-11-08 MED ORDER — LIDOCAINE HCL (PF) 1 % IJ SOLN
INTRAMUSCULAR | Status: AC
  Filled 2020-11-08: qty 30

## 2020-11-08 MED ORDER — MIDAZOLAM HCL 2 MG/2ML IJ SOLN
INTRAMUSCULAR | Status: AC
  Filled 2020-11-08: qty 2

## 2020-11-08 MED ORDER — HEPARIN (PORCINE) IN NACL 1000-0.9 UT/500ML-% IV SOLN
INTRAVENOUS | Status: AC
  Filled 2020-11-08: qty 500

## 2020-11-08 MED ORDER — FENTANYL CITRATE (PF) 100 MCG/2ML IJ SOLN
INTRAMUSCULAR | Status: DC | PRN
  Administered 2020-11-08 (×2): 25 ug via INTRAVENOUS

## 2020-11-08 MED ORDER — FLUTICASONE PROPIONATE 50 MCG/ACT NA SUSP
2.0000 | Freq: Every day | NASAL | Status: DC
  Administered 2020-11-08 – 2020-11-09 (×2): 2 via NASAL
  Filled 2020-11-08: qty 16

## 2020-11-08 MED ORDER — LIDOCAINE HCL (PF) 1 % IJ SOLN
INTRAMUSCULAR | Status: DC | PRN
  Administered 2020-11-08 (×2): 2 mL

## 2020-11-08 MED ORDER — SALINE SPRAY 0.65 % NA SOLN
1.0000 | NASAL | Status: DC | PRN
  Administered 2020-11-08 (×2): 1 via NASAL
  Filled 2020-11-08: qty 44

## 2020-11-08 MED ORDER — MIDAZOLAM HCL 2 MG/2ML IJ SOLN
INTRAMUSCULAR | Status: DC | PRN
  Administered 2020-11-08: 1 mg via INTRAVENOUS
  Administered 2020-11-08: 2 mg via INTRAVENOUS

## 2020-11-08 MED ORDER — SODIUM CHLORIDE 0.9% FLUSH: 3.0000 mL | INTRAVENOUS | Status: DC | PRN

## 2020-11-08 MED ORDER — SODIUM CHLORIDE 0.9% FLUSH
3.0000 mL | Freq: Two times a day (BID) | INTRAVENOUS | Status: DC
  Administered 2020-11-09 (×2): 3 mL via INTRAVENOUS

## 2020-11-08 MED ORDER — VERAPAMIL HCL 2.5 MG/ML IV SOLN
INTRAVENOUS | Status: DC | PRN
  Administered 2020-11-08: 10 mL via INTRA_ARTERIAL

## 2020-11-08 MED ORDER — FENTANYL CITRATE (PF) 100 MCG/2ML IJ SOLN
INTRAMUSCULAR | Status: AC
  Filled 2020-11-08: qty 2

## 2020-11-08 MED ORDER — VERAPAMIL HCL 2.5 MG/ML IV SOLN
INTRAVENOUS | Status: AC
  Filled 2020-11-08: qty 2

## 2020-11-08 MED ORDER — HEPARIN SODIUM (PORCINE) 1000 UNIT/ML IJ SOLN
INTRAMUSCULAR | Status: AC
  Filled 2020-11-08: qty 1

## 2020-11-08 MED ORDER — HEPARIN (PORCINE) IN NACL 1000-0.9 UT/500ML-% IV SOLN
INTRAVENOUS | Status: AC
  Filled 2020-11-08: qty 1000

## 2020-11-08 MED ORDER — SODIUM CHLORIDE 0.9 % WEIGHT BASED INFUSION: 1.0000 mL/kg/h | INTRAVENOUS | Status: AC

## 2020-11-08 MED ORDER — HEPARIN SODIUM (PORCINE) 1000 UNIT/ML IJ SOLN
INTRAMUSCULAR | Status: DC | PRN
  Administered 2020-11-08: 4000 [IU] via INTRAVENOUS

## 2020-11-08 MED ORDER — HEPARIN (PORCINE) IN NACL 1000-0.9 UT/500ML-% IV SOLN
INTRAVENOUS | Status: DC | PRN
  Administered 2020-11-08 (×2): 500 mL

## 2020-11-08 SURGICAL SUPPLY — 13 items
CATH 5FR JL3.5 JR4 ANG PIG MP (CATHETERS) ×2 IMPLANT
CATH INFINITI 5FR AL1 (CATHETERS) ×2 IMPLANT
CATH SWAN GANZ 7F STRAIGHT (CATHETERS) ×2 IMPLANT
DEVICE RAD COMP TR BAND LRG (VASCULAR PRODUCTS) ×2 IMPLANT
GLIDESHEATH SLEND SS 6F .021 (SHEATH) ×2 IMPLANT
GLIDESHEATH SLENDER 7FR .021G (SHEATH) ×2 IMPLANT
GUIDEWIRE INQWIRE 1.5J.035X260 (WIRE) ×1 IMPLANT
INQWIRE 1.5J .035X260CM (WIRE) ×2
KIT HEART LEFT (KITS) ×2 IMPLANT
PACK CARDIAC CATHETERIZATION (CUSTOM PROCEDURE TRAY) ×2 IMPLANT
SHEATH PROBE COVER 6X72 (BAG) ×2 IMPLANT
TRANSDUCER W/STOPCOCK (MISCELLANEOUS) ×2 IMPLANT
TUBING CIL FLEX 10 FLL-RA (TUBING) ×2 IMPLANT

## 2020-11-08 NOTE — Interval H&P Note (Signed)
History and Physical Interval Note:  11/08/2020 12:53 PM  Olivia Mcgrath  has presented today for surgery, with the diagnosis of NSTEMI.  The various methods of treatment have been discussed with the patient and family. After consideration of risks, benefits and other options for treatment, the patient has consented to  Procedure(s): RIGHT/LEFT HEART CATH AND CORONARY ANGIOGRAPHY (N/A) as a surgical intervention.  The patient's history has been reviewed, patient examined, no change in status, stable for surgery.  I have reviewed the patient's chart and labs.  Questions were answered to the patient's satisfaction.   Cath Lab Visit (complete for each Cath Lab visit)  Clinical Evaluation Leading to the Procedure:   ACS: Yes.    Non-ACS:    Anginal Classification: CCS III  Anti-ischemic medical therapy: Minimal Therapy (1 class of medications)  Non-Invasive Test Results: No non-invasive testing performed  Prior CABG: No previous CABG        Olivia Mcgrath 11/08/2020 12:53 PM

## 2020-11-08 NOTE — Progress Notes (Addendum)
Subjective:   No CP or SOB overnight. Husband related to Olivia Mcgrath in the cath lab, they want him to know she is here.  Objective:  Vitals:   11/06/20 2129 11/07/20 0014 11/07/20 0440 11/07/20 2215  BP: 113/84 106/72 114/81 122/73  Pulse: 69 62 63 64  Resp:  16 18   Temp:  98.2 F (36.8 C) 98.1 F (36.7 C)   TempSrc:  Oral Oral   SpO2:  95% 97%   Weight:   80.8 kg   Height:        Intake/Output from previous day: No intake or output data in the 24 hours ending 11/08/20 0702  Physical Exam: General: Well developed, well nourished, female in no acute distress Head: Eyes PERRLA, Head normocephalic and atraumatic Lungs: clear bilaterally to auscultation. Heart: HRRR S1 S2, without rub or gallop. 3.6 harsh murmur. 4/4 extremity pulses are 2+ & equal. No JVD. Abdomen: Bowel sounds are present, abdomen soft and non-tender without masses or  hernias noted. Msk: Normal strength and tone for age. Extremities: No clubbing, cyanosis or edema.    Skin:  No rashes or lesions noted. Neuro: Alert and oriented X 3. Psych:  Good affect, responds appropriately   Lab Results: Basic Metabolic Panel: Recent Labs    11/06/20 1145 11/07/20 0118  NA 140 141  K 3.9 3.3*  CL 104 106  CO2 28 27  GLUCOSE 110* 120*  BUN 10 10  CREATININE 0.60 0.59  CALCIUM 8.7* 8.2*   Liver Function Tests: Recent Labs    11/06/20 1145  AST 26  ALT 20  ALKPHOS 58  BILITOT 0.5  PROT 6.5  ALBUMIN 3.8   Recent Labs    11/06/20 1145  LIPASE 32   CBC: Recent Labs    11/07/20 0118 11/08/20 0530  WBC 5.1 5.9  HGB 14.0 14.5  HCT 41.6 43.0  MCV 92.7 92.7  PLT 196 213   Cardiac Enzymes: No results for input(s): CKTOTAL, CKMB, CKMBINDEX, TROPONINI in the last 72 hours. BNP: Invalid input(s): POCBNP D-Dimer: No results for input(s): DDIMER in the last 72 hours. Hemoglobin A1C: Recent Labs    11/07/20 0118  HGBA1C 5.2   Fasting Lipid Panel: Recent Labs    11/06/20 1557  CHOL 223*   HDL 58  LDLCALC 146*  TRIG 93  CHOLHDL 3.8   Thyroid Function Tests: Recent Labs    11/06/20 1557  TSH 1.561   Anemia Panel: No results for input(s): VITAMINB12, FOLATE, FERRITIN, TIBC, IRON, RETICCTPCT in the last 72 hours.  Imaging: DG Chest 2 View  Result Date: 11/06/2020 CLINICAL DATA:  Chest pain and shortness of breath. EXAM: CHEST - 2 VIEW COMPARISON:  Chest x-ray dated March 13, 2020. FINDINGS: The heart size and mediastinal contours are within normal limits. Both lungs are clear. The visualized skeletal structures are unremarkable. IMPRESSION: No active cardiopulmonary disease. Electronically Signed   By: Olivia Mcgrath M.D.   On: 11/06/2020 12:29   CT Angio Chest PE W and/or Wo Contrast  Result Date: 11/06/2020 CLINICAL DATA:  Central chest pain radiating to the back. Positive D-dimer. EXAM: CT ANGIOGRAPHY CHEST WITH CONTRAST TECHNIQUE: Multidetector CT imaging of the chest was performed using the standard protocol during bolus administration of intravenous contrast. Multiplanar CT image reconstructions and MIPs were obtained to evaluate the vascular anatomy. CONTRAST:  87mL OMNIPAQUE IOHEXOL 350 MG/ML SOLN COMPARISON:  None. FINDINGS: Cardiovascular: Satisfactory opacification of the pulmonary arteries to the segmental level. No evidence of pulmonary  embolism. Normal heart size. No pericardial effusion. There is a fusiform aneurysmal dilation of the ascending aorta, from the root of the aorta to the aortic arch. The ascending aorta measures 4.1 cm. The aortic arch measures 3.5 cm. The descending aorta reverts to normal caliber measuring 2.8 cm. There is bulbous appearance of the aortic valve annulus, which may be involved, not well visualized. Mediastinum/Nodes: No enlarged mediastinal, hilar, or axillary lymph nodes. Thyroid gland, trachea, and esophagus demonstrate no significant findings. Lungs/Pleura: Wispy symmetric opacities in the dependent portions of the lungs are  suggestive of mild interstitial pulmonary edema. 4 mm perifissural right middle lobe soft tissue nodule. 2 mm subpleural right middle lobe soft tissue nodule is stable from 2013, benign. Upper Abdomen: No acute abnormality. Musculoskeletal: No chest wall abnormality. No acute or significant osseous findings. Review of the MIP images confirms the above findings. IMPRESSION: 1. No evidence of pulmonary embolus. 2. 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 The ascending aorta measures 4.1 cm. 3. Bulbous appearance of the aortic valve annulus, which may be involved, not well visualized. Further evaluation with cardiac echo may be considered. 4. Wispy symmetric opacities in the dependent portions of the lungs are suggestive of mild interstitial pulmonary edema. 5. Cardiology and cardiothoracic surgery consultation is recommended. 6. 4 mm peri fissural right middle lobe soft tissue nodule. No follow-up needed if patient is low-risk. Non-contrast chest CT can be considered in 12 months if patient is high-risk. This recommendation follows the consensus statement: Guidelines for Management of Incidental Pulmonary Nodules Detected on CT Images: From the Fleischner Society 2017; Radiology 2017; 284:228-243. 7. These results were called by telephone at the time of interpretation on 11/06/2020 at 2:21 pm to Dr. Pattricia Mcgrath , who verbally acknowledged these results. Electronically Signed   By: Fidela Salisbury M.D.   On: 11/06/2020 14:23   ECHOCARDIOGRAM COMPLETE  Result Date: 11/06/2020    ECHOCARDIOGRAM REPORT   Patient Name:   Olivia Mcgrath Date of Exam: 11/06/2020 Medical Rec #:  [id number]    Height:       69.5 in Accession #:    [id number]   Weight:       175.0 lb Date of Birth:  10-08-1960    BSA:          1.962 m Patient Age:    60 years     BP:           120/80 mmHg Patient Gender: F            HR:           77 bpm. Exam Location:  Inpatient Procedure: 2D Echo  Indications:    R07.9* Chest pain, unspecified  History:        Patient has prior history of Echocardiogram examinations. Aortic                 Valve Disease.  Sonographer:    Olivia Mcgrath Referring Phys: Olivia Mcgrath  1. Left ventricular ejection fraction, by estimation, is 60 to 65%. The left ventricle has normal function. The left ventricle has no regional wall motion abnormalities. The left ventricular internal cavity size was mildly dilated. Left ventricular diastolic parameters are consistent with Grade I diastolic dysfunction (impaired relaxation).  2. Right ventricular systolic function is normal. The right ventricular size is normal.  3. Left atrial size was mildly dilated.  4. The mitral valve is normal  in structure. Trivial mitral valve regurgitation. No evidence of mitral stenosis.  5. Not well visualized but appears / known to be bicusid mean gradient stable 28.5 mmHg was 30 mmHg May 2021 However AVA/DVI more consistent now with moderate and not severe AS AVA 1.22 cm2 and DVI 0.31 . The aortic valve is bicuspid. There is moderate calcification of the aortic valve. There is moderate thickening of the aortic valve. Aortic valve regurgitation is not visualized. Moderate to severe aortic valve stenosis.  6. May 2021 previous aortic root measured 3.9 only 3.5 on current study No evidence of aortic dissection, Sinus of valsalva aneurysm or other root pathology No AR and no pericardial effusion.  7. The inferior vena cava is normal in size with greater than 50% respiratory variability, suggesting right atrial pressure of 3 mmHg. FINDINGS  Left Ventricle: Left ventricular ejection fraction, by estimation, is 60 to 65%. The left ventricle has normal function. The left ventricle has no regional wall motion abnormalities. The left ventricular internal cavity size was mildly dilated. There is  no left ventricular hypertrophy. Left ventricular diastolic parameters are consistent with Grade I  diastolic dysfunction (impaired relaxation). Right Ventricle: The right ventricular size is normal. No increase in right ventricular wall thickness. Right ventricular systolic function is normal. Left Atrium: Left atrial size was mildly dilated. Right Atrium: Right atrial size was normal in size. Pericardium: There is no evidence of pericardial effusion. Mitral Valve: The mitral valve is normal in structure. There is mild thickening of the mitral valve leaflet(s). Trivial mitral valve regurgitation. No evidence of mitral valve stenosis. Tricuspid Valve: The tricuspid valve is normal in structure. Tricuspid valve regurgitation is not demonstrated. No evidence of tricuspid stenosis. Aortic Valve: Not well visualized but appears / known to be bicusid mean gradient stable 28.5 mmHg was 30 mmHg May 2021 However AVA/DVI more consistent now with moderate and not severe AS AVA 1.22 cm2 and DVI 0.31. The aortic valve is bicuspid. There is moderate calcification of the aortic valve. There is moderate thickening of the aortic valve. There is moderate aortic valve annular calcification. Aortic valve regurgitation is not visualized. Moderate to severe aortic stenosis is present. Aortic valve mean gradient measures 28.5 mmHg. Aortic valve peak gradient measures 49.4 mmHg. Aortic valve area, by VTI measures 1.28 cm. Pulmonic Valve: The pulmonic valve was normal in structure. Pulmonic valve regurgitation is not visualized. No evidence of pulmonic stenosis. Aorta: May 2021 previous aortic root measured 3.9 only 3.5 on current study No evidence of aortic dissection, Sinus of valsalva aneurysm or other root pathology No AR and no pericardial effusion. The aortic root is normal in size and structure. Venous: The inferior vena cava is normal in size with greater than 50% respiratory variability, suggesting right atrial pressure of 3 mmHg. IAS/Shunts: No atrial level shunt detected by color flow Doppler.  LEFT VENTRICLE PLAX 2D LVIDd:          4.70 cm  Diastology LVIDs:         2.30 cm  LV e' medial:    5.77 cm/s LV PW:         1.20 cm  LV E/e' medial:  11.7 LV IVS:        1.10 cm  LV e' lateral:   7.62 cm/s LVOT diam:     2.30 cm  LV E/e' lateral: 8.8 LV SV:         85 LV SV Index:   43 LVOT Area:  4.15 cm  RIGHT VENTRICLE RV Basal diam:  3.50 cm RV S prime:     10.80 cm/s TAPSE (M-mode): 2.2 cm LEFT ATRIUM             Index       RIGHT ATRIUM           Index LA diam:        3.80 cm 1.94 cm/m  RA Area:     15.40 cm LA Vol (A2C):   27.4 ml 13.97 ml/m RA Volume:   37.20 ml  18.96 ml/m LA Vol (A4C):   27.5 ml 14.02 ml/m LA Biplane Vol: 28.2 ml 14.38 ml/m  AORTIC VALVE AV Area (Vmax):    1.22 cm AV Area (Vmean):   1.17 cm AV Area (VTI):     1.28 cm AV Vmax:           351.33 cm/s AV Vmean:          244.000 cm/s AV VTI:            0.667 m AV Peak Grad:      49.4 mmHg AV Mean Grad:      28.5 mmHg LVOT Vmax:         103.00 cm/s LVOT Vmean:        68.700 cm/s LVOT VTI:          0.205 m LVOT/AV VTI ratio: 0.31  AORTA Ao Root diam: 2.90 cm MITRAL VALVE               TRICUSPID VALVE MV Area (PHT): 3.03 cm    TR Peak grad:   21.5 mmHg MV Decel Time: 250 msec    TR Vmax:        232.00 cm/s MV E velocity: 67.30 cm/s MV A velocity: 91.70 cm/s  SHUNTS MV E/A ratio:  0.73        Systemic VTI:  0.20 m                            Systemic Diam: 2.30 cm Jenkins Rouge MD Electronically signed by Jenkins Rouge MD Signature Date/Time: 11/06/2020/3:53:07 PM    Final    CT Angio Chest/Abd/Pel for Dissection W and/or W/WO  Result Date: 11/06/2020 CLINICAL DATA:  60 year old presenting with chest pain radiating into back. Current history of aortic stenosis due to congenital bicuspid valve. EXAM: CT ANGIOGRAPHY CHEST, ABDOMEN AND PELVIS (DISSECTION PROTOCOL) TECHNIQUE: Non-contrast CT of the chest was initially obtained. Multidetector CT imaging through the chest, abdomen and pelvis was performed using the standard protocol during bolus administration of  intravenous contrast. Multiplanar reconstructed images and MIPs were obtained and reviewed to evaluate the vascular anatomy. CONTRAST:  153mL OMNIPAQUE IOHEXOL 350 MG/ML IV. COMPARISON:  CTA chest for pulmonary embolism earlier same day. FINDINGS: CTA CHEST FINDINGS Cardiovascular: No visible calcified plaque on the unenhanced images. Contrast opacification of the systemic arteries is excellent. No evidence of thoracic aortic dissection. 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 Kyla Balzarine note from earlier today). No visible soft plaque within the thoracic aorta. Heart size upper normal to slightly enlarged with evidence of LEFT ventricular hypertrophy. Marked thickening of the aortic valve with associated dense calcification. While not done for coronary artery evaluation, there is calcified and noncalcified plaque in the LAD. No visible coronary atherosclerosis elsewhere. Mediastinum/Nodes: No pathologically enlarged mediastinal, hilar or axillary lymph nodes.  No mediastinal masses. Normal-appearing esophagus. Visualized thyroid gland normal in appearance. Lungs/Pleura: As mentioned on the CT a earlier today, there is a 4 mm perifissural nodule adjacent to the RIGHT major fissure in the RIGHT MIDDLE LOBE. Lung parenchyma otherwise clear apart from dependent atelectasis posteriorly in the lower lobes. Central airways patent without significant bronchial wall thickening. No pleural effusions. Musculoskeletal: Regional skeleton unremarkable without acute or significant osseous abnormality. Review of the MIP images confirms the above findings. CTA ABDOMEN AND PELVIS FINDINGS VASCULAR Aorta: Mild atherosclerosis with calcified and noncalcified plaque distally. No evidence of aneurysm or dissection. 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 SMA: Widely patent without  atherosclerosis. Renals: Single renal arteries bilaterally, widely patent without atherosclerosis. IMA: Widely patent without atherosclerosis. Inflow: Widely patent iliofemoral arteries bilaterally. Mild atherosclerosis involving the distal LEFT common iliac artery. No visible atherosclerosis elsewhere. Veins: Not evaluated. Review of the MIP images confirms the above findings. NON-VASCULAR Hepatobiliary: Liver normal in size and appearance. Surgically absent gallbladder. No unexpected biliary ductal dilation. Pancreas: Normal in appearance without evidence of mass, ductal dilation, or inflammation. Spleen: Normal in size and appearance. Adrenals/Urinary Tract: Normal appearing adrenal glands. Calculi in both kidneys, including 2 calculi in adjacent upper pole calices of the LEFT kidney on the order 2-3 mm, a small 1 2 mm calculus in a mid calyx of the LEFT kidney, a 5 mm calculus in a pole calyx of the LEFT kidney and a small 1-2 mm calculus in a LOWER pole calyx of the RIGHT kidney. No ureteral calculi on either side. No evidence of hydronephrosis. No focal parenchymal abnormality involving either kidney. Normal appearing urinary bladder. Stomach/Bowel: Stomach normal in appearance for the degree of distention. Mild dilation of a few loops of jejunum in the upper abdomen, with associated mild wall thickening, though there is no edema/inflammation in the surrounding mesenteric fat. Remaining small bowel normal in appearance. Moderate stool burden throughout the normal appearing colon. Normal appendix in the RIGHT upper pelvis. Lymphatic: No pathologic lymphadenopathy. Reproductive: Surgically absent uterus.  No adnexal masses. Other: None. Musculoskeletal: Regional skeleton unremarkable without acute or significant osseous abnormality. Review of the MIP images confirms the above findings. IMPRESSION: 1. No evidence of thoracic or abdominal aortic dissection. 2. Ascending thoracic aortic aneurysm measuring  approximately 3.9 cm in diameter (corresponding with the size identified at echocardiography per Dr. Kyla Balzarine note from earlier today). 3. Solitary calcified and noncalcified plaque in the LAD. 4. 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 5. Bilateral non-obstructing renal calculi. 6. Mild dilation of a few loops of jejunum involving the upper abdomen, with associated mild wall thickening, though there is no edema/inflammation in the surrounding mesenteric fat. This may be due to a localized ileus or enteritis. 7. Aortic Atherosclerosis (ICD10-I70.0), mild. I telephoned these results at the time of interpretation to Dr. Johnsie Cancel of cardiology 11/06/2020 at 6:54 p.m. Electronically Signed   By: Evangeline Dakin M.D.   On: 11/06/2020 19:10    Cardiac Studies:  ECG: None today    Telemetry:  SR, Sinus brady 50s 11/08/2020   Echo:  Normal EF 60-65% bicuspid AV with mean gradient 28.5 mmHg peak 49.4 mmHg DVI 0.31 AVA 1.22 cm2   No significant change from May 2021   Medications:   . aspirin EC  81 mg Oral Daily  . atorvastatin  80 mg Oral Daily  . escitalopram  5 mg  Oral Daily  . estradiol  2 mg Oral Daily  . metoprolol tartrate  25 mg Oral BID  . sodium chloride flush  3 mL Intravenous Q12H     . sodium chloride Stopped (11/07/20 1014)  . sodium chloride    . sodium chloride      Assessment/Plan:   1. Chest Pain In setting of GI illness and bicuspid AV.  - peak trop 536, no acute ECG changes -  Per PN, more likely viral myocarditis or coronary dissection - heparin d/c'd - R/L cath today, may need MRI - cont ASA, BB, statin  2. AS:  Bicuspid AV - echo results above, no significant change - mean gradient 28.5, peak 49.4 - R heart cath today  3. Aorta:   - Ao root 3.9 cm, no pathology by gated CT, done for dissection  Olivia Mcgrath 11/08/2020, 7:02 AM    Patient seen and examined.  Agree with above  documentation.  On exam, patient is alert and oriented, regular rate and rhythm, 3/6 systolic murmur, lungs CTAB, no LE edema or JVD. Cath with normal coronaries, moderate AS.  Recommend cardiac MRI to evaluate for myocarditis.  Donato Heinz, MD

## 2020-11-09 ENCOUNTER — Telehealth: Payer: Self-pay | Admitting: Student

## 2020-11-09 ENCOUNTER — Encounter (HOSPITAL_COMMUNITY): Payer: Self-pay | Admitting: Cardiovascular Disease

## 2020-11-09 ENCOUNTER — Inpatient Hospital Stay (HOSPITAL_COMMUNITY): Payer: BC Managed Care – PPO

## 2020-11-09 DIAGNOSIS — I514 Myocarditis, unspecified: Secondary | ICD-10-CM

## 2020-11-09 DIAGNOSIS — I409 Acute myocarditis, unspecified: Secondary | ICD-10-CM

## 2020-11-09 HISTORY — PX: OTHER SURGICAL HISTORY: SHX169

## 2020-11-09 HISTORY — DX: Myocarditis, unspecified: I51.4

## 2020-11-09 LAB — BASIC METABOLIC PANEL
Anion gap: 7 (ref 5–15)
BUN: 14 mg/dL (ref 6–20)
CO2: 26 mmol/L (ref 22–32)
Calcium: 8.7 mg/dL — ABNORMAL LOW (ref 8.9–10.3)
Chloride: 105 mmol/L (ref 98–111)
Creatinine, Ser: 0.64 mg/dL (ref 0.44–1.00)
GFR, Estimated: >60 mL/min (ref >60)
Glucose, Bld: 95 mg/dL (ref 70–99)
Potassium: 4.1 mmol/L (ref 3.5–5.1)
Sodium: 138 mmol/L (ref 135–145)

## 2020-11-09 LAB — CBC
HCT: 40.9 % (ref 36.0–46.0)
Hemoglobin: 13.9 g/dL (ref 12.0–15.0)
MCH: 31.6 pg (ref 26.0–34.0)
MCHC: 34 g/dL (ref 30.0–36.0)
MCV: 93 fL (ref 80.0–100.0)
Platelets: 218 10*3/uL (ref 150–400)
RBC: 4.4 MIL/uL (ref 3.87–5.11)
RDW: 11.8 % (ref 11.5–15.5)
WBC: 6.6 10*3/uL (ref 4.0–10.5)
nRBC: 0 % (ref 0.0–0.2)

## 2020-11-09 MED ORDER — DIAZEPAM 5 MG PO TABS
5.0000 mg | ORAL_TABLET | Freq: Once | ORAL | Status: AC
  Administered 2020-11-09: 5 mg via ORAL
  Filled 2020-11-09: qty 1

## 2020-11-09 MED ORDER — GADOBUTROL 1 MMOL/ML IV SOLN
8.0000 mL | Freq: Once | INTRAVENOUS | Status: AC | PRN
  Administered 2020-11-09: 8 mL via INTRAVENOUS

## 2020-11-09 MED ORDER — COLCHICINE 0.6 MG PO TABS: 0.6000 mg | ORAL_TABLET | Freq: Two times a day (BID) | ORAL | 2 refills | Status: DC

## 2020-11-09 MED FILL — Heparin Sod (Porcine)-NaCl IV Soln 1000 Unit/500ML-0.9%: INTRAVENOUS | Qty: 1000 | Status: AC

## 2020-11-09 NOTE — Telephone Encounter (Signed)
Correction- patient was discharged today.

## 2020-11-09 NOTE — Telephone Encounter (Signed)
TOC Visit made per Rosaria Ferries to see Sande Rives on 12/03/20 at 11:15 am

## 2020-11-09 NOTE — Discharge Instructions (Signed)
Heart Attack The heart is a muscle that needs oxygen to survive. A heart attack is a condition that occurs when your heart does not get enough oxygen. When this happens, the heart muscle begins to die. This can cause permanent damage if not treated right away. A heart attack is a medical emergency. This condition may be called a myocardial infarction, or MI. It is also known as acute coronary syndrome (ACS). ACS is a term used to describe a group of conditions that affect blood flow to the heart. What are the causes? This condition may be caused by:  Atherosclerosis. This occurs when a fatty substance called plaque builds up in the arteries and blocks or reduces blood supply to the heart.  A blood clot. A blood clot can develop suddenly when plaque breaks up within an artery and blocks blood flow to the heart.  Low blood pressure.  An abnormal heartbeat (arrhythmia).  Conditions that cause a decrease of oxygen to the heart, such as anemiaorrespiratory failure.  A spasm, or severe tightening, of a blood vessel that cuts off blood flow to the heart.  Tearing of a coronary artery (spontaneous coronary artery dissection).  High blood pressure.   What increases the risk? The following factors may make you more likely to develop this condition:  Aging. The older you are, the higher your risk.  Having a personal or family history of chest pain, heart attack, stroke, or narrowing of the arteries in the legs, arms, head, or stomach (peripheral artery disease).  Being female.  Smoking.  Not getting regular exercise.  Being overweight or obese.  Having high blood pressure.  Having high cholesterol (hypercholesterolemia).  Having diabetes.  Drinking too much alcohol.  Using illegal drugs, such as cocaine or methamphetamine. What are the signs or symptoms? Symptoms of this condition may vary, depending on factors like gender and age. Symptoms may include:  Chest pain. It may feel  like: ? Crushing or squeezing. ? Tightness, pressure, fullness, or heaviness.  Pain in the arm, neck, jaw, back, or upper body.  Shortness of breath.  Heartburn or upset stomach.  Nausea.  Sudden cold sweats.  Feeling tired.  Sudden light-headedness. How is this diagnosed? This condition may be diagnosed through tests, such as:  Electrocardiogram (ECG) to measure the electrical activity of your heart.  Blood tests to check for cardiac markers. These chemicals are released by a damaged heart muscle.  A test to evaluate blood flow and heart function (coronary angiogram).  CT scan to see the heart more clearly.  A test to evaluate the pumping action of the heart (echocardiogram). How is this treated? A heart attack must be treated as soon as possible. Treatment may include:  Medicines to: ? Break up or dissolve blood clots (fibrinolytic therapy). ? Thin blood and help prevent blood clots. ? Treat blood pressure. ? Improve blood flow to the heart. ? Reduce pain. ? Reduce cholesterol.  Angioplasty and stent placement. These are procedures to widen a blocked artery and keep it open.  Coronary artery bypass graft, CABG, or open heart surgery. This enables blood to flow to the heart by going around the blocked part of the artery.  Oxygen therapy if needed.  Cardiac rehabilitation. This improves your health and well-being through exercise, education, and counseling.   Follow these instructions at home: Medicines  Take over-the-counter and prescription medicines only as told by your health care provider.  Do not take the following medicines unless your health care provider  says it is okay to take them: ? NSAIDs, such as ibuprofen. ? Supplements that contain vitamin A, vitamin E, or both. ? Hormone replacement therapy that contains estrogen with or without progestin. Lifestyle  Do not use any products that contain nicotine or tobacco, such as cigarettes, e-cigarettes,  and chewing tobacco. If you need help quitting, ask your health care provider.  Avoid secondhand smoke.  Exercise regularly. Ask your health care provider about participating in a cardiac rehabilitation program that helps you start exercising safely after a heart attack.  Eat a heart-healthy diet. Your health care provider will tell you what foods to eat.  Maintain a healthy weight.  Learn ways to manage stress.  Do not use illegal drugs.   Alcohol use  Do not drink alcohol if: ? Your health care provider tells you not to drink. ? You are pregnant, may be pregnant, or are planning to become pregnant.  If you drink alcohol: ? Limit how much you use to:  0-1 drink a day for women.  0-2 drinks a day for men. ? Be aware of how much alcohol is in your drink. In the [place] one drink equals one 12 oz bottle of beer (355 mL), one 5 oz glass of wine (148 mL), or one 1 oz glass of hard liquor (44 mL). General instructions  Work with your health care provider to manage any other conditions you have, such as high blood pressure or diabetes. These conditions affect your heart.  Get screened for depression, and seek treatment if needed.  Keep your vaccinations up to date. Get the flu vaccine every year.  Keep all follow-up visits as told by your health care provider. This is important. Contact a health care provider if:  You feel overwhelmed or sad.  You have trouble doing your daily activities. Get help right away if:  You have sudden, unexplained discomfort in your chest, arms, back, neck, jaw, or upper body.  You have shortness of breath.  You suddenly start to sweat or your skin gets clammy.  You feel nauseous or you vomit.  You have unexplained tiredness or weakness.  You suddenly feel light-headed or dizzy.  You notice your heart starts to beat fast or feels like it is skipping beats.  You have blood pressure that is higher than 180/120. These symptoms may represent a  serious problem that is an emergency. Do not wait to see if the symptoms will go away. Get medical help right away. Call your local emergency services (911 in the [place] Do not drive yourself to the hospital. Summary  A heart attack, also called myocardial infarction, is a condition that occurs when your heart does not get enough oxygen. This is caused by anything that blocks or reduces blood flow to the heart.  Treatment is a combination of medicines and surgeries, if needed, to open the blocked arteries and restore blood flow to the heart.  A heart attack is an emergency. Get help right away if you have sudden discomfort in your chest, arms, back, neck, jaw, or upper body. Seek help if you feel nauseous, you vomit, or you feel light-headed or dizzy. This information is not intended to replace advice given to you by your health care provider. Make sure you discuss any questions you have with your health care provider. Document Revised: 10/24/2018 Document Reviewed: 10/28/2018 Elsevier Patient Education  [place].

## 2020-11-09 NOTE — Progress Notes (Addendum)
Subjective:   Had brief chest pain last pm, resolved quickly, w/out intervention  Objective:  Vitals:   11/08/20 1710 11/08/20 2016 11/09/20 0616 11/09/20 0845  BP: 118/62 109/62 125/75 108/69  Pulse: 62 60 63 67  Resp: 16 17 18    Temp: 97.6 F (36.4 C) 97.9 F (36.6 C) 98 F (36.7 C)   TempSrc: Oral Oral Oral   SpO2: 97% 96% 98%   Weight:   80.9 kg   Height:        Intake/Output from previous day:  Intake/Output Summary (Last 24 hours) at 11/09/2020 0856 Last data filed at 11/08/2020 2030 Gross per 24 hour  Intake 1381.17 ml  Output --  Net 1381.17 ml    Physical Exam: General: Well developed, well nourished, female in no acute distress Head: Eyes PERRLA, Head normocephalic and atraumatic Lungs: clear bilaterally to auscultation. Heart: HRRR S1 S2, without rub or gallop. 3/6 AS murmur. 4/4 extremity pulses are 2+ & equal. No JVD. Abdomen: Bowel sounds are present, abdomen soft and non-tender without masses or  hernias noted. Msk: Normal strength and tone for age. Extremities: No clubbing, cyanosis or edema. R radial cath site w/out ecchymosis or hematom Skin:  No rashes or lesions noted. Neuro: Alert and oriented X 3. Psych:  Good affect, responds appropriately  Lab Results: Basic Metabolic Panel: Recent Labs    11/07/20 0118 11/08/20 0530 11/08/20 1326 11/08/20 1347 11/08/20 1348  NA 141 138   < > 140 140  K 3.3* 4.6   < > 3.9 3.9  CL 106 104  --   --   --   CO2 27 30  --   --   --   GLUCOSE 120* 90  --   --   --   BUN 10 15  --   --   --   CREATININE 0.59 0.71  --   --   --   CALCIUM 8.2* 9.0  --   --   --    < > = values in this interval not displayed.   Liver Function Tests: Recent Labs    11/06/20 1145  AST 26  ALT 20  ALKPHOS 58  BILITOT 0.5  PROT 6.5  ALBUMIN 3.8   Recent Labs    11/06/20 1145  LIPASE 32   CBC: Recent Labs    11/08/20 0530 11/08/20 1326 11/08/20 1348 11/09/20 0419  WBC 5.9  --   --  6.6  HGB 14.5   < >  12.9 13.9  HCT 43.0   < > 38.0 40.9  MCV 92.7  --   --  93.0  PLT 213  --   --  218   < > = values in this interval not displayed.   Cardiac Enzymes:   High Sensitivity Troponin:  Recent Labs  Lab 11/06/20 1145 11/06/20 1251 11/06/20 1557  TROPONINIHS 359* 424* 536*      BNP: Invalid input(s): POCBNP D-Dimer: No results for input(s): DDIMER in the last 72 hours. Hemoglobin A1C: Recent Labs    11/07/20 0118  HGBA1C 5.2   Fasting Lipid Panel: Recent Labs    11/06/20 1557  CHOL 223*  HDL 58  LDLCALC 146*  TRIG 93  CHOLHDL 3.8   Thyroid Function Tests: Recent Labs    11/06/20 1557  TSH 1.561   Anemia Panel: No results for input(s): VITAMINB12, FOLATE, FERRITIN, TIBC, IRON, RETICCTPCT in the last 72 hours.  Imaging: CARDIAC CATHETERIZATION  Result Date: 11/08/2020  The left ventricular systolic function is normal.  LV end diastolic pressure is normal.  The left ventricular ejection fraction is 55-65% by visual estimate.  There is moderate aortic valve stenosis.  1. Normal coronary anatomy 2. Moderate Aortic stenosis. Mean gradient 30 mm Hg. AVA 1.16 cm squared with index 0.58 3. Normal LV filling pressures 4. Normal right heart pressures. 5. Normal cardiac output. Plan; no clear cause for chest pain identified. Medical management.   ECHOCARDIOGRAM COMPLETE  Result Date: 11/08/2020    ECHOCARDIOGRAM REPORT   Patient Name:   Olivia Mcgrath Date of Exam: 11/08/2020 Medical Rec #:  [id number]    Height:       69.5 in Accession #:    [id number]   Weight:       178.2 lb Date of Birth:  1960/09/11    BSA:          1.977 m Patient Age:    60 years     BP:           122/73 mmHg Patient Gender: F            HR:           56 bpm. Exam Location:  Inpatient Procedure: 2D Echo, Cardiac Doppler and Color Doppler Indications:    Repeat complete echo for AV and aorta  History:        Patient has prior history of Echocardiogram examinations, most                 recent 11/06/2020. Known  bicuspid AV.  Sonographer:    Luisa Hart RDCS Referring Phys: [place]  1. Left ventricular ejection fraction, by estimation, is 60 to 65%. The left ventricle has normal function. The left ventricle has no regional wall motion abnormalities. There is mild left ventricular hypertrophy. Left ventricular diastolic parameters are consistent with Grade I diastolic dysfunction (impaired relaxation).  2. Right ventricular systolic function is normal. The right ventricular size is normal. There is normal pulmonary artery systolic pressure. The estimated right ventricular systolic pressure is 60.1 mmHg.  3. Left atrial size was mildly dilated.  4. The mitral valve is abnormal. Trivial mitral valve regurgitation.  5. The aortic valve is bicuspid and moderately calcified. 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 Aortic valve regurgitation is not visualized. Moderate to severe aortic valve stenosis. Aortic valve area, by VTI measures 0.90 cm. Aortic valve mean gradient measures 44.0 mmHg. Aortic valve Vmax measures 4.21 m/s. DI is 0.24.  6. Aortic dilatation noted. There is mild dilatation of the ascending aorta, measuring 39 mm.  7. The inferior vena cava is normal in size with greater than 50% respiratory variability, suggesting right atrial pressure of 3 mmHg. Comparison(s): Changes from prior study are noted. 11/06/2020: LVEF 60-65%, AOV mean gradient 28.5 mmHg, AVA 1.22 cm2, DI 0.31. FINDINGS  Left Ventricle: Left ventricular ejection fraction, by estimation, is 60 to 65%. The left ventricle has normal function. The left ventricle has no regional wall motion abnormalities. The left ventricular internal cavity size was normal in size. There is  mild left ventricular hypertrophy. Left ventricular diastolic parameters are consistent with Grade I diastolic dysfunction (impaired relaxation). Indeterminate filling pressures. Right Ventricle: The right ventricular size is normal. No  increase in right ventricular wall thickness. Right ventricular systolic function is normal. There is normal pulmonary artery systolic pressure. The tricuspid regurgitant velocity is 2.60 m/s, and  with an assumed right  atrial pressure of 3 mmHg, the estimated right ventricular systolic pressure is 89.3 mmHg. Left Atrium: Left atrial size was mildly dilated. Right Atrium: Right atrial size was normal in size. Pericardium: There is no evidence of pericardial effusion. Mitral Valve: The mitral valve is abnormal. There is mild thickening of the mitral valve leaflet(s). Trivial mitral valve regurgitation. MV peak gradient, 4.2 mmHg. The mean mitral valve gradient is 2.0 mmHg. Tricuspid Valve: The tricuspid valve is grossly normal. Tricuspid valve regurgitation is trivial. Aortic Valve: The aortic valve is bicuspid. There is moderate calcification of the aortic valve. There is moderate thickening of the aortic valve. Aortic valve regurgitation is not visualized. Moderate to severe aortic stenosis is present. Aortic valve mean gradient measures 44.0 mmHg. Aortic valve peak gradient measures 70.9 mmHg. Aortic valve area, by VTI measures 0.90 cm. Pulmonic Valve: The pulmonic valve was grossly normal. Pulmonic valve regurgitation is trivial. Aorta: Aortic dilatation noted. There is mild dilatation of the ascending aorta, measuring 39 mm. Venous: The inferior vena cava is normal in size with greater than 50% respiratory variability, suggesting right atrial pressure of 3 mmHg. IAS/Shunts: No atrial level shunt detected by color flow Doppler.  LEFT VENTRICLE PLAX 2D LVIDd:         4.00 cm     Diastology LVIDs:         2.70 cm     LV e' medial:    6.09 cm/s LV PW:         1.50 cm     LV E/e' medial:  15.5 LV IVS:        0.80 cm     LV e' lateral:   7.29 cm/s LVOT diam:     2.20 cm     LV E/e' lateral: 13.0 LV SV:         101 LV SV Index:   51 LVOT Area:     3.80 cm  LV Volumes (MOD) LV vol d, MOD A2C: 59.8 ml LV vol d, MOD  A4C: 54.3 ml LV vol s, MOD A2C: 22.3 ml LV vol s, MOD A4C: 22.6 ml LV SV MOD A2C:     37.5 ml LV SV MOD A4C:     54.3 ml LV SV MOD BP:      34.9 ml RIGHT VENTRICLE RV S prime:     11.20 cm/s TAPSE (M-mode): 2.4 cm LEFT ATRIUM             Index       RIGHT ATRIUM           Index LA diam:        3.90 cm 1.97 cm/m  RA Area:     15.80 cm LA Vol (A2C):   70.6 ml 35.71 ml/m RA Volume:   38.50 ml  19.48 ml/m LA Vol (A4C):   60.4 ml 30.55 ml/m LA Biplane Vol: 66.9 ml 33.84 ml/m  AORTIC VALVE                    PULMONIC VALVE AV Area (Vmax):    0.96 cm     PV Vmax:       0.79 m/s AV Area (Vmean):   0.96 cm     PV Peak grad:  2.5 mmHg AV Area (VTI):     0.90 cm AV Vmax:           421.00 cm/s AV Vmean:          313.000 cm/s AV  VTI:            1.130 m AV Peak Grad:      70.9 mmHg AV Mean Grad:      44.0 mmHg LVOT Vmax:         106.00 cm/s LVOT Vmean:        78.800 cm/s LVOT VTI:          0.266 m LVOT/AV VTI ratio: 0.24  AORTA Ao Root diam: 3.00 cm Ao Asc diam:  3.90 cm MITRAL VALVE                 TRICUSPID VALVE MV Area (PHT): 3.08 cm      TR Peak grad:   27.0 mmHg MV Area VTI:   2.69 cm      TR Vmax:        260.00 cm/s MV Peak grad:  4.2 mmHg MV Mean grad:  2.0 mmHg      SHUNTS MV Vmax:       1.03 m/s      Systemic VTI:  0.27 m MV Vmean:      59.2 cm/s     Systemic Diam: 2.20 cm MV Decel Time: 246 msec MR Peak grad:    51.3 mmHg MR Mean grad:    38.0 mmHg MR Vmax:         358.00 cm/s MR Vmean:        295.0 cm/s MR PISA:         0.25 cm MR PISA Eff ROA: 2 mm MR PISA Radius:  0.20 cm MV E velocity: 94.60 cm/s MV A velocity: 89.20 cm/s MV E/A ratio:  1.06 Lyman Bishop MD Electronically signed by Lyman Bishop MD Signature Date/Time: 11/08/2020/11:30:52 AM    Final     Cardiac Studies:  ECG: None today    Telemetry:  SR, Sinus brady 50s 11/09/2020   Cath:  11/08/2020  The left ventricular systolic function is normal.  LV end diastolic pressure is normal.  The left ventricular ejection fraction is 55-65%  by visual estimate.  There is moderate aortic valve stenosis.   1. Normal coronary anatomy 2. Moderate Aortic stenosis. Mean gradient 30 mm Hg. AVA 1.16 cm squared with index 0.58 3. Normal LV filling pressures 4. Normal right heart pressures. 5. Normal cardiac output.  Plan; no clear cause for chest pain identified. Medical management.   Echo:  Normal EF 60-65% bicuspid AV with mean gradient 28.5 mmHg peak 49.4 mmHg DVI 0.31 AVA 1.22 cm2   No significant change from May 2021   Medications:   . aspirin EC  81 mg Oral Daily  . atorvastatin  80 mg Oral Daily  . escitalopram  5 mg Oral Daily  . estradiol  2 mg Oral Daily  . fluticasone  2 spray Each Nare Daily  . metoprolol tartrate  25 mg Oral BID  . sodium chloride flush  3 mL Intravenous Q12H  . sodium chloride flush  3 mL Intravenous Q12H     . sodium chloride Stopped (11/07/20 1014)  . sodium chloride      Assessment/Plan:   1. Chest Pain In setting of GI illness and bicuspid AV.  - peak trop 536 - GI sx have resolved - cath w/ no dz - EF nl - concern for myocarditis >> MRI today - on ASA, BB, statin  2. AS:  Bicuspid AV - echo and cath results above - no change in gradients - follow  3. Aorta:   -  Root thought to be dilated but was upper normal on gated CT  Rhonda Barrett 11/09/2020, 8:56 AM   Patient seen and examined.  Agree with above documentation.  On exam, patient is alert and oriented, regular rate and rhythm, 3/6 systolic murmur, lungs CTAB, no LE edema or JVD.  Brief episode of chest pain last night.  Telemetry shows NSR with rate 50s. Plan cardiac MRI today to evaluate for myocarditis.  Donato Heinz, MD

## 2020-11-09 NOTE — Discharge Summary (Addendum)
Discharge Summary    Patient ID: Olivia Mcgrath MRN: [id number]; DOB: [birth year]  Admit date: 11/06/2020 Discharge date: 11/09/2020  Primary Care Provider: Jene Every, MD  Primary Cardiologist: Glenetta Hew, MD  Primary Electrophysiologist:  None   Discharge Diagnoses    Principal Problem:   Elevated troponin Active Problems:   Chest pain   Allergies Allergies  Allergen Reactions  . Sulfa Antibiotics Rash    Diagnostic Studies/Procedures    ECHO: 11/08/2020 1. Left ventricular ejection fraction, by estimation, is 60 to 65%. The  left ventricle has normal function. The left ventricle has no regional  wall motion abnormalities. There is mild left ventricular hypertrophy.  Left ventricular diastolic parameters  are consistent with Grade I diastolic dysfunction (impaired relaxation).  2. Right ventricular systolic function is normal. The right ventricular  size is normal. There is normal pulmonary artery systolic pressure. The  estimated right ventricular systolic pressure is 97.6 mmHg.  3. Left atrial size was mildly dilated.  4. The mitral valve is abnormal. Trivial mitral valve regurgitation.  5. The aortic valve is bicuspid and moderately calcified. There appear to  be fusion of the non and left coronary cusps. Aortic valve regurgitation  is not visualized. Moderate to severe aortic valve stenosis. Aortic valve  area, by VTI measures 0.90 cm.  Aortic valve mean gradient measures 44.0 mmHg. Aortic valve Vmax measures  4.21 m/s. DI is 0.24.  6. Aortic dilatation noted. There is mild dilatation of the ascending  aorta, measuring 39 mm.  7. The inferior vena cava is normal in size with greater than 50%  respiratory variability, suggesting right atrial pressure of 3 mmHg.   Comparison(s): Changes from prior study are noted. 11/06/2020: LVEF 60-65%,  AOV mean gradient 28.5 mmHg, AVA 1.22 cm2, DI 0.31.     Cath:  11/08/2020  The left ventricular systolic function  is normal.  LV end diastolic pressure is normal.  The left ventricular ejection fraction is 55-65% by visual estimate.  There is moderate aortic valve stenosis.  1. Normal coronary anatomy 2. Moderate Aortic stenosis. Mean gradient 30 mm Hg. AVA 1.16 cm squared with index 0.58 3. Normal LV filling pressures 4. Normal right heart pressures. 5. Normal cardiac output.  Plan; no clear cause for chest pain identified. Medical management.  ECHO: 11/06/2020 1. Left ventricular ejection fraction, by estimation, is 60 to 65%. The  left ventricle has normal function. The left ventricle has no regional  wall motion abnormalities. The left ventricular internal cavity size was  mildly dilated. Left ventricular  diastolic parameters are consistent with Grade I diastolic dysfunction  (impaired relaxation).  2. Right ventricular systolic function is normal. The right ventricular  size is normal.  3. Left atrial size was mildly dilated.  4. The mitral valve is normal in structure. Trivial mitral valve  regurgitation. No evidence of mitral stenosis.  5. Not well visualized but appears / known to be bicusid mean gradient  stable 28.5 mmHg was 30 mmHg May 2021 However AVA/DVI more consistent now  with moderate and not severe AS AVA 1.22 cm2 and DVI 0.31 . The aortic  valve is bicuspid. There is moderate  calcification of the aortic valve. There is moderate thickening of the  aortic valve. Aortic valve regurgitation is not visualized. Moderate to  severe aortic valve stenosis.  6. May 2021 previous aortic root measured 3.9 only 3.5 on current study  No evidence of aortic dissection, Sinus of valsalva aneurysm or other root  pathology No  AR and no pericardial effusion.  7. The inferior vena cava is normal in size with greater than 50%  respiratory variability, suggesting right atrial pressure of 3 mmHg.  _____________   History of Present Illness     Olivia Mcgrath is a 60 y.o. female  with known bicuspid AoV w/ mod-severe AS, no other cardiac issues, hx COVID w/ last on 07/2020, developed chest pain and nausea the day of admission, after 2 days of general malaise. Pain radiated to her L arm. Tums no help. Came to ER, ECG not acute but initial troponin elevated, Cards admitted.  Hospital Course     Consultants: None   1. Chest Pain In setting of bicuspid AV.  - peak trop 536, no acute ECG changes -  Dr Johnsie Cancel and Dr Gardiner Rhyme, were concerned about viral myocarditis or coronary dissection >> Cath performed 04/11 and was without CAD. 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  Started on colchicine for planned 3 month course. - Although has some elevation in LDL, does not meet criteria for statin and BP not elevated at baseline, so will not be on BB, statin at d/c.  - at this time, continue to follow for sx  2. AS:  Bicuspid AV - echo results above, no significant change - mean gradient 28.5, peak 49.4 - R heart cath pressures ok at cath  3. Aorta:   - Ao root 3.9 cm, no pathology by gated CT -follow up as outpt    Did the patient have an acute coronary syndrome (MI, NSTEMI, STEMI, etc) this admission?:  No                               Did the patient have a percutaneous coronary intervention (stent / angioplasty)?:  No.   _____________  Discharge Vitals Blood pressure 123/71, pulse 66, temperature 97.8 F (36.6 C), temperature source Oral, resp. rate 16, height 5' 9.5" (1.765 m), weight 80.9 kg, SpO2 100 %.  Filed Weights   11/06/20 1252 11/07/20 0440 11/09/20 0616  Weight: 79.4 kg 80.8 kg 80.9 kg    Labs & Radiologic Studies    CBC Recent Labs    11/08/20 0530 11/08/20 1326 11/08/20 1348 11/09/20 0419  WBC 5.9  --   --  6.6  HGB 14.5   < > 12.9 13.9  HCT 43.0   < > 38.0 40.9  MCV 92.7  --   --  93.0  PLT 213  --   --  218   < > = values in this interval not displayed.   Basic Metabolic Panel Recent Labs    11/08/20 0530 11/08/20 1326  11/08/20 1348 11/09/20 0419  NA 138   < > 140 138  K 4.6   < > 3.9 4.1  CL 104  --   --  105  CO2 30  --   --  26  GLUCOSE 90  --   --  95  BUN 15  --   --  14  CREATININE 0.71  --   --  0.64  CALCIUM 9.0  --   --  8.7*   < > = values in this interval not displayed.   Liver Function Tests Lab Results  Component Value Date   ALT 20 11/06/2020   AST 26 11/06/2020   ALKPHOS 58 11/06/2020   BILITOT 0.5 11/06/2020   High Sensitivity Troponin:   Recent Labs  Lab 11/06/20 1145 11/06/20 1251 11/06/20 1557  TROPONINIHS 359* 424* 536*    Hemoglobin A1C Recent Labs    11/07/20 0118  HGBA1C 5.2   Fasting Lipid Panel Lab Results  Component Value Date   CHOL 223 (H) 11/06/2020   HDL 58 11/06/2020   LDLCALC 146 (H) 11/06/2020   TRIG 93 11/06/2020   CHOLHDL 3.8 11/06/2020    Thyroid Function Tests Lab Results  Component Value Date   TSH 1.561 11/06/2020    _____________  DG Chest 2 View  Result Date: 11/06/2020 CLINICAL DATA:  Chest pain and shortness of breath. EXAM: CHEST - 2 VIEW COMPARISON:  Chest x-ray dated March 13, 2020. FINDINGS: The heart size and mediastinal contours are within normal limits. Both lungs are clear. The visualized skeletal structures are unremarkable. IMPRESSION: No active cardiopulmonary disease. Electronically Signed   By: Titus Dubin M.D.   On: 11/06/2020 12:29   CT Angio Chest PE W and/or Wo Contrast  Result Date: 11/06/2020 CLINICAL DATA:  Central chest pain radiating to the back. Positive D-dimer. EXAM: CT ANGIOGRAPHY CHEST WITH CONTRAST TECHNIQUE: Multidetector CT imaging of the chest was performed using the standard protocol during bolus administration of intravenous contrast. Multiplanar CT image reconstructions and MIPs were obtained to evaluate the vascular anatomy. CONTRAST:  58mL OMNIPAQUE IOHEXOL 350 MG/ML SOLN COMPARISON:  None. FINDINGS: Cardiovascular: Satisfactory opacification of the pulmonary arteries to the segmental level.  No evidence of pulmonary embolism. Normal heart size. No pericardial effusion. There is a fusiform aneurysmal dilation of the ascending aorta, from the root of the aorta to the aortic arch. The ascending aorta measures 4.1 cm. The aortic arch measures 3.5 cm. The descending aorta reverts to normal caliber measuring 2.8 cm. There is bulbous appearance of the aortic valve annulus, which may be involved, not well visualized. Mediastinum/Nodes: No enlarged mediastinal, hilar, or axillary lymph nodes. Thyroid gland, trachea, and esophagus demonstrate no significant findings. Lungs/Pleura: Wispy symmetric opacities in the dependent portions of the lungs are suggestive of mild interstitial pulmonary edema. 4 mm perifissural right middle lobe soft tissue nodule. 2 mm subpleural right middle lobe soft tissue nodule is stable from 2013, benign. Upper Abdomen: No acute abnormality. Musculoskeletal: No chest wall abnormality. No acute or significant osseous findings. Review of the MIP images confirms the above findings. IMPRESSION: 1. No evidence of pulmonary embolus. 2. 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 The ascending aorta measures 4.1 cm. 3. Bulbous appearance of the aortic valve annulus, which may be involved, not well visualized. Further evaluation with cardiac echo may be considered. 4. Wispy symmetric opacities in the dependent portions of the lungs are suggestive of mild interstitial pulmonary edema. 5. Cardiology and cardiothoracic surgery consultation is recommended. 6. 4 mm peri fissural right middle lobe soft tissue nodule. No follow-up needed if patient is low-risk. Non-contrast chest CT can be considered in 12 months if patient is high-risk. This recommendation follows the consensus statement: Guidelines for Management of Incidental Pulmonary Nodules Detected on CT Images: From the Fleischner Society 2017; Radiology 2017; 284:228-243. 7. These  results were called by telephone at the time of interpretation on 11/06/2020 at 2:21 pm to Dr. Pattricia Boss , who verbally acknowledged these results. Electronically Signed   By: Fidela Salisbury M.D.   On: 11/06/2020 14:23   CARDIAC CATHETERIZATION  Result Date: 11/08/2020  The left ventricular systolic function is normal.  LV end diastolic pressure is normal.  The left ventricular ejection fraction is 55-65% by visual estimate.  There is moderate aortic valve stenosis.  1. Normal coronary anatomy 2. Moderate Aortic stenosis. Mean gradient 30 mm Hg. AVA 1.16 cm squared with index 0.58 3. Normal LV filling pressures 4. Normal right heart pressures. 5. Normal cardiac output. Plan; no clear cause for chest pain identified. Medical management.   ECHOCARDIOGRAM COMPLETE  Result Date: 11/08/2020    ECHOCARDIOGRAM REPORT   Patient Name:   REVA PINKLEY Date of Exam: 11/08/2020 Medical Rec #:  [id number]    Height:       69.5 in Accession #:    [id number]   Weight:       178.2 lb Date of Birth:  1960-09-27    BSA:          1.977 m Patient Age:    60 years     BP:           122/73 mmHg Patient Gender: F            HR:           56 bpm. Exam Location:  Inpatient Procedure: 2D Echo, Cardiac Doppler and Color Doppler Indications:    Repeat complete echo for AV and aorta  History:        Patient has prior history of Echocardiogram examinations, most                 recent 11/06/2020. Known bicuspid AV.  Sonographer:    Luisa Hart RDCS Referring Phys: [place]  1. Left ventricular ejection fraction, by estimation, is 60 to 65%. The left ventricle has normal function. The left ventricle has no regional wall motion abnormalities. There is mild left ventricular hypertrophy. Left ventricular diastolic parameters are consistent with Grade I diastolic dysfunction (impaired relaxation).  2. Right ventricular systolic function is normal. The right ventricular size is normal. There is normal pulmonary  artery systolic pressure. The estimated right ventricular systolic pressure is 16.0 mmHg.  3. Left atrial size was mildly dilated.  4. The mitral valve is abnormal. Trivial mitral valve regurgitation.  5. The aortic valve is bicuspid and moderately calcified. 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 Aortic valve regurgitation is not visualized. Moderate to severe aortic valve stenosis. Aortic valve area, by VTI measures 0.90 cm. Aortic valve mean gradient measures 44.0 mmHg. Aortic valve Vmax measures 4.21 m/s. DI is 0.24.  6. Aortic dilatation noted. There is mild dilatation of the ascending aorta, measuring 39 mm.  7. The inferior vena cava is normal in size with greater than 50% respiratory variability, suggesting right atrial pressure of 3 mmHg. Comparison(s): Changes from prior study are noted. 11/06/2020: LVEF 60-65%, AOV mean gradient 28.5 mmHg, AVA 1.22 cm2, DI 0.31. FINDINGS  Left Ventricle: Left ventricular ejection fraction, by estimation, is 60 to 65%. The left ventricle has normal function. The left ventricle has no regional wall motion abnormalities. The left ventricular internal cavity size was normal in size. There is  mild left ventricular hypertrophy. Left ventricular diastolic parameters are consistent with Grade I diastolic dysfunction (impaired relaxation). Indeterminate filling pressures. Right Ventricle: The right ventricular size is normal. No increase in right ventricular wall thickness. Right ventricular systolic function is normal. There is normal pulmonary artery systolic pressure. The tricuspid regurgitant velocity is 2.60 m/s, and  with an assumed right atrial pressure of 3 mmHg, the estimated right ventricular systolic pressure is 10.9 mmHg. Left Atrium:  Left atrial size was mildly dilated. Right Atrium: Right atrial size was normal in size. Pericardium: There is no evidence of pericardial effusion. Mitral Valve: The mitral valve is abnormal. There is mild  thickening of the mitral valve leaflet(s). Trivial mitral valve regurgitation. MV peak gradient, 4.2 mmHg. The mean mitral valve gradient is 2.0 mmHg. Tricuspid Valve: The tricuspid valve is grossly normal. Tricuspid valve regurgitation is trivial. Aortic Valve: The aortic valve is bicuspid. There is moderate calcification of the aortic valve. There is moderate thickening of the aortic valve. Aortic valve regurgitation is not visualized. Moderate to severe aortic stenosis is present. Aortic valve mean gradient measures 44.0 mmHg. Aortic valve peak gradient measures 70.9 mmHg. Aortic valve area, by VTI measures 0.90 cm. Pulmonic Valve: The pulmonic valve was grossly normal. Pulmonic valve regurgitation is trivial. Aorta: Aortic dilatation noted. There is mild dilatation of the ascending aorta, measuring 39 mm. Venous: The inferior vena cava is normal in size with greater than 50% respiratory variability, suggesting right atrial pressure of 3 mmHg. IAS/Shunts: No atrial level shunt detected by color flow Doppler.  LEFT VENTRICLE PLAX 2D LVIDd:         4.00 cm     Diastology LVIDs:         2.70 cm     LV e' medial:    6.09 cm/s LV PW:         1.50 cm     LV E/e' medial:  15.5 LV IVS:        0.80 cm     LV e' lateral:   7.29 cm/s LVOT diam:     2.20 cm     LV E/e' lateral: 13.0 LV SV:         101 LV SV Index:   51 LVOT Area:     3.80 cm  LV Volumes (MOD) LV vol d, MOD A2C: 59.8 ml LV vol d, MOD A4C: 54.3 ml LV vol s, MOD A2C: 22.3 ml LV vol s, MOD A4C: 22.6 ml LV SV MOD A2C:     37.5 ml LV SV MOD A4C:     54.3 ml LV SV MOD BP:      34.9 ml RIGHT VENTRICLE RV S prime:     11.20 cm/s TAPSE (M-mode): 2.4 cm LEFT ATRIUM             Index       RIGHT ATRIUM           Index LA diam:        3.90 cm 1.97 cm/m  RA Area:     15.80 cm LA Vol (A2C):   70.6 ml 35.71 ml/m RA Volume:   38.50 ml  19.48 ml/m LA Vol (A4C):   60.4 ml 30.55 ml/m LA Biplane Vol: 66.9 ml 33.84 ml/m  AORTIC VALVE                    PULMONIC VALVE AV  Area (Vmax):    0.96 cm     PV Vmax:       0.79 m/s AV Area (Vmean):   0.96 cm     PV Peak grad:  2.5 mmHg AV Area (VTI):     0.90 cm AV Vmax:           421.00 cm/s AV Vmean:          313.000 cm/s AV VTI:            1.130 m AV  Peak Grad:      70.9 mmHg AV Mean Grad:      44.0 mmHg LVOT Vmax:         106.00 cm/s LVOT Vmean:        78.800 cm/s LVOT VTI:          0.266 m LVOT/AV VTI ratio: 0.24  AORTA Ao Root diam: 3.00 cm Ao Asc diam:  3.90 cm MITRAL VALVE                 TRICUSPID VALVE MV Area (PHT): 3.08 cm      TR Peak grad:   27.0 mmHg MV Area VTI:   2.69 cm      TR Vmax:        260.00 cm/s MV Peak grad:  4.2 mmHg MV Mean grad:  2.0 mmHg      SHUNTS MV Vmax:       1.03 m/s      Systemic VTI:  0.27 m MV Vmean:      59.2 cm/s     Systemic Diam: 2.20 cm MV Decel Time: 246 msec MR Peak grad:    51.3 mmHg MR Mean grad:    38.0 mmHg MR Vmax:         358.00 cm/s MR Vmean:        295.0 cm/s MR PISA:         0.25 cm MR PISA Eff ROA: 2 mm MR PISA Radius:  0.20 cm MV E velocity: 94.60 cm/s MV A velocity: 89.20 cm/s MV E/A ratio:  1.06 Lyman Bishop MD Electronically signed by Lyman Bishop MD Signature Date/Time: 11/08/2020/11:30:52 AM    Final    ECHOCARDIOGRAM COMPLETE  Result Date: 11/06/2020    ECHOCARDIOGRAM REPORT   Patient Name:   BRITTNEY MUCHA Date of Exam: 11/06/2020 Medical Rec #:  [id number]    Height:       69.5 in Accession #:    [id number]   Weight:       175.0 lb Date of Birth:  July 08, 1961    BSA:          1.962 m Patient Age:    41 years     BP:           120/80 mmHg Patient Gender: F            HR:           77 bpm. Exam Location:  Inpatient Procedure: 2D Echo Indications:    R07.9* Chest pain, unspecified  History:        Patient has prior history of Echocardiogram examinations. Aortic                 Valve Disease.  Sonographer:    Dustin Flock Referring Phys: [place]  1. Left ventricular ejection fraction, by estimation, is 60 to 65%. The left ventricle has normal  function. The left ventricle has no regional wall motion abnormalities. The left ventricular internal cavity size was mildly dilated. Left ventricular diastolic parameters are consistent with Grade I diastolic dysfunction (impaired relaxation).  2. Right ventricular systolic function is normal. The right ventricular size is normal.  3. Left atrial size was mildly dilated.  4. The mitral valve is normal in structure. Trivial mitral valve regurgitation. No evidence of mitral stenosis.  5. Not well visualized but appears / known to be bicusid mean gradient stable 28.5 mmHg was 30 mmHg May 2021 However AVA/DVI more consistent now with  moderate and not severe AS AVA 1.22 cm2 and DVI 0.31 . The aortic valve is bicuspid. There is moderate calcification of the aortic valve. There is moderate thickening of the aortic valve. Aortic valve regurgitation is not visualized. Moderate to severe aortic valve stenosis.  6. May 2021 previous aortic root measured 3.9 only 3.5 on current study No evidence of aortic dissection, Sinus of valsalva aneurysm or other root pathology No AR and no pericardial effusion.  7. The inferior vena cava is normal in size with greater than 50% respiratory variability, suggesting right atrial pressure of 3 mmHg. FINDINGS  Left Ventricle: Left ventricular ejection fraction, by estimation, is 60 to 65%. The left ventricle has normal function. The left ventricle has no regional wall motion abnormalities. The left ventricular internal cavity size was mildly dilated. There is  no left ventricular hypertrophy. Left ventricular diastolic parameters are consistent with Grade I diastolic dysfunction (impaired relaxation). Right Ventricle: The right ventricular size is normal. No increase in right ventricular wall thickness. Right ventricular systolic function is normal. Left Atrium: Left atrial size was mildly dilated. Right Atrium: Right atrial size was normal in size. Pericardium: There is no evidence of  pericardial effusion. Mitral Valve: The mitral valve is normal in structure. There is mild thickening of the mitral valve leaflet(s). Trivial mitral valve regurgitation. No evidence of mitral valve stenosis. Tricuspid Valve: The tricuspid valve is normal in structure. Tricuspid valve regurgitation is not demonstrated. No evidence of tricuspid stenosis. Aortic Valve: Not well visualized but appears / known to be bicusid mean gradient stable 28.5 mmHg was 30 mmHg May 2021 However AVA/DVI more consistent now with moderate and not severe AS AVA 1.22 cm2 and DVI 0.31. The aortic valve is bicuspid. There is moderate calcification of the aortic valve. There is moderate thickening of the aortic valve. There is moderate aortic valve annular calcification. Aortic valve regurgitation is not visualized. Moderate to severe aortic stenosis is present. Aortic valve mean gradient measures 28.5 mmHg. Aortic valve peak gradient measures 49.4 mmHg. Aortic valve area, by VTI measures 1.28 cm. Pulmonic Valve: The pulmonic valve was normal in structure. Pulmonic valve regurgitation is not visualized. No evidence of pulmonic stenosis. Aorta: May 2021 previous aortic root measured 3.9 only 3.5 on current study No evidence of aortic dissection, Sinus of valsalva aneurysm or other root pathology No AR and no pericardial effusion. The aortic root is normal in size and structure. Venous: The inferior vena cava is normal in size with greater than 50% respiratory variability, suggesting right atrial pressure of 3 mmHg. IAS/Shunts: No atrial level shunt detected by color flow Doppler.  LEFT VENTRICLE PLAX 2D LVIDd:         4.70 cm  Diastology LVIDs:         2.30 cm  LV e' medial:    5.77 cm/s LV PW:         1.20 cm  LV E/e' medial:  11.7 LV IVS:        1.10 cm  LV e' lateral:   7.62 cm/s LVOT diam:     2.30 cm  LV E/e' lateral: 8.8 LV SV:         85 LV SV Index:   43 LVOT Area:     4.15 cm  RIGHT VENTRICLE RV Basal diam:  3.50 cm RV S prime:      10.80 cm/s TAPSE (M-mode): 2.2 cm LEFT ATRIUM             Index  RIGHT ATRIUM           Index LA diam:        3.80 cm 1.94 cm/m  RA Area:     15.40 cm LA Vol (A2C):   27.4 ml 13.97 ml/m RA Volume:   37.20 ml  18.96 ml/m LA Vol (A4C):   27.5 ml 14.02 ml/m LA Biplane Vol: 28.2 ml 14.38 ml/m  AORTIC VALVE AV Area (Vmax):    1.22 cm AV Area (Vmean):   1.17 cm AV Area (VTI):     1.28 cm AV Vmax:           351.33 cm/s AV Vmean:          244.000 cm/s AV VTI:            0.667 m AV Peak Grad:      49.4 mmHg AV Mean Grad:      28.5 mmHg LVOT Vmax:         103.00 cm/s LVOT Vmean:        68.700 cm/s LVOT VTI:          0.205 m LVOT/AV VTI ratio: 0.31  AORTA Ao Root diam: 2.90 cm MITRAL VALVE               TRICUSPID VALVE MV Area (PHT): 3.03 cm    TR Peak grad:   21.5 mmHg MV Decel Time: 250 msec    TR Vmax:        232.00 cm/s MV E velocity: 67.30 cm/s MV A velocity: 91.70 cm/s  SHUNTS MV E/A ratio:  0.73        Systemic VTI:  0.20 m                            Systemic Diam: 2.30 cm Jenkins Rouge MD Electronically signed by Jenkins Rouge MD Signature Date/Time: 11/06/2020/3:53:07 PM    Final    CT Angio Chest/Abd/Pel for Dissection W and/or W/WO  Result Date: 11/06/2020 CLINICAL DATA:  60 year old presenting with chest pain radiating into back. Current history of aortic stenosis due to congenital bicuspid valve. EXAM: CT ANGIOGRAPHY CHEST, ABDOMEN AND PELVIS (DISSECTION PROTOCOL) TECHNIQUE: Non-contrast CT of the chest was initially obtained. Multidetector CT imaging through the chest, abdomen and pelvis was performed using the standard protocol during bolus administration of intravenous contrast. Multiplanar reconstructed images and MIPs were obtained and reviewed to evaluate the vascular anatomy. CONTRAST:  142mL OMNIPAQUE IOHEXOL 350 MG/ML IV. COMPARISON:  CTA chest for pulmonary embolism earlier same day. FINDINGS: CTA CHEST FINDINGS Cardiovascular: No visible calcified plaque on the unenhanced images. Contrast  opacification of the systemic arteries is excellent. No evidence of thoracic aortic dissection. 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 Kyla Balzarine note from earlier today). No visible soft plaque within the thoracic aorta. Heart size upper normal to slightly enlarged with evidence of LEFT ventricular hypertrophy. Marked thickening of the aortic valve with associated dense calcification. While not done for coronary artery evaluation, there is calcified and noncalcified plaque in the LAD. No visible coronary atherosclerosis elsewhere. Mediastinum/Nodes: No pathologically enlarged mediastinal, hilar or axillary lymph nodes. No mediastinal masses. Normal-appearing esophagus. Visualized thyroid gland normal in appearance. Lungs/Pleura: As mentioned on the CT a earlier today, there is a 4 mm perifissural nodule adjacent to the RIGHT major fissure in the RIGHT MIDDLE LOBE. Lung parenchyma otherwise clear apart from dependent  atelectasis posteriorly in the lower lobes. Central airways patent without significant bronchial wall thickening. No pleural effusions. Musculoskeletal: Regional skeleton unremarkable without acute or significant osseous abnormality. Review of the MIP images confirms the above findings. CTA ABDOMEN AND PELVIS FINDINGS VASCULAR Aorta: Mild atherosclerosis with calcified and noncalcified plaque distally. No evidence of aneurysm or dissection. 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 SMA: Widely patent without atherosclerosis. Renals: Single renal arteries bilaterally, widely patent without atherosclerosis. IMA: Widely patent without atherosclerosis. Inflow: Widely patent iliofemoral arteries bilaterally. Mild atherosclerosis involving the distal LEFT common iliac artery. No visible atherosclerosis elsewhere. Veins: Not evaluated. Review of the MIP  images confirms the above findings. NON-VASCULAR Hepatobiliary: Liver normal in size and appearance. Surgically absent gallbladder. No unexpected biliary ductal dilation. Pancreas: Normal in appearance without evidence of mass, ductal dilation, or inflammation. Spleen: Normal in size and appearance. Adrenals/Urinary Tract: Normal appearing adrenal glands. Calculi in both kidneys, including 2 calculi in adjacent upper pole calices of the LEFT kidney on the order 2-3 mm, a small 1 2 mm calculus in a mid calyx of the LEFT kidney, a 5 mm calculus in a pole calyx of the LEFT kidney and a small 1-2 mm calculus in a LOWER pole calyx of the RIGHT kidney. No ureteral calculi on either side. No evidence of hydronephrosis. No focal parenchymal abnormality involving either kidney. Normal appearing urinary bladder. Stomach/Bowel: Stomach normal in appearance for the degree of distention. Mild dilation of a few loops of jejunum in the upper abdomen, with associated mild wall thickening, though there is no edema/inflammation in the surrounding mesenteric fat. Remaining small bowel normal in appearance. Moderate stool burden throughout the normal appearing colon. Normal appendix in the RIGHT upper pelvis. Lymphatic: No pathologic lymphadenopathy. Reproductive: Surgically absent uterus.  No adnexal masses. Other: None. Musculoskeletal: Regional skeleton unremarkable without acute or significant osseous abnormality. Review of the MIP images confirms the above findings. IMPRESSION: 1. No evidence of thoracic or abdominal aortic dissection. 2. 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 Kyla Balzarine note from earlier today). 3. Solitary calcified and noncalcified plaque in the LAD. 4. 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 5. Bilateral non-obstructing renal calculi.  6. Mild dilation of a few loops of jejunum involving the upper abdomen, with associated mild wall thickening, though there is no edema/inflammation in the surrounding mesenteric fat. This may be due to a localized ileus or enteritis. 7. Aortic Atherosclerosis (ICD10-I70.0), mild. I telephoned these results at the time of interpretation to Dr. Johnsie Cancel of cardiology 11/06/2020 at 6:54 p.m. Electronically Signed   By: Evangeline Dakin M.D.   On: 11/06/2020 19:10   Disposition   Pt is being discharged home today in improved condition.  Follow-up Plans & Appointments     Discharge Instructions    Diet - low sodium heart healthy   Complete by: As directed    Increase activity slowly   Complete by: As directed       Discharge Medications   Allergies as of 11/09/2020      Reactions   Sulfa Antibiotics Rash      Medication List    TAKE these medications   acetaminophen 500 MG tablet Commonly known as: TYLENOL Take 500 mg by mouth every 6 (six) hours as needed for mild pain, moderate pain or headache.   escitalopram  10 MG tablet Commonly known as: LEXAPRO Take 5 mg by mouth at bedtime.   estradiol 2 MG tablet Commonly known as: ESTRACE Take 2 mg by mouth at bedtime.   guaiFENesin 600 MG 12 hr tablet Commonly known as: MUCINEX Take 600 mg by mouth 2 (two) times daily as needed for cough or to loosen phlegm.   multivitamin with minerals tablet Take 1 tablet by mouth at bedtime.          Outstanding Labs/Studies     Duration of Discharge Encounter   Greater than 30 minutes including physician time.  Signed, Rosaria Ferries, PA-C 11/09/2020, 4:21 PM

## 2020-11-09 NOTE — Plan of Care (Signed)

## 2020-11-09 NOTE — Telephone Encounter (Signed)
Patient admitted today- attempt [facility] call tomorrow 04/13

## 2020-11-10 ENCOUNTER — Encounter: Payer: Self-pay | Admitting: Cardiology

## 2020-11-10 ENCOUNTER — Other Ambulatory Visit: Payer: Self-pay | Admitting: Cardiology

## 2020-11-10 MED ORDER — COLCHICINE 0.6 MG PO TABS: 0.6000 mg | ORAL_TABLET | Freq: Two times a day (BID) | ORAL | 2 refills | Status: DC

## 2020-11-10 NOTE — Telephone Encounter (Signed)
Patient contacted regarding discharge from [facility] on 11/09/20.  Patient understands to follow up with provider C. Drucilla Schmidt on 12/03/20 at 11:15 at [facility]. Patient understands discharge instructions? yes  Patient understands medications and regiment? yes  Patient understands to bring all medications to this visit? yes

## 2020-11-26 DIAGNOSIS — L578 Other skin changes due to chronic exposure to nonionizing radiation: Secondary | ICD-10-CM | POA: Diagnosis not present

## 2020-11-26 DIAGNOSIS — L821 Other seborrheic keratosis: Secondary | ICD-10-CM | POA: Diagnosis not present

## 2020-11-26 DIAGNOSIS — L219 Seborrheic dermatitis, unspecified: Secondary | ICD-10-CM | POA: Diagnosis not present

## 2020-11-26 DIAGNOSIS — D225 Melanocytic nevi of trunk: Secondary | ICD-10-CM | POA: Diagnosis not present

## 2020-11-27 NOTE — Progress Notes (Signed)
Cardiology Office Note:    Date:  12/03/2020   ID:  Olivia Mcgrath, DOB [birth year], MRN [id number]  PCP:  Jene Every, MD  Cardiologist:  Glenetta Hew, MD  Electrophysiologist:  None   Referring MD: Jene Every, MD   Chief Complaint: hospital follow-up for myocarditis  History of Present Illness:    Olivia Mcgrath is a 60 y.o. female with a history of normal coronaries on recent cardiac cath on 11/08/2020, recent diagonsis of myocarditis, bicuspid aortic valve with moderate aortic stenosis, mild dilatation of ascending thoracic aorta, and hyperlipidemia who is followed by Dr. Ellyn Hack and presents today for hospital follow-up of myocarditis.   Patient has been followed by Dr. Ellyn Hack since 2018 for bicuspid valve with aortic stenosis. She was recently admitted from 11/06/2020 to 11/09/2020 for chest pain and elevated troponin. High-sensitivity troponin peaked in the 500s. CTA showed negative for PE but did note a 3.9cm ascending aortic aneurysm (no evidence of thoracic or abdominal aortic dissection though). Echo on 4/9 showed LVEF of 60-65% with normal wall motion, grade 1 diastolic dysfunction, and moderate to severe aortic stenosis (felt to be more consistent with moderate with mean gradient of 28.5 mmHG, peak gradient of 49.4 mmHg, AVA 1.22 cm^2, and DVI of 0.31). Repeat Echo on 4/11 again showed moderate to severe aortic stenosis with mean gradient of 44.0 mmHg and peak gradient of 70.9 mmHg. Patient underwent R/LHC on 11/08/2020 showed normal coronaries with moderate aortic stenosis (mean gradient 30 mmHg and AVA of 1.16cm^2 with index of 0.58) and normal left sided and right sided filling pressures. Cardiac MRI was then ordered and was consistent with acute myocarditis with elevated native T1, T2, and ECV in the basal inferior wall as well as midwall LGE in the apical inferolateral wall. She was started on Colchicine with plans to continue this for 3 months.   Patient presents today for  follow-up. Patient doing well since discharge. No recurrent chest pain. She reports very mild dyspnea with certain activities such as going up and down the stairs but this is not new since discharge. No shortness of breath with routine activities. No orthopnea or PND. She has some chronic swelling a right leg from prior injury but no new/significant lower extremities. She notes occasional brief palpitations that sound like premature beats but no sustained palpitations. No lightheadedness, dizziness, near syncope/syncope.   Past Medical History:  Diagnosis Date  . Calcific aortic stenosis of bicuspid valve    2-D echo 02/16/2017: Difficult to see if bicuspid (congenital versus functional) or tricuspid.  thickened, calcified Peak and mean gradients - 45 and 25 mm Hg respectively c/w MODERATE  AS.   Marland Kitchen Myocarditis ([place]) 11/09/2020    Past Surgical History:  Procedure Laterality Date  . ABDOMINAL HYSTERECTOMY  11/2008  . LAPAROSCOPIC CHOLECYSTECTOMY  05/2016  . RIGHT/LEFT HEART CATH AND CORONARY ANGIOGRAPHY N/A 11/08/2020   Procedure: RIGHT/LEFT HEART CATH AND CORONARY ANGIOGRAPHY;  Surgeon: Martinique, Peter M, MD;  Location: [facility];  Service: Cardiovascular;  Laterality: N/A;  . TRANSTHORACIC ECHOCARDIOGRAM  03/2016   Bicuspid aortic valve with no regurgitation. Mild thickening with moderate calcification. Mild to moderate stenosis (mean gradient 28 mmHg, peak gradient 40.4 mmHg). Mild concentric LVH with EF 55-60%. Normal diastolic filling  . TRANSTHORACIC ECHOCARDIOGRAM  06/2019    Left ventricle: The cavity size was  normal. Wall thickness was normal. Systolic function was normal. EF 55% to 60%. Normal Diastolic parameters. - Aortic valve: AV is diffiicult to see well It  is thickened, calcified Peak and mean gradients through the valve are 45 and 25 mm Hg respectively consistent with moderate AS. Mean gradient (S): 26 mm Hg. Peak gradient (S): 43 mm Hg. Mild MR  . TRANSTHORACIC  ECHOCARDIOGRAM  11/2019   EF 60 to 65%.  GR 1 DD.  No R WMA.  Mild LA dilation.  Stable moderate-severe AS (mean gradient roughly 30 mmHg with peak 53 mmHg) stable.  Okay to follow-up in 1 year.    Current Medications: Current Meds  Medication Sig  . acetaminophen (TYLENOL) 500 MG tablet Take 500 mg by mouth every 6 (six) hours as needed for mild pain, moderate pain or headache.  . colchicine 0.6 MG tablet Take 1 tablet (0.6 mg total) by mouth 2 (two) times daily.  Marland Kitchen escitalopram (LEXAPRO) 10 MG tablet Take 5 mg by mouth at bedtime.  Marland Kitchen estradiol (ESTRACE) 2 MG tablet Take 2 mg by mouth at bedtime.  . Fluocinolone Acetonide 0.01 % OIL Place in ear(s) daily.  Marland Kitchen guaiFENesin (MUCINEX) 600 MG 12 hr tablet Take 600 mg by mouth as needed for cough or to loosen phlegm.  . Multiple Vitamins-Minerals (MULTIVITAMIN WITH MINERALS) tablet Take 1 tablet by mouth at bedtime.     Allergies:   Sulfa antibiotics   Social History   Socioeconomic History  . Marital status: Married    Spouse name: Not on file  . Number of children: 2  . Years of education: 21  . Highest education level: Not on file  Occupational History  . 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 Smoking status: Never Smoker  . Smokeless tobacco: Never Used  Substance and Sexual Activity  . Alcohol use: Yes    Alcohol/week: 1.0 standard drink    Types: 1 Glasses of wine per week  . Drug use: No  . Sexual activity: Yes  Other Topics Concern  . Not on file  Social History Narrative   Lives with husband  2 children.   Daughter is Therapist, sports on 2[employer] @ [employer].   Limited exercise - occasional walks up to 2 miles - tries to walk~3-4/wek.   Social Determinants of Health   Financial Resource Strain: Not on file  Food Insecurity: Not on file  Transportation Needs: Not on file  Physical Activity: Not on file  Stress: Not on file  Social Connections: Not on file     Family History: The patient's  family history includes Atrial fibrillation in her maternal grandmother; Cancer in her maternal grandmother, paternal grandfather, and paternal grandmother; Heart disease in her paternal grandmother; Hyperlipidemia in her father and mother; Hypertension in her father and mother; Lung cancer in her father; Non-Hodgkin's lymphoma in her mother; Rheum arthritis in her brother and mother.  ROS:   Please see the history of present illness.     EKGs/Labs/Other Studies Reviewed:    The following studies were reviewed today:  Echocardiogram 11/06/2020: Impressions: 1. Left ventricular ejection fraction, by estimation, is 60 to 65%. The  left ventricle has normal function. The left ventricle has no regional  wall motion abnormalities. The left ventricular internal cavity size was  mildly dilated. Left ventricular  diastolic parameters are consistent with Grade I diastolic dysfunction  (impaired relaxation).  2. Right ventricular systolic function is normal. The right ventricular  size is normal.  3. Left atrial size was mildly dilated.  4. The mitral valve is normal in structure. Trivial mitral valve  regurgitation. No evidence of mitral stenosis.  5. Not well visualized but appears / known to be bicusid mean gradient  stable 28.5 mmHg was 30 mmHg May 2021 However AVA/DVI more consistent now  with moderate and not severe AS AVA 1.22 cm2 and DVI 0.31 . The aortic  valve is bicuspid. There is moderate  calcification of the aortic valve. There is moderate thickening of the  aortic valve. Aortic valve regurgitation is not visualized. Moderate to  severe aortic valve stenosis.  6. May 2021 previous aortic root measured 3.9 only 3.5 on current study  No evidence of aortic dissection, Sinus of valsalva aneurysm or other root  pathology No AR and no pericardial effusion.  7. The inferior vena cava is normal in size with greater than 50%  respiratory variability, suggesting right atrial pressure  of 3 mmHg. _______________  Echocardiogram 11/08/2020: Impressions: 1. Left ventricular ejection fraction, by estimation, is 60 to 65%. The  left ventricle has normal function. The left ventricle has no regional  wall motion abnormalities. There is mild left ventricular hypertrophy.  Left ventricular diastolic parameters  are consistent with Grade I diastolic dysfunction (impaired relaxation).  2. Right ventricular systolic function is normal. The right ventricular  size is normal. There is normal pulmonary artery systolic pressure. The  estimated right ventricular systolic pressure is [id number] mmHg.  3. Left atrial size was mildly dilated.  4. The mitral valve is abnormal. Trivial mitral valve regurgitation.  5. The aortic valve is bicuspid and moderately calcified. There appear to  be fusion of the non and left coronary cusps. Aortic valve regurgitation  is not visualized. Moderate to severe aortic valve stenosis. Aortic valve  area, by VTI measures 0.90 cm.  Aortic valve mean gradient measures 44.0 mmHg. Aortic valve Vmax measures  4.21 m/s. DI is 0.24.  6. Aortic dilatation noted. There is mild dilatation of the ascending  aorta, measuring 39 mm.  7. The inferior vena cava is normal in size with greater than 50%  respiratory variability, suggesting right atrial pressure of 3 mmHg.   Comparison(s): Changes from prior study are noted. 11/06/2020: LVEF 60-65%,  AOV mean gradient 28.5 mmHg, AVA 1.22 cm2, DI 0.31. _______________  Right/Left Cardiac Catheterization 11/08/2020:   The left ventricular systolic function is normal.  LV end diastolic pressure is normal.  The left ventricular ejection fraction is 55-65% by visual estimate.  There is moderate aortic valve stenosis.   1. Normal coronary anatomy 2. Moderate Aortic stenosis. Mean gradient 30 mm Hg. AVA 1.16 cm squared with index 0.58 3. Normal LV filling pressures 4. Normal right heart pressures. 5. Normal cardiac  output.  Plan: no clear cause for chest pain identified. Medical management.  Diagnostic Dominance: Right   _______________  Cardiac MRI 11/09/2020: Impressions: 1. Findings consistent with acute myocarditis, with elevated native T1, T2, and ECV in basal inferior wall. Also with midwall LGE in apical inferolateral wall, which can be seen in myocarditis 2.  Normal LV size and systolic function (EF [id number]) 3.  Normal RV size and systolic function (EF [id number])  EKG:  EKG ordered today. EKG personally reviewed and demonstrates normal sinus rhythm, rate 60 bpm, with incomplete RBBB and Q waves in inferior leads but no acute ST/T changes from prior tracings. Left axis deviation. QTc 422 ms.  Recent Labs: 11/06/2020: ALT 20; TSH 1.561 11/09/2020: BUN 14; Creatinine, Ser 0.64; Hemoglobin 13.9; Platelets 218; Potassium 4.1; Sodium 138  Recent Lipid Panel    Component Value Date/Time  CHOL 223 (H) 11/06/2020 1557   CHOL 210 (H) 02/27/2020 0939   TRIG 93 11/06/2020 1557   HDL 58 11/06/2020 1557   HDL 64 02/27/2020 0939   CHOLHDL 3.8 11/06/2020 1557   VLDL 19 11/06/2020 1557   LDLCALC 146 (H) 11/06/2020 1557   LDLCALC 126 (H) 02/27/2020 0939    Physical Exam:    Vital Signs: BP 122/70   Pulse 60   Ht 5\' 9"  (1.753 m)   Wt 180 lb (81.6 kg)   SpO2 98%   BMI 26.58 kg/m     Wt Readings from Last 3 Encounters:  12/03/20 180 lb (81.6 kg)  11/09/20 178 lb 6.4 oz (80.9 kg)  02/27/20 202 lb (91.6 kg)     General: 60 y.o. female in no acute distress. HEENT: Normocephalic and atraumatic. Sclera clear. EOMs intact. Neck: Supple. Bilateral carotid bruits. No JVD. Heart: RRR. Distinct S1 and S2. III/VI systolic murmur consistent with aortic stenosis. No gallops or rubs. Radial pulses 2+ and equal bilaterally. Right radial cath site soft with no signs of hematoma. Lungs: No increased work of breathing. Clear to ausculation bilaterally. No wheezes, rhonchi, or rales.  Abdomen: Soft,  non-distended, and non-tender to palpation.  Extremities: Mild right lower extremity edema (due to prior injury).  Skin: Warm and dry. Neuro: Alert and oriented x3. No focal deficits. Psych: Normal affect. Responds appropriately.  Assessment:    1. Myocarditis, unspecified chronicity, unspecified myocarditis type ([place])   2. Aortic stenosis due to bicuspid aortic valve   3. Bilateral carotid bruits   4. Ascending aorta dilatation ([facility])   5. Hyperlipidemia, unspecified hyperlipidemia type     Plan:    Myocarditis - Recently admitted with chest pain and elevated troponin and found to have myocarditis on cardiac MRI. LHC showed clean coronaries. - No recurrent chest pain.  - Continue Colchicine 0.6mg  twice daily. Plan is to continue this for 3 months.  - No exercise for 3-6 months.  - No NSAIDs.  Bicuspid Aorta with Aortic Stenosis - Echo during recent admission showed LVEF of 60-65% with normal wall motion, grade 1 diastolic dysfunction, and moderate to severe AS (felt to be more consistent with moderate with mean gradient of 28.5 mmHG, peak gradient of 49.4 mmHg, AVA 1.22 cm^2, and DVI of 0.31). R/LCH also consistent with moderate AS. - Will continue routine monitoring. Recommend repeat Echo in 1 year (sooner if she becomes more symptomatic).  Carotid Bruits - Bilateral carotid bruits noted on exam.  - Suspect this is actually radiation from aortic stenosis. However, patient has never had carotids evaluated so will order carotid dopplers.  Mild Dilatation of Ascending Thoracic Aorta - CTA during recent admission showed 3.9 cm ascending thoracic aorta (corresponding with size noted on Echo). - Can continue to monitor on routine Echos for aortic stenosis.  Hyperlipidemia - Lipid panel during recent admission in 10/2020: Total Cholesterol 223, Triglycerides 93, HDL 58, LDL 146.  - ASCVD 10 year risk <5% so no statin recommended at this time.  - Can focus on lifestyle medications for  now.  Disposition: Follow up in 6 months with Dr. Ellyn Hack.   Medication Adjustments/Labs and Tests Ordered: Current medicines are reviewed at length with the patient today.  Concerns regarding medicines are outlined above.  Orders Placed This Encounter  Procedures  . EKG 12-Lead  . VAS US CAROTID   No orders of the defined types were placed in this encounter.   Patient Instructions  Medication Instructions:  Your  physician recommends that you continue on your current medications as directed. Please refer to the Current Medication list given to you today.  CONTINUE: Colchicine for 3 months  *If you need a refill on your cardiac medications before your next appointment, please call your pharmacy*   Lab Work: None If you have labs (blood work) drawn today and your tests are completely normal, you will receive your results only by: Marland Kitchen MyChart Message (if you have MyChart) OR . A paper copy in the mail If you have any lab test that is abnormal or we need to change your treatment, we will call you to review the results.   Testing/Procedures: Your physician has requested that you have a carotid duplex. This test is an ultrasound of the carotid arteries in your neck. It looks at blood flow through these arteries that supply the brain with blood. Allow one hour for this exam. There are no restrictions or special instructions.  Follow-Up: At [facility], you and your health needs are our priority.  As part of our continuing mission to provide you with exceptional heart care, we have created designated Provider Care Teams.  These Care Teams include your primary Cardiologist (physician) and Advanced Practice Providers (APPs -  Physician Assistants and Nurse Practitioners) who all work together to provide you with the care you need, when you need it.  Your next appointment:   6 month(s)  The format for your next appointment:   In Person  Provider:   You may see Glenetta Hew, MD or  one of the following Advanced Practice Providers on your designated Care Team:    Rosaria Ferries, PA-C  Jory Sims, DNP, ANP   NO NSAIDS OR EXCERCISE FOR 6 MONTHS       Signed, Eppie Gibson  12/03/2020 12:38 PM    [place]

## 2020-12-03 ENCOUNTER — Other Ambulatory Visit: Payer: Self-pay

## 2020-12-03 ENCOUNTER — Ambulatory Visit: Payer: BC Managed Care – PPO | Admitting: Student

## 2020-12-03 ENCOUNTER — Encounter: Payer: Self-pay | Admitting: Student

## 2020-12-03 VITALS — BP 122/70 | HR 60 | Ht 69.0 in | Wt 180.0 lb

## 2020-12-03 DIAGNOSIS — Q230 Congenital stenosis of aortic valve: Secondary | ICD-10-CM

## 2020-12-03 DIAGNOSIS — I77810 Thoracic aortic ectasia: Secondary | ICD-10-CM | POA: Diagnosis not present

## 2020-12-03 DIAGNOSIS — I350 Nonrheumatic aortic (valve) stenosis: Secondary | ICD-10-CM

## 2020-12-03 DIAGNOSIS — Q231 Congenital insufficiency of aortic valve: Secondary | ICD-10-CM

## 2020-12-03 DIAGNOSIS — I514 Myocarditis, unspecified: Secondary | ICD-10-CM | POA: Diagnosis not present

## 2020-12-03 DIAGNOSIS — R0989 Other specified symptoms and signs involving the circulatory and respiratory systems: Secondary | ICD-10-CM

## 2020-12-03 DIAGNOSIS — E785 Hyperlipidemia, unspecified: Secondary | ICD-10-CM

## 2020-12-03 NOTE — Patient Instructions (Signed)
Medication Instructions:  Your physician recommends that you continue on your current medications as directed. Please refer to the Current Medication list given to you today.  CONTINUE: Colchicine for 3 months  *If you need a refill on your cardiac medications before your next appointment, please call your pharmacy*   Lab Work: None If you have labs (blood work) drawn today and your tests are completely normal, you will receive your results only by: Marland Kitchen MyChart Message (if you have MyChart) OR . A paper copy in the mail If you have any lab test that is abnormal or we need to change your treatment, we will call you to review the results.   Testing/Procedures: Your physician has requested that you have a carotid duplex. This test is an ultrasound of the carotid arteries in your neck. It looks at blood flow through these arteries that supply the brain with blood. Allow one hour for this exam. There are no restrictions or special instructions.  Follow-Up: At [facility], you and your health needs are our priority.  As part of our continuing mission to provide you with exceptional heart care, we have created designated Provider Care Teams.  These Care Teams include your primary Cardiologist (physician) and Advanced Practice Providers (APPs -  Physician Assistants and Nurse Practitioners) who all work together to provide you with the care you need, when you need it.  Your next appointment:   6 month(s)  The format for your next appointment:   In Person  Provider:   You may see Glenetta Hew, MD or one of the following Advanced Practice Providers on your designated Care Team:    Rosaria Ferries, PA-C  Jory Sims, DNP, ANP   NO NSAIDS OR EXCERCISE FOR 6 MONTHS

## 2021-01-14 ENCOUNTER — Ambulatory Visit (HOSPITAL_COMMUNITY)
Admission: RE | Admit: 2021-01-14 | Discharge: 2021-01-14 | Disposition: A | Discharge status: Home / Self Care | Payer: BC Managed Care – PPO | Source: Ambulatory Visit | Attending: Internal Medicine | Admitting: Internal Medicine

## 2021-01-14 ENCOUNTER — Other Ambulatory Visit: Payer: Self-pay

## 2021-01-14 DIAGNOSIS — R0989 Other specified symptoms and signs involving the circulatory and respiratory systems: Secondary | ICD-10-CM

## 2021-03-09 NOTE — Telephone Encounter (Signed)
I don't think it sounds like a flare up of myocarditis so I don't think she needs to continue Colchicine. However, if she has chest pain or worsening shortness of breath she should let us know. Agree with following up with PCP for concerns about depression.  Thanks so much!

## 2021-03-13 ENCOUNTER — Emergency Department (HOSPITAL_COMMUNITY)
Admission: EM | Admit: 2021-03-13 | Discharge: 2021-03-14 | Disposition: A | Discharge status: Home / Self Care | Payer: BC Managed Care – PPO | Attending: Emergency Medicine | Admitting: Emergency Medicine

## 2021-03-13 ENCOUNTER — Emergency Department (HOSPITAL_BASED_OUTPATIENT_CLINIC_OR_DEPARTMENT_OTHER): Payer: BC Managed Care – PPO

## 2021-03-13 ENCOUNTER — Emergency Department (HOSPITAL_COMMUNITY): Payer: BC Managed Care – PPO

## 2021-03-13 ENCOUNTER — Other Ambulatory Visit: Payer: Self-pay

## 2021-03-13 ENCOUNTER — Encounter (HOSPITAL_COMMUNITY): Payer: Self-pay | Admitting: Emergency Medicine

## 2021-03-13 DIAGNOSIS — M25562 Pain in left knee: Secondary | ICD-10-CM

## 2021-03-13 DIAGNOSIS — R0602 Shortness of breath: Secondary | ICD-10-CM | POA: Diagnosis not present

## 2021-03-13 DIAGNOSIS — Z5321 Procedure and treatment not carried out due to patient leaving prior to being seen by health care provider: Secondary | ICD-10-CM | POA: Insufficient documentation

## 2021-03-13 DIAGNOSIS — M7989 Other specified soft tissue disorders: Secondary | ICD-10-CM | POA: Insufficient documentation

## 2021-03-13 DIAGNOSIS — J9811 Atelectasis: Secondary | ICD-10-CM | POA: Diagnosis not present

## 2021-03-13 LAB — CBC WITH DIFFERENTIAL/PLATELET
Abs Immature Granulocytes: 0.02 10*3/uL (ref 0.00–0.07)
Basophils Absolute: 0 10*3/uL (ref 0.0–0.1)
Basophils Relative: 0 %
Eosinophils Absolute: 0.1 10*3/uL (ref 0.0–0.5)
Eosinophils Relative: 1 %
HCT: 40.2 % (ref 36.0–46.0)
Hemoglobin: 13.8 g/dL (ref 12.0–15.0)
Immature Granulocytes: 0 %
Lymphocytes Relative: 40 %
Lymphs Abs: 2.8 10*3/uL (ref 0.7–4.0)
MCH: 31.7 pg (ref 26.0–34.0)
MCHC: 34.3 g/dL (ref 30.0–36.0)
MCV: 92.4 fL (ref 80.0–100.0)
Monocytes Absolute: 0.4 10*3/uL (ref 0.1–1.0)
Monocytes Relative: 6 %
Neutro Abs: 3.7 10*3/uL (ref 1.7–7.7)
Neutrophils Relative %: 53 %
Platelets: 261 10*3/uL (ref 150–400)
RBC: 4.35 MIL/uL (ref 3.87–5.11)
RDW: 11.6 % (ref 11.5–15.5)
WBC: 7 10*3/uL (ref 4.0–10.5)
nRBC: 0 % (ref 0.0–0.2)

## 2021-03-13 LAB — BASIC METABOLIC PANEL
Anion gap: 8 (ref 5–15)
BUN: 15 mg/dL (ref 6–20)
CO2: 27 mmol/L (ref 22–32)
Calcium: 9.4 mg/dL (ref 8.9–10.3)
Chloride: 103 mmol/L (ref 98–111)
Creatinine, Ser: 0.78 mg/dL (ref 0.44–1.00)
GFR, Estimated: >60 mL/min (ref >60)
Glucose, Bld: 93 mg/dL (ref 70–99)
Potassium: 3.6 mmol/L (ref 3.5–5.1)
Sodium: 138 mmol/L (ref 135–145)

## 2021-03-13 LAB — TROPONIN I (HIGH SENSITIVITY): Troponin I (High Sensitivity): 30 ng/L — ABNORMAL HIGH (ref <18)

## 2021-03-13 NOTE — ED Notes (Signed)
Patient states she has to go go to work in the morning and cant stay. Advised patient to stay. Patient declined but states she will follow up with primary care

## 2021-03-13 NOTE — ED Provider Notes (Signed)
Emergency Medicine Provider Triage Evaluation Note  Olivia Mcgrath , a 60 y.o. female  was evaluated in triage.  Pt complains of intermittent shortness of breath x1 week and left leg pain and swelling x1 day.  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  Her cardiologist is Dr. Johny Chess.  She messaged the office about her symptoms and they recommended ER evaluation she reports.  Patient states she feels like she just cannot catch her breath.  She denies any leg injury.  No recent travel.  Review of Systems  Positive: Shortness of breath, leg pain, leg swelling Negative: Jaw pain, back pain, chest pain, cough, diaphoresis  Physical Exam  BP (!) 154/101 (BP Location: Right Arm)   Pulse 64   Temp 98 F (36.7 C)   Resp 16   SpO2 99%  Gen:   Awake, no distress   Resp:  Normal effort  MSK:   Moves extremities without difficulty  Other:  1+ edema to left lower extremity.  Equal tactile temperature in all extremities.  Negative Homans' sign bilaterally.  Medical Decision Making  Medically screening exam initiated at 6:46 PM.  Appropriate orders placed.  Terrye Pelon was informed that the remainder of the evaluation will be completed by another provider, this initial triage assessment does not replace that evaluation, and the importance of remaining in the ED until their evaluation is complete.  Cardiac work-up initiated and ultrasound ordered of left lower extremity.   Portions of this note were generated with [employer]. Dictation errors may occur despite best attempts at proofreading.    Barrie Folk, PA-C 03/13/21 [place], [place] A, DO 03/13/21 2213

## 2021-03-13 NOTE — ED Notes (Signed)
NS notifying vascular US of pt.

## 2021-03-13 NOTE — ED Triage Notes (Signed)
C/o SOB x 1 week.  Reports L lower leg swelling, pain, and warmth x 1 day.

## 2021-03-13 NOTE — Progress Notes (Signed)
VASCULAR LAB    Left lower extremity venous duplex has been performed.  See CV proc for preliminary results.  Gave verbal report to Karolee Stamps, RN  Mauro Kaufmann, Chidiebere Wynn, RVT 03/13/2021, 7:25 PM

## 2021-03-13 NOTE — ED Notes (Signed)
Pt decided to stay.

## 2021-03-13 NOTE — ED Notes (Signed)
Patient states she has to work and thought she could wait. Patient states she will follow up with primary care

## 2021-03-14 NOTE — Telephone Encounter (Signed)
Spoke to patient advised Dr.Harding is out of office this week.Stated she was told before she left ED yesterday she did not have a blood clot in her left leg.Stated she has been having sob for the past 2 weeks.No chest pain.Appointment scheduled with DOD Dr.Hochrein 03/17/21 at 3:30 pm.

## 2021-03-16 DIAGNOSIS — I82409 Acute embolism and thrombosis of unspecified deep veins of unspecified lower extremity: Secondary | ICD-10-CM | POA: Insufficient documentation

## 2021-03-16 DIAGNOSIS — I514 Myocarditis, unspecified: Secondary | ICD-10-CM | POA: Insufficient documentation

## 2021-03-16 DIAGNOSIS — R0602 Shortness of breath: Secondary | ICD-10-CM | POA: Insufficient documentation

## 2021-03-16 NOTE — Progress Notes (Signed)
Cardiology Office Note   Date:  03/17/2021   ID:  Olivia Mcgrath, DOB [birth year], MRN [id number]  PCP:  Jene Every, MD  Cardiologist:   Glenetta Hew, MD   Chief Complaint  Patient presents with   Shortness of Breath      History of Present Illness: Olivia Mcgrath is a 60 y.o. female who presents with a history of normal coronaries on recent cardiac cath on 11/08/2020, recent diagonsis of myocarditis, bicuspid aortic valve with moderate aortic stenosis, mild dilatation of ascending thoracic aorta, and hyperlipidemia who is followed by Dr. Ellyn Hack   She was added to my schedule because she was in the ED with SOB and a sore leg.  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   She said that this started with just some soreness.  She felt it was slightly red and swollen.   She was not noted to have any deep vein thrombosis.   She has had some shortness of breath.  She said that she had this back when she had COVID.  However, it got better.  However now for the last couple of weeks she has had some more shortness of breath.  This was prior to her legs starting to hurt her.  She is had no cough fevers or chills.  She does find that she breathes better lying on her stomach.  She has not had any new lower extremity swelling, PND or orthopnea.  She has not had any chest pressure like she had previously been myocarditis.  She is not describing PND or orthopnea.  I do note that her troponin was 30 in the emergency room but this was much lower than it was in April when she was last in the hospital.   Past Medical History:  Diagnosis Date   Calcific aortic stenosis of bicuspid valve    2-D echo 02/16/2017: Difficult to see if bicuspid (congenital versus functional) or tricuspid.  thickened, calcified Peak and mean gradients - 45 and 25 mm Hg respectively c/w MODERATE  AS.    Myocarditis ([place]) 11/09/2020    Past Surgical History:  Procedure Laterality Date   ABDOMINAL  HYSTERECTOMY  11/2008   LAPAROSCOPIC CHOLECYSTECTOMY  05/2016   RIGHT/LEFT HEART CATH AND CORONARY ANGIOGRAPHY N/A 11/08/2020   Procedure: RIGHT/LEFT HEART CATH AND CORONARY ANGIOGRAPHY;  Surgeon: Martinique, Peter M, MD;  Location: [facility];  Service: Cardiovascular;  Laterality: N/A;   TRANSTHORACIC ECHOCARDIOGRAM  03/2016   Bicuspid aortic valve with no regurgitation. Mild thickening with moderate calcification. Mild to moderate stenosis (mean gradient 28 mmHg, peak gradient 40.4 mmHg). Mild concentric LVH with EF 55-60%. Normal diastolic filling   TRANSTHORACIC ECHOCARDIOGRAM  06/2019    Left ventricle: The cavity size was  normal. Wall thickness was normal. Systolic function was normal. EF 55% to 60%. Normal Diastolic parameters. - Aortic valve: AV is diffiicult to see well It is thickened, calcified Peak and mean gradients through the valve are 45 and 25 mm Hg respectively consistent with moderate AS. Mean gradient (S): 26 mm Hg. Peak gradient (S): 43 mm Hg. Mild MR   TRANSTHORACIC ECHOCARDIOGRAM  11/2019   EF 60 to 65%.  GR 1 DD.  No R WMA.  Mild LA dilation.  Stable moderate-severe AS (mean gradient roughly 30 mmHg with peak 53 mmHg) stable.  Okay to follow-up in 1 year.     Current Outpatient Medications  Medication Sig Dispense Refill  acetaminophen (TYLENOL) 500 MG tablet Take 500 mg by mouth every 6 (six) hours as needed for mild pain, moderate pain or headache.     colchicine 0.6 MG tablet Take 1 tablet (0.6 mg total) by mouth 2 (two) times daily. 60 tablet 2   escitalopram (LEXAPRO) 10 MG tablet Take 5 mg by mouth at bedtime.     estradiol (ESTRACE) 2 MG tablet Take 2 mg by mouth at bedtime.     Fluocinolone Acetonide 0.01 % OIL Place in ear(s) daily.     guaiFENesin (MUCINEX) 600 MG 12 hr tablet Take 600 mg by mouth as needed for cough or to loosen phlegm.     Multiple Vitamins-Minerals (MULTIVITAMIN WITH MINERALS) tablet Take 1 tablet by mouth at bedtime.     No current  facility-administered medications for this visit.    Allergies:   Sulfa antibiotics    ROS:  Please see the history of present illness.   Otherwise, review of systems are positive for none.   All other systems are reviewed and negative.    PHYSICAL EXAM: VS:  BP 127/74   Pulse 67   Ht '5\' 9"'$  (1.753 m)   Wt 184 lb 9.6 oz (83.7 kg)   SpO2 98%   BMI 27.26 kg/m  , BMI Body mass index is 27.26 kg/m. GENERAL:  Well appearing NECK:  No jugular venous distention, waveform within normal limits, carotid upstroke brisk and symmetric, no bruits, no thyromegaly LUNGS:  Clear to auscultation bilaterally CHEST:  Unremarkable HEART:  PMI not displaced or sustained,S1 and S2 within normal limits, no S3, no S4, no clicks, no rubs, 3 out of 6 apical systolic murmur, no diastolic murmurs ABD:  Flat, positive bowel sounds normal in frequency in pitch, no bruits, no rebound, no guarding, no midline pulsatile mass, no hepatomegaly, no splenomegaly EXT:  2 plus pulses throughout, no edema, no cyanosis no clubbing, varicose vein with some tenderness in the left lateral popliteal region  EKG:  EKG is not ordered today. The ekg ordered 03/13/2021 demonstrates sinus rhythm, rate 64, leftward axis, RSR prime V1, no acute ST-T wave changes.   Recent Labs: 11/06/2020: ALT 20; TSH 1.561 03/13/2021: BUN 15; Creatinine, Ser 0.78; Hemoglobin 13.8; Platelets 261; Potassium 3.6; Sodium 138    Lipid Panel    Component Value Date/Time   CHOL 223 (H) 11/06/2020 1557   CHOL 210 (H) 02/27/2020 0939   TRIG 93 11/06/2020 1557   HDL 58 11/06/2020 1557   HDL 64 02/27/2020 0939   CHOLHDL 3.8 11/06/2020 1557   VLDL 19 11/06/2020 1557   LDLCALC 146 (H) 11/06/2020 1557   LDLCALC 126 (H) 02/27/2020 0939      Wt Readings from Last 3 Encounters:  03/17/21 184 lb 9.6 oz (83.7 kg)  12/03/20 180 lb (81.6 kg)  11/09/20 178 lb 6.4 oz (80.9 kg)      Other studies Reviewed: Additional studies/ records that were reviewed  today include: ED records. Review of the above records demonstrates:  Please see elsewhere in the note.     ASSESSMENT AND PLAN:  LEG PAIN: She has a thrombosis of a varicose vein and is having warm compresses and pain management of this.  There was no evidence of DVT.  SOB: I will check a BNP level.  I will check a troponin to make sure its not going up from 30.  If these are elevated she probably would need sooner study with an echocardiogram.  I would have a low  threshold for repeat MRI.  MYOCARDITIS: This will be evaluated as above.  AS: I do note that the gradient seem to increase from last year to the echocardiogram on 4 9.  I will tentatively schedule her for an echo in October which will be 6 months from the previous.  Current medicines are reviewed at length with the patient today.  The patient does not have concerns regarding medicines.  The following changes have been made:  no change  Labs/ tests ordered today include:   Orders Placed This Encounter  Procedures   Pro b natriuretic peptide (BNP)[id number])   Troponin T   ECHOCARDIOGRAM COMPLETE     Disposition:   FU with Dr. Ellyn Hack next available   Signed, Minus Breeding, MD  03/17/2021 5:18 PM    [facility]

## 2021-03-17 ENCOUNTER — Ambulatory Visit (INDEPENDENT_AMBULATORY_CARE_PROVIDER_SITE_OTHER): Payer: BC Managed Care – PPO | Admitting: Cardiology

## 2021-03-17 ENCOUNTER — Other Ambulatory Visit: Payer: Self-pay

## 2021-03-17 ENCOUNTER — Ambulatory Visit: Payer: BC Managed Care – PPO | Admitting: Cardiology

## 2021-03-17 ENCOUNTER — Encounter: Payer: Self-pay | Admitting: Cardiology

## 2021-03-17 VITALS — BP 127/74 | HR 67 | Ht 69.0 in | Wt 184.6 lb

## 2021-03-17 DIAGNOSIS — R0602 Shortness of breath: Secondary | ICD-10-CM | POA: Diagnosis not present

## 2021-03-17 DIAGNOSIS — I514 Myocarditis, unspecified: Secondary | ICD-10-CM | POA: Diagnosis not present

## 2021-03-17 DIAGNOSIS — I82409 Acute embolism and thrombosis of unspecified deep veins of unspecified lower extremity: Secondary | ICD-10-CM

## 2021-03-17 DIAGNOSIS — Q231 Congenital insufficiency of aortic valve: Secondary | ICD-10-CM

## 2021-03-17 DIAGNOSIS — Q230 Congenital stenosis of aortic valve: Secondary | ICD-10-CM

## 2021-03-17 NOTE — Patient Instructions (Signed)
  Lab Work:  Your physician recommends that you HAVE LAB WORK TODAY  If you have labs (blood work) drawn today and your tests are completely normal, you will receive your results only by: MyChart Message (if you have MyChart) OR A paper copy in the mail If you have any lab test that is abnormal or we need to change your treatment, we will call you to review the results.   Testing/Procedures:  Your physician has requested that you have an echocardiogram. Echocardiography is a painless test that uses sound waves to create images of your heart. It provides your doctor with information about the size and shape of your heart and how well your heart's chambers and valves are working. This procedure takes approximately one hour. There are no restrictions for this procedure. [place]   Follow-Up: At [facility], you and your health needs are our priority.  As part of our continuing mission to provide you with exceptional heart care, we have created designated Provider Care Teams.  These Care Teams include your primary Cardiologist (physician) and Advanced Practice Providers (APPs -  Physician Assistants and Nurse Practitioners) who all work together to provide you with the care you need, when you need it.  We recommend signing up for the patient portal called "MyChart".  Sign up information is provided on this After Visit Summary.  MyChart is used to connect with patients for Virtual Visits (Telemedicine).  Patients are able to view lab/test results, encounter notes, upcoming appointments, etc.  Non-urgent messages can be sent to your provider as well.   To learn more about what you can do with MyChart, go to [email or web address].    Your next appointment:    AFTER ECHO COMPLETE WITH DR Ellyn Hack

## 2021-03-18 LAB — TROPONIN T: Troponin T (Highly Sensitive): 21 ng/L (ref 0–14)

## 2021-03-18 LAB — PRO B NATRIURETIC PEPTIDE: NT-Pro BNP: 164 pg/mL (ref 0–287)

## 2021-03-29 ENCOUNTER — Other Ambulatory Visit: Payer: Self-pay

## 2021-03-29 MED ORDER — COLCHICINE 0.6 MG PO TABS: 0.6000 mg | ORAL_TABLET | Freq: Two times a day (BID) | ORAL | 10 refills | Status: DC

## 2021-05-13 ENCOUNTER — Ambulatory Visit (HOSPITAL_COMMUNITY): Payer: BC Managed Care – PPO | Attending: Internal Medicine

## 2021-05-13 ENCOUNTER — Other Ambulatory Visit: Payer: Self-pay

## 2021-05-13 DIAGNOSIS — Q231 Congenital insufficiency of aortic valve: Secondary | ICD-10-CM

## 2021-05-13 DIAGNOSIS — Q230 Congenital stenosis of aortic valve: Secondary | ICD-10-CM | POA: Insufficient documentation

## 2021-05-13 HISTORY — PX: TRANSTHORACIC ECHOCARDIOGRAM: SHX275

## 2021-05-13 LAB — ECHOCARDIOGRAM COMPLETE
AR max vel: 0.87 cm2
AV Area VTI: 0.87 cm2
AV Area mean vel: 0.81 cm2
AV Mean grad: 34 mmHg
AV Peak grad: 54.5 mmHg
Ao pk vel: 3.69 m/s
Area-P 1/2: 3.21 cm2
S' Lateral: 2.25 cm

## 2021-05-15 ENCOUNTER — Encounter: Payer: Self-pay | Admitting: Cardiology

## 2021-05-15 NOTE — Progress Notes (Signed)
Primary Care Provider: Jene Every, MD Cardiologist: Glenetta Hew, MD Electrophysiologist: None  Clinic Note: Chief Complaint  Patient presents with   Follow-up   Aortic Stenosis    Follow-up repeat echo results.   Myocarditis    Post COVID myocarditis.  Confirmed by MRI.  Completed colchicine.    ===================================  ASSESSMENT/PLAN   Problem List Items Addressed This Visit       Cardiology Problems   Aortic stenosis due to bicuspid aortic valve - Primary (Chronic)    Reviewing her echo results from April, 1 echo suggested moderate to severe stenosis, however the second suggested severe AS.  Thankfully, her follow-up echocardiogram now in October showed mean gradient 34 mm which still puts her in the moderate to severe category.  Aortic valve does appear to be somewhat narrowed.  Follow symptoms closely.  We will see her back in 6 months.  For now exertional dyspnea is difficult to assess because she is recovering from prolonged illness.  Low threshold to consider referral for AVR evaluation.  Discussed concerning signs of the symptomatic AS: Anginal type chest pain, exertional dyspnea, CHF symptoms of PND and orthopnea, & Syncope/near syncope.  Plan: 2D echo in April 2023      Relevant Orders   ECHOCARDIOGRAM COMPLETE   Dyslipidemia, goal LDL below 100 (Chronic)    LDL 146. Need to reassess next year.  If still elevated, would probably want to start treating an LDL goal closer to 100 given her aortic valve stenosis.  She is just now recovering from a prolonged illness, all Br[place]ham adding to medication until follow-up.  Consider rosuvastatin 20 mg.      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  No longer having chest pain or dyspnea.  Has completed colchicine.  Follow-up echocardiogram shows preserved EF.      Relevant Orders   ECHOCARDIOGRAM COMPLETE   Acute deep vein thrombosis (DVT) of lower extremity (HCC)    Not DVT.   Superficial thrombus of the varicose vein.  Treated with warm compress and elevation.  Symptom-free now. Need to monitor for worsening edema and consider potential recommendations continues support stockings.        Other   SOB (shortness of breath)    Still somewhat short of breath from her COVID.  Gradually building up her strength.  Thankfully, echo does not show severe AS.  Would like to reassess in 6 months.      Relevant Orders   ECHOCARDIOGRAM COMPLETE    ===================================  HPI:    Olivia Mcgrath is a 60 y.o. female with a PMH notable for MODERATE AS with BICUSPID AORTIC VALVE, and CHRONIC MYOCARDITIS with recent diagnosis of DVT who presents today for 2 month f/u & to discuss Echo results..  I last saw Olivia Mcgrath on February 27, 2020 to follow-up her screening echo revealing Moderate-Severe AS with mean gradient of 30 mmHg a peak of 53 mHg -> she was overall pretty asymptomatic from a cardiac standpoint.  Not yet back into shape because she is recovering from her ankle fracture.  Anxiety improved on Lexapro.  Less of the flushing/tachycardia spells. -> Plan was follow-up echocardiogram in a year.  We discussed features concerning for Symptomatic Aortic Stenosis.  Shortly after she was seen in the office, she contracted COVID-19, and was admitted to [facility].  Apparently COVID symptoms began on 03/03/20, she tested positive on 8/7. Rx with [place] O2 x 24 hr. Remdesivir x 2 & Dexamathasone  to complete as outpatient therapy). --> 2nd Bout of COVID was in Jan 2022.  Recent Hospitalizations:  4/9-06/2021: p/w 2-3 d of "not feeling well". Pre-syncope while @ work she had some left arm tingling and pain was substernal chest pain (on the GI), but not relieved by Tums.  Episodes lasted up to 20 minutes.  EKG was nonspecific, but troponin was elevated at 359-> 424-536.  No evidence of PE on CT scan.  Felt to be possible coronary dissection versus viral myocarditis.-RL HC performed  along with echo.   Echo showed stable valve. => CMR ordered confirming viral myocarditis. ->  Started on colchicine for planned 36-month course. 8/14-15/2022, ER: Shortness of breath x1 week with left leg pain x1 day.  Evaluated for DVT.  Only thrombosed varicose vein.  Troponin negative.  Olivia Mcgrath has been seen on several occasions since discharge: 12/03/2020 - Olivia Rives, PA -> patient was doing well since discharge.  No recurrent chest pain.  Very mild dyspnea with certain activities versus going up and down stairs.  Stable.  Chronic right leg swelling from prior injury but no new findings.  Occasional brief palpitations. ->  Carotid Dopplers ordered to exclude carotid disease with radiated aortic murmur. Seen by Dr. Percival Spanish as DOD work in [place] follow-up on 03/17/2021: Confirmed that lower extremity venous Doppler only showed thrombosed varicose vein in the left leg.  No DVT.  Had some shortness of breath, but seem to have been improved until just prior to ER visit.  Noted some worsening shortness of breath -> prior to leg hurting.  Denied having the chest pressure like that associated with myocarditis. Recommended warm compresses for varicose vein management. BNP and troponin ordered. ->  NT Pro BNP was 164. 68-month follow-up echo ordered for October  Reviewed  CV studies:    The following studies were reviewed today: (if available, images/films reviewed: From Epic Chart or Care Everywhere) TTE 11/06/2020: EF 60 to 65%.  Mild dilation.  No R WMA.  Mild LA dilation.  Normal RV.  AoV not well-visualized.  Moderate-Severe AS (mean-peak gradients 28.5 mmHg - 49.4 mmHg). Normal CVP. Normal MV.  CT Aortic Angiogram chest/abdomen/pelvis 11/06/2020: Mild aortic atherosclerosis with calcified and noncalcified plaque.  No evidence of dissection or aneurysm.  A sending thoracic aorta measured 3.9 cm in diameter.  Solitary calcified and noncalcified plaque in the LAD.  TTE 11/08/2020: Normal EF 55 to  60%.  Mild LVH.  GR 1 DD.  Mild LA dilation.  Bicuspid AoV (mod Ca++).  Fusion of non & Left coronary cusps.  ~ Severe AS: AVA estimated 0.9 cm.  Mean gradient 44 mmHg., peak ~71 mmHg.   R&LHC 11/08/20: Normal Coronary Arteries. Moderate AS (mean gradient 30 mmHg, AVA 1.16 cm). Normal RH Pressures & CO/CI: RAP ~2 mmHg, RVP-EDP 24/0 mmHg-3 mmHg, PAP-mean 21/6 mmHg- 12 mmHg.  PCWP 7 mmhg. LVP-EDP 122/32 mmHg-7 mmHg.  CO-CI 6.1-3.07.   Cardiac MRI 11/09/2020: Findings c/w ACUTE MYOCARDITIS - involving basal inferior wall and mid wall along the apical inferolateral wall.  LV function estimated 63%, RV function 57%.  Cardiac output-index 4.8L/min-2.4L/min/m  Carotid Doppler 01/14/2021: No evidence of thrombus, dissection, atherosclerotic plaque or stenosis in the cervical carotid system.  Bilateral vertebral arteries demonstrate antegrade flow.  Normal bilateral subclavian flow dynamics.  03/13/2021: Lower extremity DVT Doppler: Thrombosed varicosity noted in the left lateral knee and distal thigh.  No right-sided common femoral DVT, no left-sided DVT or superficial vein thrombosis.  TTE 05/13/2021: Moderate AoV  calcification - Mod-Severe AS (Mean Gradient 34 mmHg, peak 54.5 mmHg, AVA 0.7 cm).  LVEF remains 60 to 65%.  No R WMA.  GR 1 DD.  Normal PAP.  Moderate LA dilation.   Interval History:   Artavia Stueve presents for 22-month follow-up doing okay.  She completed her 11-month course of colchicine.  She has not had any further episodes of chest pain or significant exertional dyspnea.  She sometimes has to stop to catch her breath when she is active but also sometimes has to yawn to take a deep breath in.  Unfortunately, dating back to before her April hospitalization, she was not very active and has never become more more out of shape.  She never gets some exertional dyspnea from that but has not had any further untoward symptoms.  Still probably too early to assess symptoms with her aortic valve  disease.  Interestingly, the echocardiogram showed worse gradients when she was ill that have improved on most recent echo.  Now still in the moderate to severe range.  CV Review of Symptoms (Summary) Cardiovascular ROS: positive for - dyspnea on exertion, shortness of breath, and gradually improving since post-COVID.  Has intermittently had worsening dyspnea, but now stable.  Breathes better with lying on her stomach.  Less prominent leg swelling in the cord like clot is no longer left lower leg. negative for - edema, irregular heartbeat, orthopnea, palpitations, paroxysmal nocturnal dyspnea, rapid heart rate, or syncope/near syncope or TIA/amaurosis fugax, claudication  REVIEWED OF SYSTEMS   Review of Systems  Constitutional:  Positive for malaise/fatigue (Still somewhat deconditioned and fatigued from her COVID infection.). Negative for weight loss.  HENT:  Negative for congestion and nosebleeds.   Respiratory:  Positive for shortness of breath (Notably better.  Just has brief spots where she has the on to take a deep breath in.). Negative for cough and wheezing.        Still like to sleep on her stomach, easier to breathe.  Cardiovascular:  Negative for claudication and leg swelling.  Gastrointestinal:  Negative for blood in stool and melena.  Genitourinary:  Negative for dysuria and hematuria.  Musculoskeletal:  Negative for joint pain.  Neurological:  Negative for dizziness, focal weakness and headaches.  Psychiatric/Behavioral:  Negative for memory loss. The patient is not nervous/anxious and does not have insomnia.    I have reviewed and (if needed) personally updated the patient's problem list, medications, allergies, past medical and surgical history, social and family history.   PAST MEDICAL HISTORY   Past Medical History:  Diagnosis Date   Calcific aortic stenosis of bicuspid valve    Most Recent Echo April * Oct 2022: Mod Severe AS - mean gradient between 30 & 44 mmHg (3 echos  & 1 cath).   Myocarditis ([place]) 11/09/2020   Cardiac MRI 11/09/2020: Findings consistent with acute myocarditis involving the basal inferior wall and mid wall along the apical inferolateral wall.    PAST SURGICAL HISTORY   Past Surgical History:  Procedure Laterality Date   ABDOMINAL HYSTERECTOMY  11/2008   Cardiac MRI  11/09/2020   Findings c/w ACUTE MYOCARDITIS - involving basal inferior wall and mid wall along the apical inferolateral wall.  LV function estimated 63%, RV function 57%.  Cardiac output-index 4.8L/min-2.4L/min/m   LAPAROSCOPIC CHOLECYSTECTOMY  05/2016   RIGHT/LEFT HEART CATH AND CORONARY ANGIOGRAPHY N/A 11/08/2020   Procedure: RIGHT/LEFT HEART CATH AND CORONARY ANGIOGRAPHY;  Surgeon: Martinique, Peter M, MD;  Location: [facility];  Service:  Cardiovascular;; NORMAL CORONARY ARTERIES. MOD AS (mean AoV Gradient 30 mmHg, AVA 1.16 cm). RAP ~2 mmHg, RVP-EDP 24/0 mmHg-3 mmHg, PAP-mean 21/6 mmHg- 12 mmHg.  PCWP 7 mmhg. LVP-EDP 122/32 mmHg-7 mmHg.  CO-CI 6.1-3.07.   TRANSTHORACIC ECHOCARDIOGRAM  03/2016   A) Bicuspid AoV - No veg. Mild thickening & Mod Calcification. Mild-Mod AS (mean grad 28 mmHg, peak grad 40.4 mmHg). Mild conc LVH. EF 55-60%. Normal DFxn;; B) 06/2019:; EF 55% to 60%. Nl Diastolic Fxn. AoV Dfficult to visualize -> Peak / Mean grad 43 & 26 mm Hg = MOD AS);; C 11/2019: Stable Mod-Severe AS (mean - peak grad 30-53 mmHg). EF 60-65%, no RWMA. Mild LAD dilation.   TRANSTHORACIC ECHOCARDIOGRAM  10/2020   A) 4/9: EF 60-65%.  Mild LA dil.  No R WMA.  Normal RV.  AoV not well-visualized.  Mod-Severe AS (mean-peak gradients 28.5 mmHg - 49.4 mmHg). Normal CVP. Normal MV. ;; B)  11/08/2020: Normal EF 55 to 60%.  Mild LVH.  GR 1 DD.  Mild LA dilation.  Bicuspid AoV (mod Ca++).  Fusion of non & Left coronary cusps.  ~ Severe AS: AVA estimated 0.9 cm.  Mean gradient 44 mmHg., peak ~71 mmHg.   TRANSTHORACIC ECHOCARDIOGRAM  05/13/2021   Moderate AoV calcification - Mod-Severe AS (Mean  Gradient 34 mmHg, peak 54.5 mmHg, AVA 0.7 cm).  LVEF remains 60 to 65%.  No R WMA.  GR 1 DD.  Normal PAP.  Moderate LA dilation.     There is no immunization history on file for this patient.  MEDICATIONS/ALLERGIES   Current Meds  Medication Sig   acetaminophen (TYLENOL) 500 MG tablet Take 500 mg by mouth every 6 (six) hours as needed for mild pain, moderate pain or headache.   escitalopram (LEXAPRO) 10 MG tablet Take 5 mg by mouth at bedtime.   estradiol (ESTRACE) 2 MG tablet Take 2 mg by mouth at bedtime.   Fluocinolone Acetonide 0.01 % OIL Place in ear(s) daily.   guaiFENesin (MUCINEX) 600 MG 12 hr tablet Take 600 mg by mouth as needed for cough or to loosen phlegm.   Multiple Vitamins-Minerals (MULTIVITAMIN WITH MINERALS) tablet Take 1 tablet by mouth at bedtime.   valACYclovir (VALTREX) 1000 MG tablet Take 1,000 mg by mouth 2 (two) times daily.    Allergies  Allergen Reactions   Sulfa Antibiotics Rash    SOCIAL HISTORY/FAMILY HISTORY   Reviewed in Epic:  Pertinent findings:  Social History   Tobacco Use   Smoking status: Never   Smokeless tobacco: Never  Substance Use Topics   Alcohol use: Yes    Alcohol/week: 1.0 standard drink    Types: 1 Glasses of wine per week   Drug use: No   Social History   Social History Narrative   Lives with husband  2 children.   Daughter is Therapist, sports on 2S @ [employer].   Limited exercise - occasional walks up to 2 miles - tries to walk~3-4/wek.    OBJCTIVE -PE, EKG, labs   Wt Readings from Last 3 Encounters:  05/16/21 188 lb (85.3 kg)  03/17/21 184 lb 9.6 oz (83.7 kg)  12/03/20 180 lb (81.6 kg)    Physical Exam: BP 138/88 (BP Location: Right Arm, Patient Position: Sitting, Cuff Size: Normal)   Pulse 66   Ht 5\' 9"  (1.753 m)   Wt 188 lb (85.3 kg)   SpO2 100%   BMI 27.76 kg/m  Physical Exam Vitals reviewed.  Constitutional:  General: She is not in acute distress.    Appearance: Normal appearance. She is normal weight.  She is not ill-appearing or toxic-appearing.     Comments: Well-nourished, healthy-appearing  HENT:     Head: Normocephalic and atraumatic.  Neck:     Vascular: Decreased carotid pulses (Borderline delayed upstroke). No carotid bruit (Radiated aortic murmur.), hepatojugular reflux or JVD.  Cardiovascular:     Rate and Rhythm: Normal rate and regular rhythm. Occasional Extrasystoles are present.    Chest Wall: PMI is not displaced.     Pulses: Normal pulses.     Heart sounds: S1 normal and S2 normal. Murmur heard.  High-pitched harsh crescendo-decrescendo mid to late systolic murmur is present with a grade of 4/6 at the upper right sternal border radiating to the neck.    No friction rub. No gallop.  Pulmonary:     Effort: Pulmonary effort is normal. No respiratory distress.     Breath sounds: Normal breath sounds. No wheezing, rhonchi or rales.  Musculoskeletal:        General: No swelling. Normal range of motion.     Cervical back: Normal range of motion and neck supple.  Skin:    General: Skin is warm and dry.     Coloration: Skin is not pale.  Neurological:     General: No focal deficit present.     Mental Status: She is alert and oriented to person, place, and time.     Gait: Gait normal.  Psychiatric:        Mood and Affect: Mood normal.        Behavior: Behavior normal.        Thought Content: Thought content normal.        Judgment: Judgment normal.    Adult ECG Report Not checked  Recent Labs: Reviewed Lab Results  Component Value Date   CHOL 223 (H) 11/06/2020   HDL 58 11/06/2020   LDLCALC 146 (H) 11/06/2020   TRIG 93 11/06/2020   CHOLHDL 3.8 11/06/2020   Lab Results  Component Value Date   CREATININE 0.78 03/13/2021   BUN 15 03/13/2021   NA 138 03/13/2021   K 3.6 03/13/2021   CL 103 03/13/2021   CO2 27 03/13/2021   CBC Latest Ref Rng & Units 03/13/2021 11/09/2020 11/08/2020  WBC 4.0 - 10.5 K/uL 7.0 6.6 -  Hemoglobin 12.0 - 15.0 g/dL 13.8 13.9 12.9   Hematocrit 36.0 - 46.0 % 40.2 40.9 38.0  Platelets 150 - 400 K/uL 261 218 -    Lab Results  Component Value Date   HGBA1C 5.2 11/07/2020   Lab Results  Component Value Date   TSH 1.561 11/06/2020    ==================================================  COVID-19 Education: The signs and symptoms of COVID-19 were discussed with the patient and how to seek care for testing (follow up with PCP or arrange E-visit).    I spent a total of 16 minutes with the patient spent in direct patient consultation.  Additional time spent with chart review  / charting (studies, outside notes, etc): Pre- charting 30 min ; 18 minutes post visit Total Time: 64 min  Current medicines are reviewed at length with the patient today.  (+/- concerns) n/a  This visit occurred during the SARS-CoV-2 public health emergency.  Safety protocols were in place, including screening questions prior to the visit, additional usage of staff PPE, and extensive cleaning of exam room while observing appropriate contact time as indicated for disinfecting solutions.  Notice: This dictation  was prepared with Dragon dictation along with smart phrase technology. Any transcriptional errors that result from this process are unintentional and may not be corrected upon review.  Patient Instructions / Medication Changes & Studies & Tests Ordered   Patient Instructions  Medication Instructions:   No changes    Lab Work: Not needed   Testing/Procedures: Will be schedule for April 2023 at [place] has requested that you have an echocardiogram. Echocardiography is a painless test that uses sound waves to create images of your heart. It provides your doctor with information about the size and shape of your heart and how well your heart's chambers and valves are working. This procedure takes approximately one hour. There are no restrictions for this procedure.    Follow-Up: At [facility], you and your health needs are our priority.  As part of our continuing mission to provide you with exceptional heart care, we have created designated Provider Care Teams.  These Care Teams include your primary Cardiologist (physician) and Advanced Practice Providers (APPs -  Physician Assistants and Nurse Practitioners) who all work together to provide you with the care you need, when you need it.     Your next appointment:   6 to 7 month(s)  The format for your next appointment:   In Person  Provider:   Glenetta Hew, MD   Other Instructions    Studies Ordered:   Orders Placed This Encounter  Procedures   ECHOCARDIOGRAM COMPLETE      Glenetta Hew, M.D., M.S. Interventional Cardiologist   Pager # [phone] Phone # [phone] [address] [place], [place] [address]   Thank you for choosing Heartcare at [facility]!!

## 2021-05-16 ENCOUNTER — Ambulatory Visit (INDEPENDENT_AMBULATORY_CARE_PROVIDER_SITE_OTHER): Payer: BC Managed Care – PPO | Admitting: Cardiology

## 2021-05-16 ENCOUNTER — Encounter: Payer: Self-pay | Admitting: Cardiology

## 2021-05-16 ENCOUNTER — Other Ambulatory Visit: Payer: Self-pay

## 2021-05-16 VITALS — BP 138/88 | HR 66 | Ht 69.0 in | Wt 188.0 lb

## 2021-05-16 DIAGNOSIS — E785 Hyperlipidemia, unspecified: Secondary | ICD-10-CM | POA: Diagnosis not present

## 2021-05-16 DIAGNOSIS — I82409 Acute embolism and thrombosis of unspecified deep veins of unspecified lower extremity: Secondary | ICD-10-CM

## 2021-05-16 DIAGNOSIS — Q230 Congenital stenosis of aortic valve: Secondary | ICD-10-CM

## 2021-05-16 DIAGNOSIS — I514 Myocarditis, unspecified: Secondary | ICD-10-CM | POA: Diagnosis not present

## 2021-05-16 DIAGNOSIS — R0602 Shortness of breath: Secondary | ICD-10-CM

## 2021-05-16 DIAGNOSIS — Q231 Congenital insufficiency of aortic valve: Secondary | ICD-10-CM

## 2021-05-16 NOTE — Patient Instructions (Signed)
Medication Instructions:   No changes    Lab Work: Not needed   Testing/Procedures: Will be schedule for April 2023 at [place] has requested that you have an echocardiogram. Echocardiography is a painless test that uses sound waves to create images of your heart. It provides your doctor with information about the size and shape of your heart and how well your heart's chambers and valves are working. This procedure takes approximately one hour. There are no restrictions for this procedure.    Follow-Up: At [facility], you and your health needs are our priority.  As part of our continuing mission to provide you with exceptional heart care, we have created designated Provider Care Teams.  These Care Teams include your primary Cardiologist (physician) and Advanced Practice Providers (APPs -  Physician Assistants and Nurse Practitioners) who all work together to provide you with the care you need, when you need it.     Your next appointment:   6 to 7 month(s)  The format for your next appointment:   In Person  Provider:   Glenetta Hew, MD   Other Instructions

## 2021-05-16 NOTE — Progress Notes (Signed)
Dr Ellyn Hack discuss  echo report with patient at office appointment 10 /17/22

## 2021-06-01 ENCOUNTER — Encounter: Payer: Self-pay | Admitting: Cardiology

## 2021-06-01 NOTE — Assessment & Plan Note (Signed)
Still somewhat short of breath from her COVID.  Gradually building up her strength.  Thankfully, echo does not show severe AS.  Would like to reassess in 6 months.

## 2021-06-01 NOTE — Assessment & Plan Note (Addendum)
Reviewing her echo results from April, 1 echo suggested moderate to severe stenosis, however the second suggested severe AS.  Thankfully, her follow-up echocardiogram now in October showed mean gradient 34 mm which still puts her in the moderate to severe category.  Aortic valve does appear to be somewhat narrowed.  Follow symptoms closely.  We will see her back in 6 months.  For now exertional dyspnea is difficult to assess because she is recovering from prolonged illness.  Low threshold to consider referral for AVR evaluation.  Discussed concerning signs of the symptomatic AS: Anginal type chest pain, exertional dyspnea, CHF symptoms of PND and orthopnea, & Syncope/near syncope.  Plan: 2D echo in April 2023

## 2021-06-01 NOTE — Assessment & Plan Note (Signed)
Not DVT.  Superficial thrombus of the varicose vein.  Treated with warm compress and elevation.  Symptom-free now. Need to monitor for worsening edema and consider potential recommendations continues support stockings.

## 2021-06-01 NOTE — Assessment & Plan Note (Signed)
Symptoms seem to improved.  No longer having chest pain or dyspnea.  Has completed colchicine.  Follow-up echocardiogram shows preserved EF.

## 2021-06-01 NOTE — Assessment & Plan Note (Signed)
LDL 146. Need to reassess next year.  If still elevated, would probably want to start treating an LDL goal closer to 100 given her aortic valve stenosis.  She is just now recovering from a prolonged illness, all Br[place]ham adding to medication until follow-up.  Consider rosuvastatin 20 mg.

## 2021-06-17 ENCOUNTER — Ambulatory Visit: Payer: BC Managed Care – PPO | Admitting: Cardiology

## 2021-08-04 ENCOUNTER — Encounter: Payer: Self-pay | Admitting: Cardiology

## 2021-08-05 NOTE — Telephone Encounter (Signed)
It is really hard to tell at this point what is the cause of the symptoms.  We are to check a follow-up echocardiogram in April, if the shortness of breath is Becoming more concerning, we would want to then move that echocardiogram up to potentially February.  Not on any medications that I can tell will be causing this.  The pump function seem to be fine and stable some Exertional shortness of breath unless it is the valve.  Blood pressure reading was little bit high, but not overly worrisome.  Since symptoms do not seem to be that much interfering with life at this point I would say this monitor for the next week or 2, but if they are progressing, benefit we should go ahead move up the echocardiogram and have her seen sooner.  Glenetta Hew, MD

## 2021-08-08 ENCOUNTER — Ambulatory Visit: Payer: BC Managed Care – PPO | Admitting: Cardiology

## 2021-11-02 ENCOUNTER — Telehealth (HOSPITAL_COMMUNITY): Payer: Self-pay | Admitting: Radiology

## 2021-11-02 NOTE — Telephone Encounter (Signed)
Returned call from patient. Left message on voicemail. ?

## 2021-11-04 ENCOUNTER — Other Ambulatory Visit (HOSPITAL_COMMUNITY): Payer: BC Managed Care – PPO

## 2022-01-08 ENCOUNTER — Encounter: Payer: Self-pay | Admitting: Cardiology

## 2022-01-12 NOTE — Telephone Encounter (Signed)
Not sure what to say about the arm swelling issue.  I would expect legs to swell if it is related to the heart. As for the dizzy near passout spells, that is a more concerning issue and I do think we can potentially try to push up your echo.  Glenetta Hew, MD  Pls try to reschedule TTE for Aortic Stenosis - Structural Heart Team to Read.  Glenetta Hew, MD

## 2022-01-12 NOTE — Telephone Encounter (Signed)
Patient reports she feels like passing out at times. She will take deep breaths and drink a sports drink to feel better. I recommended that she be cognizant of sodium content in the sports drinks. I sent request to scheduling to see if echo can be sooner. She saw her new PCP today and had EKG and blood work. She will send [place] her lab results in [place] when she gets them.

## 2022-01-13 ENCOUNTER — Other Ambulatory Visit: Payer: Self-pay

## 2022-01-13 DIAGNOSIS — R42 Dizziness and giddiness: Secondary | ICD-10-CM

## 2022-01-13 DIAGNOSIS — Q230 Congenital stenosis of aortic valve: Secondary | ICD-10-CM

## 2022-02-02 ENCOUNTER — Ambulatory Visit (HOSPITAL_COMMUNITY): Payer: Commercial Managed Care - HMO | Attending: Cardiology

## 2022-02-02 DIAGNOSIS — R0602 Shortness of breath: Secondary | ICD-10-CM | POA: Diagnosis not present

## 2022-02-02 DIAGNOSIS — Q230 Congenital stenosis of aortic valve: Secondary | ICD-10-CM

## 2022-02-02 DIAGNOSIS — Q231 Congenital insufficiency of aortic valve: Secondary | ICD-10-CM | POA: Diagnosis not present

## 2022-02-02 DIAGNOSIS — I514 Myocarditis, unspecified: Secondary | ICD-10-CM

## 2022-02-02 LAB — ECHOCARDIOGRAM COMPLETE
AR max vel: 1.08 cm2
AV Area VTI: 1.01 cm2
AV Area mean vel: 0.99 cm2
AV Mean grad: 42 mmHg
AV Peak grad: 66.6 mmHg
Ao pk vel: 4.08 m/s
Area-P 1/2: 3.17 cm2
S' Lateral: 3 cm

## 2022-02-04 IMAGING — CT CT ANGIO CHEST-ABD-PELV FOR DISSECTION W/ AND WO/W CM
1 of 13 series · 10 of 38 positions shown, 16 images · IV contrast (Omni 300)
Comparison: CTA chest for pulmonary embolism earlier same day.

CLINICAL DATA: 59-year-old presenting with chest pain radiating
into back. Current history of aortic stenosis due to congenital
bicuspid valve.

EXAM:
CT ANGIOGRAPHY CHEST, ABDOMEN AND PELVIS (DISSECTION PROTOCOL)
TECHNIQUE: Non-contrast CT of the chest was initially obtained.

[Series 8: do not send · axial · 0.65mm/px · z∈[+912,+1312]mm · 10 of 490 slices shown, 16 images]
[im 45/490  mediastinal]
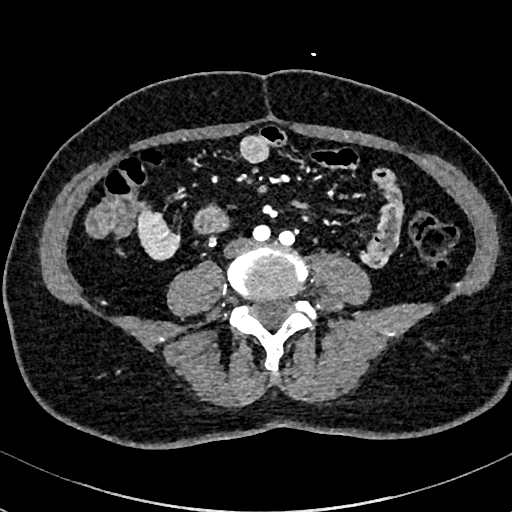
[im 45/490  bone]
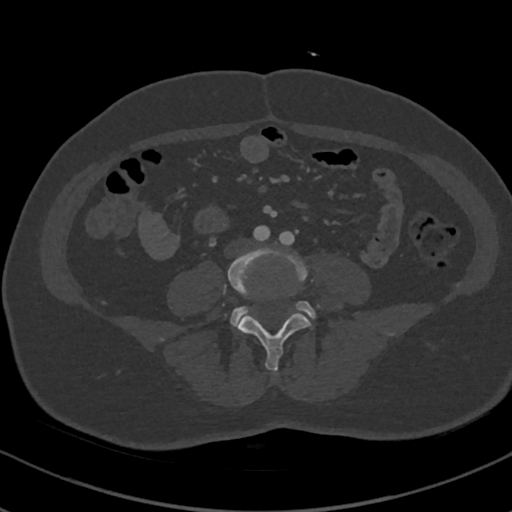
[im 89/490  mediastinal]
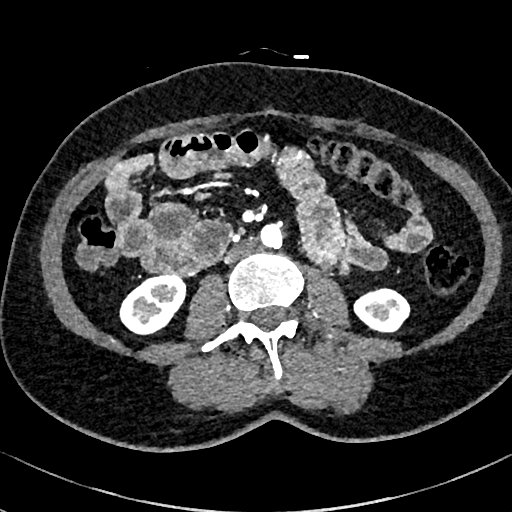
[im 134/490  mediastinal]
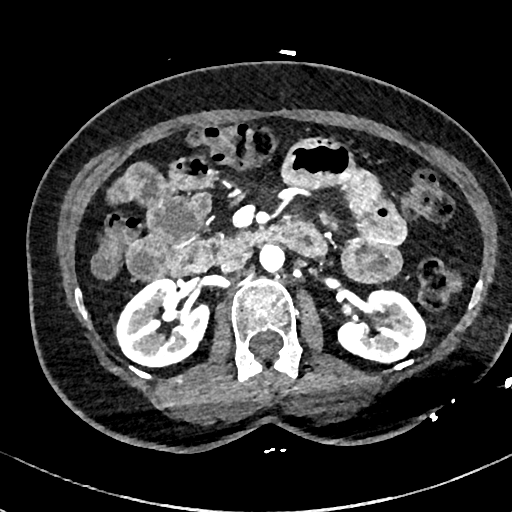
[im 178/490  mediastinal]
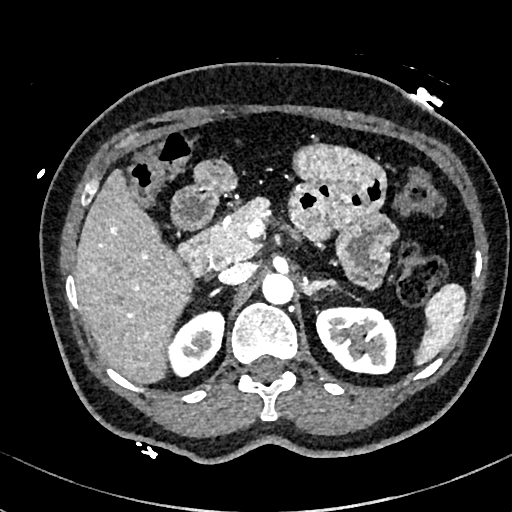
[im 223/490  mediastinal]
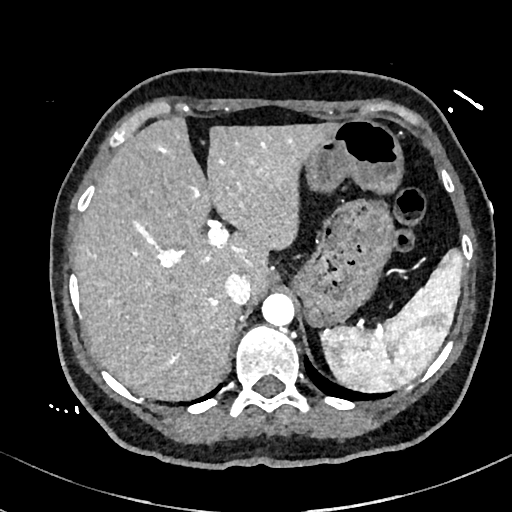
[im 267/490  mediastinal]
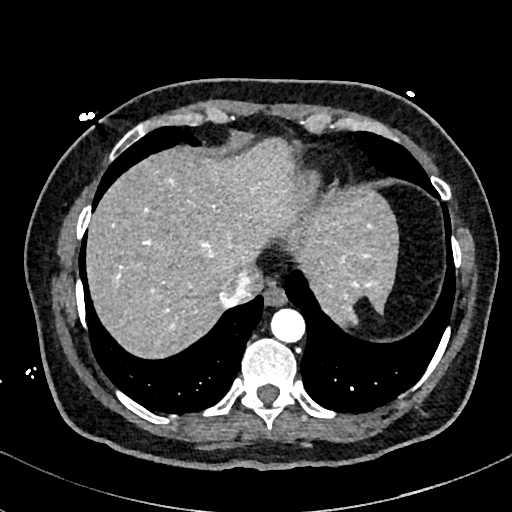
[im 312/490  mediastinal]
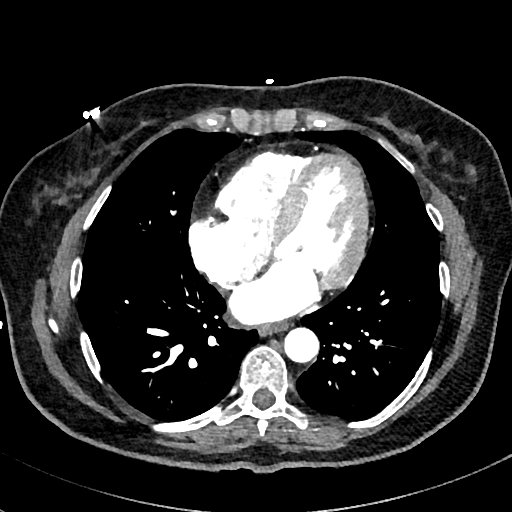
[im 312/490  lung]
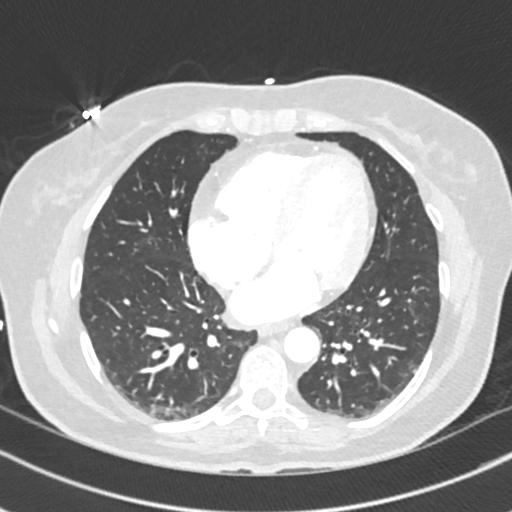
[im 356/490  mediastinal]
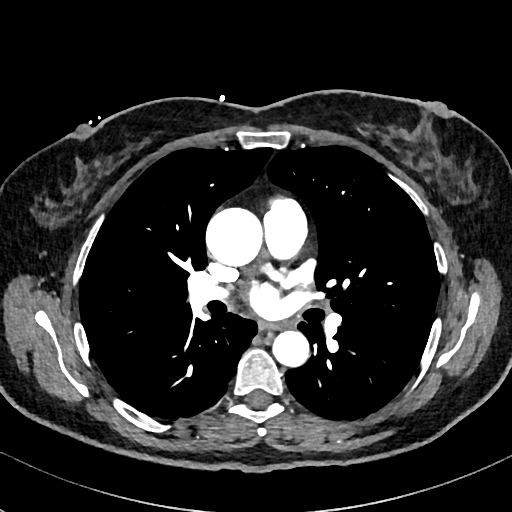
[im 356/490  lung]
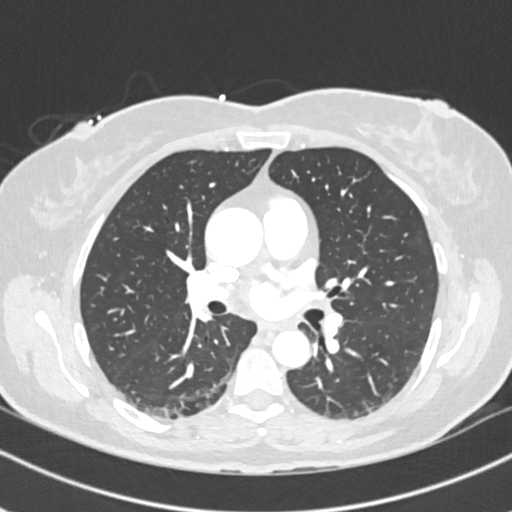
[im 401/490  mediastinal]
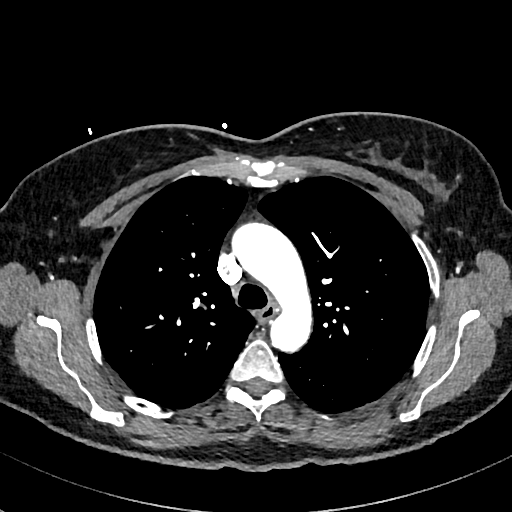
[im 401/490  lung]
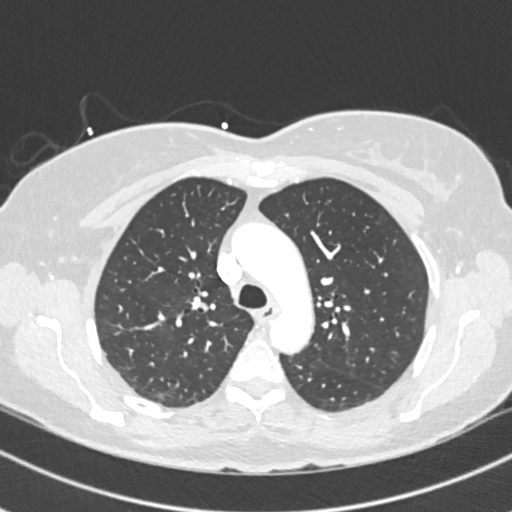
[im 401/490  bone]
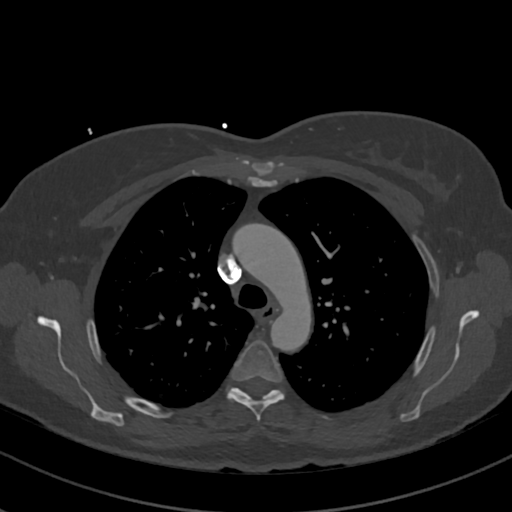
[im 445/490  mediastinal]
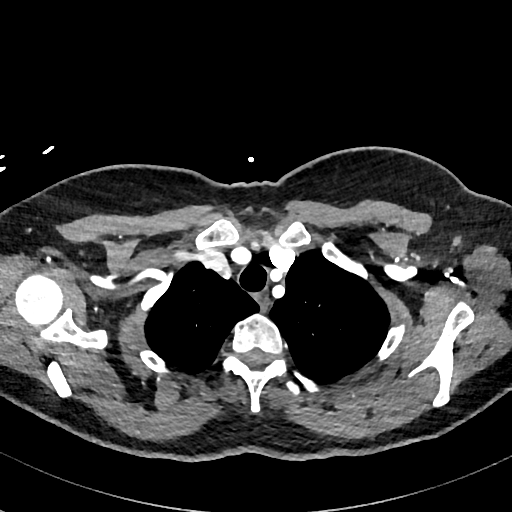
[im 445/490  lung]
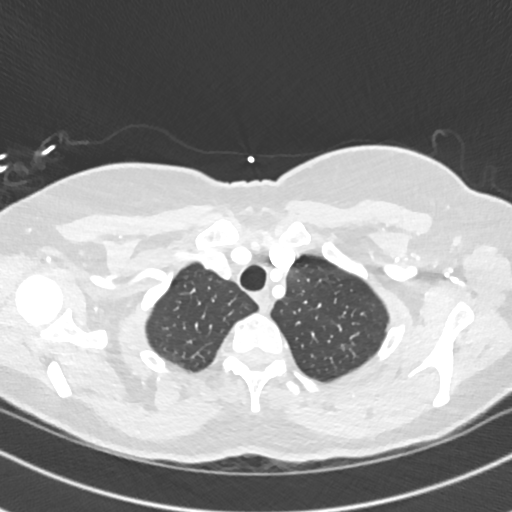

[10 of 38 positions shown; findings below may reference images not displayed]

Multidetector CT imaging through the chest, abdomen and pelvis was
performed using the standard protocol during bolus administration of
intravenous contrast. Multiplanar reconstructed images and MIPs were
obtained and reviewed to evaluate the vascular anatomy.

CONTRAST:  100mL OMNIPAQUE IOHEXOL 350 MG/ML IV.
FINDINGS: CTA CHEST FINDINGS

Cardiovascular: No visible calcified plaque on the unenhanced
images. Contrast opacification of the systemic arteries is
excellent. No evidence of thoracic aortic dissection. Ascending
thoracic aortic aneurysm measuring approximately 3.9 cm in diameter
(corresponding with the size identified at echocardiography per Dr.
Ceejay note from earlier today). No visible soft plaque within the
thoracic aorta.

Heart size upper normal to slightly enlarged with evidence of LEFT
ventricular hypertrophy. Marked thickening of the aortic valve with
associated dense calcification. While not done for coronary artery
evaluation, there is calcified and noncalcified plaque in the LAD.
No visible coronary atherosclerosis elsewhere.

Mediastinum/Nodes: No pathologically enlarged mediastinal, hilar or
axillary lymph nodes. No mediastinal masses. Normal-appearing
esophagus. Visualized thyroid gland normal in appearance.

Lungs/Pleura: As mentioned on the CT a earlier today, there is a 4
mm perifissural nodule adjacent to the RIGHT major fissure in the
RIGHT MIDDLE LOBE. Lung parenchyma otherwise clear apart from
dependent atelectasis posteriorly in the lower lobes. Central
airways patent without significant bronchial wall thickening. No
pleural effusions.

Musculoskeletal: Regional skeleton unremarkable without acute or
significant osseous abnormality.

Review of the MIP images confirms the above findings.

CTA ABDOMEN AND PELVIS FINDINGS

VASCULAR

Aorta: Mild atherosclerosis with calcified and noncalcified plaque
distally. No evidence of aneurysm or dissection.

Celiac: Moderate origin stenosis due to compression by the arcuate
ligament of the diaphragm.

SMA: Widely patent without atherosclerosis.

Renals: Single renal arteries bilaterally, widely patent without
atherosclerosis.

IMA: Widely patent without atherosclerosis.

Inflow: Widely patent iliofemoral arteries bilaterally. Mild
atherosclerosis involving the distal LEFT common iliac artery. No
visible atherosclerosis elsewhere.

Veins: Not evaluated.

Review of the MIP images confirms the above findings.

NON-VASCULAR

Hepatobiliary: Liver normal in size and appearance. Surgically
absent gallbladder. No unexpected biliary ductal dilation.

Pancreas: Normal in appearance without evidence of mass, ductal
dilation, or inflammation.

Spleen: Normal in size and appearance.

Adrenals/Urinary Tract: Normal appearing adrenal glands. Calculi in
both kidneys, including 2 calculi in adjacent upper pole calices of
the LEFT kidney on the order 2-3 mm, a small 1 2 mm calculus in a
mid calyx of the LEFT kidney, a 5 mm calculus in a pole calyx of the
LEFT kidney and a small 1-2 mm calculus in a LOWER pole calyx of the
RIGHT kidney. No ureteral calculi on either side. No evidence of
hydronephrosis. No focal parenchymal abnormality involving either
kidney. Normal appearing urinary bladder.

Stomach/Bowel: Stomach normal in appearance for the degree of
distention. Mild dilation of a few loops of jejunum in the upper
abdomen, with associated mild wall thickening, though there is no
edema/inflammation in the surrounding mesenteric fat. Remaining
small bowel normal in appearance. Moderate stool burden throughout
the normal appearing colon. Normal appendix in the RIGHT upper
pelvis.

Lymphatic: No pathologic lymphadenopathy.

Reproductive: Surgically absent uterus.  No adnexal masses.

Other: None.

Musculoskeletal: Regional skeleton unremarkable without acute or
significant osseous abnormality.

Review of the MIP images confirms the above findings.
IMPRESSION: 1. No evidence of thoracic or abdominal aortic dissection.
2. Ascending thoracic aortic aneurysm measuring approximately 3.9 cm
in diameter (corresponding with the size identified at
echocardiography per Dr. Ceejay note from earlier today).
3. Solitary calcified and noncalcified plaque in the LAD.
4. Moderate origin stenosis involving the celiac artery due to
compression by the arcuate ligament of the diaphragm.
5. Bilateral non-obstructing renal calculi.
6. Mild dilation of a few loops of jejunum involving the upper
abdomen, with associated mild wall thickening, though there is no
edema/inflammation in the surrounding mesenteric fat. This may be
due to a localized ileus or enteritis.
7. Aortic Atherosclerosis ([id number]), mild.

I telephoned these results at the time of interpretation to Dr.
Jim of cardiology 11/06/2020 at [DATE] p.m.

## 2022-03-27 ENCOUNTER — Encounter: Payer: Self-pay | Admitting: Cardiology

## 2022-03-27 ENCOUNTER — Ambulatory Visit: Payer: Commercial Managed Care - HMO | Attending: Cardiology | Admitting: Cardiology

## 2022-03-27 VITALS — BP 124/78 | HR 64 | Ht 69.5 in | Wt 188.4 lb

## 2022-03-27 DIAGNOSIS — Q231 Congenital insufficiency of aortic valve: Secondary | ICD-10-CM | POA: Diagnosis not present

## 2022-03-27 DIAGNOSIS — Q230 Congenital stenosis of aortic valve: Secondary | ICD-10-CM

## 2022-03-27 DIAGNOSIS — R0602 Shortness of breath: Secondary | ICD-10-CM

## 2022-03-27 DIAGNOSIS — E785 Hyperlipidemia, unspecified: Secondary | ICD-10-CM | POA: Diagnosis not present

## 2022-03-27 DIAGNOSIS — I514 Myocarditis, unspecified: Secondary | ICD-10-CM

## 2022-03-27 NOTE — Progress Notes (Signed)
Primary Care Provider: Jene Every, MD Cardiologist: Olivia Hew, MD Electrophysiologist: None  Clinic Note: Chief Complaint  Patient presents with   Follow-up    Having exertional dyspnea and exercise intolerance.   Aortic Stenosis    Follow-up echocardiogram results showing progression of disease.   ===================================  ASSESSMENT/PLAN   Problem List Items Addressed This Visit       Cardiology Problems   Aortic stenosis due to bicuspid aortic valve - Primary (Chronic)    She is now shown significant progression of stenosis from estimated mean valve gradient of 34 mmHg up to 42 mmHg.  This is now in the severe range.  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  At this point with having some atypical chest discomfort progressive exertional dyspnea long enough out from her myocarditis, I think it is reasonable to consider referral for aortic valve replacement.    As for preop evaluation, she had a heart catheterization done a year and a half ago showing no evidence of coronary disease.  Not sure that she would truly require invasive left heart catheterization with coronary angiography or could be simply reevaluate with Coronary CTA as part of her CT evaluation?  Mcgrath Heart Cath along would be much less invasive with less risk.  I have referred her to Dr. Arvid Mcgrath from [facility] in structural heart clinic.  Depending on whether he feels strongly about performing a left and Mcgrath persistent the Mcgrath heart catheterization as part of preop evaluation, we will hold off on invasive evaluation for now.  I did discuss the risks, benefits alternatives and indications of both left and Mcgrath heart catheterization with the patient just in case.  See informed decision-making-consent note below      Relevant Orders   EKG 12-Lead (Completed)   Ambulatory  referral to Cardiothoracic Surgery   Dyslipidemia, goal LDL below 100 (Chronic)    LDL is 146.  We had chosen to avoid treating last year while she was recovering from cardiomyopathy and having issues with fatigue.  Once we have established plans going forward as far as management of her aortic valve disease, I think we probably need to consider treatment to reduce LDL least less than 100.      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  It was actually feeling pretty much back to normal by the beginning of spring.  Therefore, I think that her progression of dyspnea now is more related to the valve than myocarditis since EF is stable.        Other   SOB (shortness of breath)    Most predominant symptom is recurrence of exertional dyspnea.  Not with routine activity, but with carrying objects or going up stairs indicating more exertion than baseline.  Unlikely that she would have developed rapid onset or progression of coronary artery disease based on cath results.  Therefore most likely etiology is aortic stenosis.  Referred to [facility].      Relevant Orders   EKG 12-Lead (Completed)   Ambulatory referral to Cardiothoracic Surgery    ===================================  HPI:    Olivia Mcgrath is a 61 y.o. female with a PMH below who presents today for early callback follow-up to discuss recent echocardiogram finding showing progression of Bicuspid Aortic Valve Stenosis.  PMH: Bicuspid Aortic Valve -previously Moderate AS (mAV  gradient ~34 mmHg as of October 2022 => Follow-Up Echo July 2023 mean gradient 42 mmHg => Severe AS HTN HLD H/o Lower Extremity Superficial Venous Thrombosis H/o Chronic Myocarditis  Olivia Mcgrath was last seen on May 16, 2021 for follow-up of echocardiogram.  She was recovering from her recurrent episode of myocarditis having completed a 91-monthcourse of colchicine.  No further chest pain or  exertional dyspnea.  Just occasionally him to stop to catch her breath.  Has become quite out of shape.  Gradients actually improved once her clinical condition improved.  Noted some fatigue-recovering from COVID infection.  Improving dyspnea.  Recent Hospitalizations: None  Reviewed  CV studies:    The following studies were reviewed today: (if available, images/films reviewed: From Epic Chart or Care Everywhere) TTE 02/02/2022: EF 60 to 65%.  Normal LV function.  No RWMA.  Normal RV.  With mild RVSP elevation.  Bicuspid aortic valve with Severe Aortic Stenosis.  Mean gradient now 42 mmHg.  Up from 34 mmHg  Interval History:   Olivia BDingrequested an earlier than previously scheduled 2D echo because of some swelling issues and dyspnea.  She tells me that she does have exertional dyspnea most notably if she is carrying anything, or going up steps.  Usually walking on flat ground she is okay but this is a relatively new thing for her over the last several months.  She has not necessarily noticed any chest tightness that is routine, but has been present off and on.  Some of the chest discomfort can actually happen at rest.  She is not sure if some of this is heartburn or not.  She is also having some episodes of dizziness and lightheadedness but no actual syncope.  More consistent with near syncope.  She has not had any PND, orthopnea or true pedal edema.  No y symptoms to suggest arrhythmias.  CV Review of Symptoms (Summary) Cardiovascular ROS: positive for - chest pain, dyspnea on exertion, and near syncope with lightheadedness and dizziness negative for - edema, irregular heartbeat, loss of consciousness, orthopnea, palpitations, paroxysmal nocturnal dyspnea, rapid heart rate, shortness of breath, or TIA/amaurosis fugax or claudication Noted some left arm swelling in June, at that time however was not noticing exertional dyspnea like she is now.  REVIEWED OF SYSTEMS   Review of Systems   Constitutional:  Positive for malaise/fatigue (After having started to feel almost back to normal again back in the spring, she is now starting to notice fatigue and exercise intolerance.).  HENT:  Negative for congestion and nosebleeds.   Respiratory:  Negative for cough and hemoptysis.   Gastrointestinal:  Positive for heartburn. Negative for blood in stool and melena.  Genitourinary:  Negative for hematuria.  Neurological:  Positive for dizziness. Negative for loss of consciousness.  Psychiatric/Behavioral:  The patient is nervous/anxious.   Symptoms are otherwise negative but noted in HPI  I have reviewed and (if needed) personally updated the patient's problem list, medications, allergies, past medical and surgical history, social and family history.   PAST MEDICAL HISTORY   Past Medical History:  Diagnosis Date   Calcific aortic stenosis of bicuspid valve    Most Recent Echo April * Oct 2022: Mod Severe AS - mean gradient between 30 & 44 mmHg (3 echos & 1 cath).   [place]ocarditis [place] 11/09/[place]   Cardiac MRI 11/09/2020: Findings consistent with acute myocarditis involving the basal inferior wall and mid wall along the apical inferolateral wall.    PAST SURGICAL  HISTORY   Past Surgical History:  Procedure Laterality Date   ABDOMINAL HYSTERECTOMY  11/2008   Cardiac MRI  11/09/2020   Findings c/w ACUTE MYOCARDITIS - involving basal inferior wall and mid wall along the apical inferolateral wall.  LV function estimated 63%, RV function 57%.  Cardiac output-index 4.8L/min-2.4L/min/m   LAPAROSCOPIC CHOLECYSTECTOMY  05/2016   Mcgrath/LEFT HEART CATH AND CORONARY ANGIOGRAPHY N/A 11/08/2020   Procedure: Mcgrath/LEFT HEART CATH AND CORONARY ANGIOGRAPHY;  Surgeon: Martinique, Peter M, MD;  Location: [facility];  Service: Cardiovascular;; NORMAL CORONARY ARTERIES. MOD AS (mean AoV Gradient 30 mmHg, AVA 1.16 cm). RAP ~2 mmHg, RVP-EDP 24/0 mmHg-3 mmHg, PAP-mean 21/6 mmHg- 12 mmHg.  PCWP 7 mmhg.  LVP-EDP 122/32 mmHg-7 mmHg.  CO-CI 6.1-3.07.   TRANSTHORACIC ECHOCARDIOGRAM  03/2016   A) Bicuspid AoV - No veg. Mild thickening & Mod Calcification. Mild-Mod AS (mean grad 28 mmHg, peak grad 40.4 mmHg). Mild conc LVH. EF 55-60%. Normal DFxn;; B) 06/2019:; EF 55% to 60%. Nl Diastolic Fxn. AoV Dfficult to visualize -> Peak / Mean grad 43 & 26 mm Hg = MOD AS);; C 11/2019: Stable Mod-Severe AS (mean - peak grad 30-53 mmHg). EF 60-65%, no RWMA. Mild LAD dilation.   TRANSTHORACIC ECHOCARDIOGRAM  10/2020   A) 4/9: EF 60-65%.  Mild LA dil.  No R WMA.  Normal RV.  AoV not well-visualized.  Mod-Severe AS (mean-peak gradients 28.5 mmHg - 49.4 mmHg). Normal CVP. Normal MV. ;; B)  11/08/2020: Normal EF 55 to 60%.  Mild LVH.  GR 1 DD.  Mild LA dilation.  Bicuspid AoV (mod Ca++).  Fusion of non & Left coronary cusps.  ~ Severe AS: AVA estimated 0.9 cm.  Mean gradient 44 mmHg., peak ~71 mmHg.   TRANSTHORACIC ECHOCARDIOGRAM  05/13/2021   Moderate AoV calcification - Mod-Severe AS (Mean Gradient 34 mmHg, peak 54.5 mmHg, AVA 0.7 cm).  LVEF remains 60 to 65%.  No R WMA.  GR 1 DD.  Normal PAP.  Moderate LA dilation.     There is no immunization history on file for this patient.  MEDICATIONS/ALLERGIES   Current Meds  Medication Sig   acetaminophen (TYLENOL) 500 MG tablet Take 500 mg by mouth Mcgrath 6 (six) hours as needed for mild pain, moderate pain or headache.   escitalopram (LEXAPRO) 10 MG tablet Take 5 mg by mouth at bedtime.   estradiol (ESTRACE) 2 MG tablet Take 2 mg by mouth at bedtime.   Fluocinolone Acetonide 0.01 % OIL Place in ear(s) daily.   guaiFENesin (MUCINEX) 600 MG 12 hr tablet Take 600 mg by mouth as needed for cough or to loosen phlegm.   Multiple Vitamins-Minerals (MULTIVITAMIN WITH MINERALS) tablet Take 1 tablet by mouth at bedtime.   valACYclovir (VALTREX) 1000 MG tablet Take 1,000 mg by mouth 2 (two) times daily.    Allergies  Allergen Reactions   Sulfa Antibiotics Rash    SOCIAL  HISTORY/FAMILY HISTORY   Reviewed in Epic:  Pertinent findings:  Social History   Tobacco Use   Smoking status: Never   Smokeless tobacco: Never  Substance Use Topics   Alcohol use: Yes    Alcohol/week: 1.0 standard drink of alcohol    Types: 1 Glasses of wine per week   Drug use: No   Social History   Social History Narrative   Lives with husband  2 children.   Daughter is Therapist, sports on 2[employer] @ [employer].   Limited exercise - occasional walks up to 2 miles -  tries to walk~3-4/wek.    OBJCTIVE -PE, EKG, labs   Wt Readings from Last 3 Encounters:  03/27/22 188 lb 6.4 oz (85.5 kg)  05/16/21 188 lb (85.3 kg)  03/17/21 184 lb 9.6 oz (83.7 kg)    Physical Exam: BP 124/78   Pulse 64   Ht 5' 9.5" (1.765 m)   Wt 188 lb 6.4 oz (85.5 kg)   SpO2 98%   BMI 27.42 kg/m  Physical Exam Vitals reviewed.  Constitutional:      General: She is not in acute distress.    Appearance: Normal appearance. She is normal weight. She is not ill-appearing or toxic-appearing.     Comments: Healthy-appearing.  Well-nourished and well-groomed.  HENT:     Head: Normocephalic and atraumatic.  Neck:     Vascular: Decreased carotid pulses (Delayed carotid upstroke). No carotid bruit (Radiated aortic valve murmur) or JVD.  Cardiovascular:     Rate and Rhythm: Normal rate and regular rhythm. No extrasystoles are present.    Chest Wall: PMI is not displaced.     Pulses: Normal pulses.     Heart sounds: Murmur (4/6 SEM at RUSB-neck) heard.     No gallop.     Comments: Normal S1 with decreased S2 Pulmonary:     Effort: Pulmonary effort is normal. No respiratory distress.     Breath sounds: Normal breath sounds. No wheezing, rhonchi or rales.  Musculoskeletal:        General: No swelling. Normal range of motion.     Cervical back: Normal range of motion and neck supple.  Skin:    General: Skin is warm and dry.  Neurological:     General: No focal deficit present.     Mental Status: She is alert and  oriented to person, place, and time.  Psychiatric:        Mood and Affect: Mood normal.        Behavior: Behavior normal.        Thought Content: Thought content normal.        Judgment: Judgment normal.     Adult ECG Report  Rate: 64 ;  Rhythm: normal sinus rhythm and left axis deviation.  Otherwise normal voltage, intervals, and durations ;   Narrative Interpretation: Stable  Recent Labs: No labs since these listed below. Lab Results  Component Value Date   CHOL 223 (H) 11/06/2020   HDL 58 11/06/2020   LDLCALC 146 (H) 11/06/2020   TRIG 93 11/06/2020   CHOLHDL 3.8 11/06/2020   Lab Results  Component Value Date   CREATININE 0.78 03/13/2021   BUN 15 03/13/2021   NA 138 03/13/2021   K 3.6 03/13/2021   CL 103 03/13/2021   CO2 27 03/13/2021      Latest Ref Rng & Units 03/13/2021    6:47 PM 11/09/2020    4:19 AM 11/08/2020    1:48 PM  CBC  WBC 4.0 - 10.5 K/uL 7.0  6.6    Hemoglobin 12.0 - 15.0 g/dL 13.8  13.9  12.9   Hematocrit 36.0 - 46.0 % 40.2  40.9  38.0   Platelets 150 - 400 K/uL 261  218      Lab Results  Component Value Date   HGBA1C 5.2 11/07/2020   Lab Results  Component Value Date   TSH 1.561 11/06/2020    ================================================== I spent a total of 27 minutes with the patient spent in direct patient consultation.  Additional time spent with chart review  /  charting (studies, outside notes, etc): 18 min Total Time: 45 min  Current medicines are reviewed at length with the patient today.  (+/- concerns) none  Notice: This dictation was prepared with Dragon dictation along with smart phrase technology. Any transcriptional errors that result from this process are unintentional and may not be corrected upon review.  Studies Ordered:   Orders Placed This Encounter  Procedures   Ambulatory referral to Cardiothoracic Surgery   EKG 12-Lead   No orders of the defined types were placed in this encounter.  Shared Decision  Making/Informed Consent The risks [stroke (1 in 1000), death (1 in 1000), kidney failure [usually temporary] (1 in 500), bleeding (1 in 200), allergic reaction [possibly serious] (1 in 200)], benefits (diagnostic support and management of coronary artery disease) and alternatives of a cardiac catheterization were discussed in detail with Ms. Cleaves and she is willing to proceed.   Patient Instructions / Medication Changes & Studies & Tests Ordered   Patient Instructions  Medication Instructions:   Not needed  *If you need a refill on your cardiac medications before your next appointment, please call your pharmacy*   Lab Work:  Not needed     Testing/Procedures: Not needed   Follow-Up: At [facility], you and your health needs are our priority.  As part of our continuing mission to provide you with exceptional heart care, we have created designated Provider Care Teams.  These Care Teams include your primary Cardiologist (physician) and Advanced Practice Providers (APPs -  Physician Assistants and Nurse Practitioners) who all work together to provide you with the care you need, when you need it.  We recommend signing up for the patient portal called "MyChart".  Sign up information is provided on this After Visit Summary.  MyChart is used to connect with patients for Virtual Visits (Telemedicine).  Patients are able to view lab/test results, encounter notes, upcoming appointments, etc.  Non-urgent messages can be sent to your provider as well.   To learn more about what you can do with MyChart, go to [email or web address].    Your next appointment:   6 month(s)  The format for your next appointment:   In Person  Provider:   Glenetta Hew, MD   You have been referred to  Cardiovascular Surgery ( [facility])- Dr Cyndia Bent- sever aortic stenosis       Leonie Man, MD, MS Olivia Mcgrath, M.D., M.S. Interventional Cardiologist  [place]  Pager #  [phone] Phone # [phone] [address] [place], [place] [address]   Thank you for choosing [facility]!!

## 2022-03-27 NOTE — Patient Instructions (Addendum)
Medication Instructions:   Not needed  *If you need a refill on your cardiac medications before your next appointment, please call your pharmacy*   Lab Work:  Not needed     Testing/Procedures: Not needed   Follow-Up: At [facility], you and your health needs are our priority.  As part of our continuing mission to provide you with exceptional heart care, we have created designated Provider Care Teams.  These Care Teams include your primary Cardiologist (physician) and Advanced Practice Providers (APPs -  Physician Assistants and Nurse Practitioners) who all work together to provide you with the care you need, when you need it.  We recommend signing up for the patient portal called "MyChart".  Sign up information is provided on this After Visit Summary.  MyChart is used to connect with patients for Virtual Visits (Telemedicine).  Patients are able to view lab/test results, encounter notes, upcoming appointments, etc.  Non-urgent messages can be sent to your provider as well.   To learn more about what you can do with MyChart, go to [email or web address].    Your next appointment:   6 month(s)  The format for your next appointment:   In Person  Provider:   Glenetta Hew, MD   You have been referred to  Cardiovascular Surgery ( [facility])- Dr Cyndia Bent- sever aortic stenosis

## 2022-04-03 ENCOUNTER — Encounter: Payer: Self-pay | Admitting: Cardiology

## 2022-04-03 NOTE — Assessment & Plan Note (Signed)
Symptoms seem to finally resolved over the course of the wintertime.  It was actually feeling pretty much back to normal by the beginning of spring.  Therefore, I think that her progression of dyspnea now is more related to the valve than myocarditis since EF is stable.

## 2022-04-03 NOTE — Assessment & Plan Note (Signed)
She is now shown significant progression of stenosis from estimated mean valve gradient of 34 mmHg up to 42 mmHg.  This is now in the severe range.  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  At this point with having some atypical chest discomfort progressive exertional dyspnea long enough out from her myocarditis, I think it is reasonable to consider referral for aortic valve replacement.    As for preop evaluation, she had a heart catheterization done a year and a half ago showing no evidence of coronary disease.  Not sure that she would truly require invasive left heart catheterization with coronary angiography or could be simply reevaluate with Coronary CTA as part of her CT evaluation?  Right Heart Cath along would be much less invasive with less risk.  I have referred her to Dr. Arvid Right from [facility] in [facility] clinic.  Depending on whether he feels strongly about performing a left and right persistent the right heart catheterization as part of preop evaluation, we will hold off on invasive evaluation for now.  I did discuss the risks, benefits alternatives and indications of both left and right heart catheterization with the patient just in case.  See informed decision-making-consent note below

## 2022-04-03 NOTE — Assessment & Plan Note (Signed)
LDL is 146.  We had chosen to avoid treating last year while she was recovering from cardiomyopathy and having issues with fatigue.  Once we have established plans going forward as far as management of her aortic valve disease, I think we probably need to consider treatment to reduce LDL least less than 100.

## 2022-04-03 NOTE — Assessment & Plan Note (Signed)
Most predominant symptom is recurrence of exertional dyspnea.  Not with routine activity, but with carrying objects or going up stairs indicating more exertion than baseline.  Unlikely that she would have developed rapid onset or progression of coronary artery disease based on cath results.  Therefore most likely etiology is aortic stenosis.  Referred to [facility].

## 2022-04-07 ENCOUNTER — Other Ambulatory Visit (HOSPITAL_COMMUNITY): Payer: BC Managed Care – PPO

## 2022-05-10 ENCOUNTER — Other Ambulatory Visit: Payer: Self-pay | Admitting: *Deleted

## 2022-05-10 ENCOUNTER — Encounter: Payer: Self-pay | Admitting: *Deleted

## 2022-05-10 ENCOUNTER — Institutional Professional Consult (permissible substitution): Payer: Commercial Managed Care - HMO | Admitting: Surgery

## 2022-05-10 ENCOUNTER — Encounter: Payer: Self-pay | Admitting: Surgery

## 2022-05-10 VITALS — BP 152/92 | HR 82 | Resp 18 | Ht 69.0 in | Wt 191.0 lb

## 2022-05-10 DIAGNOSIS — D224 Melanocytic nevi of scalp and neck: Secondary | ICD-10-CM | POA: Insufficient documentation

## 2022-05-10 DIAGNOSIS — D225 Melanocytic nevi of trunk: Secondary | ICD-10-CM | POA: Insufficient documentation

## 2022-05-10 DIAGNOSIS — D2371 Other benign neoplasm of skin of right lower limb, including hip: Secondary | ICD-10-CM | POA: Insufficient documentation

## 2022-05-10 DIAGNOSIS — L821 Other seborrheic keratosis: Secondary | ICD-10-CM | POA: Insufficient documentation

## 2022-05-10 DIAGNOSIS — I350 Nonrheumatic aortic (valve) stenosis: Secondary | ICD-10-CM

## 2022-05-10 NOTE — Progress Notes (Signed)
Cardiothoracic Surgery Admission History and Physical  PCP is Jene Every, MD Referring Provider is Leonie Man, MD  Chief Complaint  Patient presents with   Aortic Stenosis    Surgical consult,    HPI:  The patient is a 61 year old woman with a history of bicuspid aortic valve with moderate aortic stenosis with a mean gradient of 30 mmHg and July 2021.  She was fairly asymptomatic at that time but was not doing too much activity due to recovering from an ankle fracture requiring ORIF.  She subsequently developed COVID-19 infection in August 2021 and was treated with oxygen in the hospital as well as remdesivir and steroids.  She recovered but then had a second episode of COVID in January 2022.  She subsequently presented in April 2022 with substernal chest pain and had 9 specific EKG changes with an elevated troponin.  There was no evidence of PE on CT scan.  Echocardiogram showed a mean gradient across aortic valve of 44 mmHg with a dimensionless index of 0.24 consistent with severe aortic stenosis.  Aortic valve area was 0.9 cm.  Left ventricular ejection fraction was 60 to 65% with mild LVH and grade 1 diastolic dysfunction.  Right ventricular systolic function was normal.  She underwent cardiac catheterization on 11/08/2020 showing normal coronary anatomy.  The mean gradient across the aortic valve was measured at 30 mmHg with a valve area 1.16 cm.  Left and right heart filling pressures were normal with normal cardiac output.  She subsequently underwent a cardiac MRI on 11/09/2020 that was read as being consistent with acute myocarditis.  Left and right ventricular ejection fractions were normal.  She was treated with colchicine.  Her chest pain and shortness of breath gradually resolved.  She now presents with progressive exertional fatigue and shortness of breath as well as some dizziness without syncope.  Her symptoms seem to be worse when she is carrying things.  A follow-up  echocardiogram on 02/02/2022 shows bicuspid aortic valve with a mean gradient of 42 mmHg.  Ejection fraction is 60 to 65%.  She is here today with her daughter who is a Marine scientist and used to work in the cardiac intensive care unit.  She works as a Copywriter, advertising. Past Medical History:  Diagnosis Date   Calcific aortic stenosis of bicuspid valve    Most Recent Echo April * Oct 2022: Mod Severe AS - mean gradient between 30 & 44 mmHg (3 echos & 1 cath).   Myocarditis ([place]) 11/09/2020   Cardiac MRI 11/09/2020: Findings consistent with acute myocarditis involving the basal inferior wall and mid wall along the apical inferolateral wall.    Past Surgical History:  Procedure Laterality Date   ABDOMINAL HYSTERECTOMY  11/2008   Cardiac MRI  11/09/2020   Findings c/w ACUTE MYOCARDITIS - involving basal inferior wall and mid wall along the apical inferolateral wall.  LV function estimated 63%, RV function 57%.  Cardiac output-index 4.8L/min-2.4L/min/m   LAPAROSCOPIC CHOLECYSTECTOMY  05/2016   RIGHT/LEFT HEART CATH AND CORONARY ANGIOGRAPHY N/A 11/08/2020   Procedure: RIGHT/LEFT HEART CATH AND CORONARY ANGIOGRAPHY;  Surgeon: Martinique, Peter M, MD;  Location: [facility];  Service: Cardiovascular;; NORMAL CORONARY ARTERIES. MOD AS (mean AoV Gradient 30 mmHg, AVA 1.16 cm). RAP ~2 mmHg, RVP-EDP 24/0 mmHg-3 mmHg, PAP-mean 21/6 mmHg- 12 mmHg.  PCWP 7 mmhg. LVP-EDP 122/32 mmHg-7 mmHg.  CO-CI 6.1-3.07.   TRANSTHORACIC ECHOCARDIOGRAM  03/2016   A) Bicuspid AoV - No veg. Mild thickening & Mod  Calcification. Mild-Mod AS (mean grad 28 mmHg, peak grad 40.4 mmHg). Mild conc LVH. EF 55-60%. Normal DFxn;; B) 06/2019:; EF 55% to 60%. Nl Diastolic Fxn. AoV Dfficult to visualize -> Peak / Mean grad 43 & 26 mm Hg = MOD AS);; C 11/2019: Stable Mod-Severe AS (mean - peak grad 30-53 mmHg). EF 60-65%, no RWMA. Mild LAD dilation.   TRANSTHORACIC ECHOCARDIOGRAM  10/2020   A) 4/9: EF 60-65%.  Mild LA dil.  No R WMA.  Normal RV.   AoV not well-visualized.  Mod-Severe AS (mean-peak gradients 28.5 mmHg - 49.4 mmHg). Normal CVP. Normal MV. ;; B)  11/08/2020: Normal EF 55 to 60%.  Mild LVH.  GR 1 DD.  Mild LA dilation.  Bicuspid AoV (mod Ca++).  Fusion of non & Left coronary cusps.  ~ Severe AS: AVA estimated 0.9 cm.  Mean gradient 44 mmHg., peak ~71 mmHg.   TRANSTHORACIC ECHOCARDIOGRAM  05/13/2021   Moderate AoV calcification - Mod-Severe AS (Mean Gradient 34 mmHg, peak 54.5 mmHg, AVA 0.7 cm).  LVEF remains 60 to 65%.  No R WMA.  GR 1 DD.  Normal PAP.  Moderate LA dilation.    Family History  Problem Relation Age of Onset   Rheum arthritis Mother        Age 41 (2018)   Non-Hodgkin's lymphoma Mother    Hypertension Mother    Hyperlipidemia Mother    Lung cancer Father    Hypertension Father    Hyperlipidemia Father    Rheum arthritis Brother        20 ((2018   Cancer Maternal Grandmother    Atrial fibrillation Maternal Grandmother    Cancer Paternal Grandmother    Heart disease Paternal Grandmother    Cancer Paternal Grandfather     Social History Social History   Tobacco Use   Smoking status: Never   Smokeless tobacco: Never  Substance Use Topics   Alcohol use: Yes    Alcohol/week: 1.0 standard drink of alcohol    Types: 1 Glasses of wine per week   Drug use: No    Current Outpatient Medications  Medication Sig Dispense Refill   acetaminophen (TYLENOL) 500 MG tablet Take 500 mg by mouth every 6 (six) hours as needed for mild pain, moderate pain or headache.     escitalopram (LEXAPRO) 10 MG tablet Take 5 mg by mouth at bedtime.     estradiol (ESTRACE) 2 MG tablet Take 2 mg by mouth at bedtime.     Fluocinolone Acetonide 0.01 % OIL Place in ear(s) daily.     guaiFENesin (MUCINEX) 600 MG 12 hr tablet Take 600 mg by mouth as needed for cough or to loosen phlegm.     Multiple Vitamins-Minerals (MULTIVITAMIN WITH MINERALS) tablet Take 1 tablet by mouth at bedtime.     valACYclovir (VALTREX) 1000 MG tablet  Take 1,000 mg by mouth 2 (two) times daily.     No current facility-administered medications for this visit.    Allergies  Allergen Reactions   Sulfa Antibiotics Rash    Review of Systems  Constitutional:  Positive for activity change and fatigue. Negative for fever.  HENT: Negative.  Negative for dental problem.   Eyes:        Wears glasses  Respiratory:  Positive for shortness of breath.   Cardiovascular:  Positive for chest pain. Negative for leg swelling.  Gastrointestinal: Negative.   Endocrine: Negative.   Genitourinary: Negative.   Musculoskeletal:  Positive for arthralgias.  Skin: Negative.  Allergic/Immunologic: Negative.   Neurological:  Positive for dizziness. Negative for syncope.  Hematological: Negative.   Psychiatric/Behavioral:  The patient is nervous/anxious.     BP (!) 152/92 (BP Location: Left Arm, Patient Position: Sitting)   Pulse 82   Resp 18   Ht '5\' 9"'$  (1.753 m)   Wt 191 lb (86.6 kg)   SpO2 100%   BMI 28.21 kg/m  Physical Exam Constitutional:      Appearance: Normal appearance.  HENT:     Head: Normocephalic and atraumatic.  Eyes:     Extraocular Movements: Extraocular movements intact.     Conjunctiva/sclera: Conjunctivae normal.     Pupils: Pupils are equal, round, and reactive to light.  Cardiovascular:     Rate and Rhythm: Normal rate and regular rhythm.     Pulses: Normal pulses.     Heart sounds: Murmur heard.     Comments: 3/6 systolic murmur along the right sternal border.  There is no diastolic murmur. Pulmonary:     Effort: Pulmonary effort is normal.     Breath sounds: Normal breath sounds.  Abdominal:     General: There is no distension.     Tenderness: There is no abdominal tenderness.  Musculoskeletal:        General: No swelling. Normal range of motion.     Cervical back: Normal range of motion and neck supple.  Skin:    General: Skin is warm and dry.  Neurological:     General: No focal deficit present.     Mental  Status: She is alert and oriented to person, place, and time.  Psychiatric:        Mood and Affect: Mood normal.        Behavior: Behavior normal.     Diagnostic Tests:    ECHOCARDIOGRAM REPORT         Patient Name:   Joneen Caraway  Date of Exam: 02/02/2022  Medical Rec #:  [id number]     Height:       69.0 in  Accession #:    [id number]    Weight:       188.0 lb  Date of Birth:  1961-01-02     BSA:          2.012 m  Patient Age:    64 years      BP:           138/88 mmHg  Patient Gender: F             HR:           68 bpm.  Exam Location:  [facility]   Procedure: 2D Echo, 3D Echo, Cardiac Doppler, Color Doppler and Strain  Analysis   Indications:    Q23.1 Bicuspid aortic valve     History:        Patient has prior history of Echocardiogram examinations,  most                  recent 05/13/2021. Aortic Valve Disease and Bicuspid  aortic                  valve, Signs/Symptoms:Shortness of Breath; Risk                  Factors:Dyslipidemia. Myocarditis. DVT. COVID-19.     Sonographer:    Basilia Jumbo BS, RDCS  Referring Phys: Glenetta Hew, W   IMPRESSIONS     1. Left ventricular ejection fraction, by estimation, is  60 to 65%. The  left ventricle has normal function. The left ventricle has no regional  wall motion abnormalities. Left ventricular diastolic parameters are  indeterminate. The average left  ventricular global longitudinal strain is -22.6 %. The global longitudinal  strain is normal.   2. Right ventricular systolic function is normal. The right ventricular  size is normal. There is mildly elevated pulmonary artery systolic  pressure.   3. Left atrial size was mild to moderately dilated.   4. The mitral valve is normal in structure. Trivial mitral valve  regurgitation. No evidence of mitral stenosis.   5. The aortic valve is bicuspid. Aortic valve regurgitation is not  visualized. Severe aortic valve stenosis. Aortic valve mean gradient  measures 42.0  mmHg. Aortic valve Vmax measures 4.08 m/s.   6. The inferior vena cava is dilated in size with <50% respiratory  variability, suggesting right atrial pressure of 15 mmHg.   Comparison(s): Changes from prior study are noted. 05/13/21 EF 60-65%.  Moderate-severe AS 33mHg mean PG, 571mg peak PG. Ascending aorta 40 mm.   Conclusion(s)/Recommendation(s): Severe bicuspid aortic valve stenosis by  gradient, peak velocity, and DVI. Ascending aorta not well visualized but  previously with mild dilation.   FINDINGS   Left Ventricle: Left ventricular ejection fraction, by estimation, is 60  to 65%. The left ventricle has normal function. The left ventricle has no  regional wall motion abnormalities. The average left ventricular global  longitudinal strain is -22.6 %.  The global longitudinal strain is normal. The left ventricular internal  cavity size was normal in size. There is no left ventricular hypertrophy.  Left ventricular diastolic parameters are indeterminate.   Right Ventricle: The right ventricular size is normal. Right vetricular  wall thickness was not well visualized. Right ventricular systolic  function is normal. There is mildly elevated pulmonary artery systolic  pressure. The tricuspid regurgitant velocity   is 2.37 m/s, and with an assumed right atrial pressure of 15 mmHg, the  estimated right ventricular systolic pressure is 3716.1mHg.   Left Atrium: Left atrial size was mild to moderately dilated.   Right Atrium: Right atrial size was normal in size.   Pericardium: There is no evidence of pericardial effusion.   Mitral Valve: The mitral valve is normal in structure. Trivial mitral  valve regurgitation. No evidence of mitral valve stenosis.   Tricuspid Valve: The tricuspid valve is normal in structure. Tricuspid  valve regurgitation is trivial. No evidence of tricuspid stenosis.   Aortic Valve: Fusion of L-N coronary cusps. The aortic valve is bicuspid.  Aortic  valve regurgitation is not visualized. Severe aortic stenosis is  present. Aortic valve mean gradient measures 42.0 mmHg. Aortic valve peak  gradient measures 66.6 mmHg.  Aortic valve area, by VTI measures 1.01 cm.   Pulmonic Valve: The pulmonic valve was not well visualized. Pulmonic valve  regurgitation is trivial. No evidence of pulmonic stenosis.   Aorta: The ascending aorta was not well visualized.   Venous: The inferior vena cava is dilated in size with less than 50%  respiratory variability, suggesting right atrial pressure of 15 mmHg.   IAS/Shunts: The atrial septum is grossly normal.      LEFT VENTRICLE  PLAX 2D  LVIDd:         4.40 cm   Diastology  LVIDs:         3.00 cm   LV e' medial:    8.32 cm/s  LV PW:  1.00 cm   LV E/e' medial:  12.1  LV IVS:        1.20 cm   LV e' lateral:   8.50 cm/s  LVOT diam:     2.40 cm   LV E/e' lateral: 11.9  LV SV:         113  LV SV Index:   56        2D Longitudinal Strain  LVOT Area:     4.52 cm  2D Strain GLS (A2C):   -18.4 %                           2D Strain GLS (A3C):   -26.2 %                           2D Strain GLS (A4C):   -23.1 %                           2D Strain GLS Avg:     -22.6 %                              3D Volume EF:                           3D EF:        58 %                           LV EDV:       135 ml                           LV ESV:       57 ml                           LV SV:        78 ml   RIGHT VENTRICLE             IVC  RV Basal diam:  3.50 cm     IVC diam: 2.20 cm  RV S prime:     11.00 cm/s  TAPSE (M-mode): 2.8 cm  RVSP:           30.5 mmHg   LEFT ATRIUM             Index        RIGHT ATRIUM           Index  LA diam:        4.40 cm 2.19 cm/m   RA Pressure: 8.00 mmHg  LA Vol (A2C):   75.6 ml 37.57 ml/m  RA Area:     14.00 cm  LA Vol (A4C):   68.2 ml 33.89 ml/m  RA Volume:   33.70 ml  16.75 ml/m  LA Biplane Vol: 73.3 ml 36.42 ml/m   AORTIC VALVE  AV Area (Vmax):    1.08 cm  AV  Area (Vmean):   0.99 cm  AV Area (VTI):     1.01 cm  AV Vmax:           408.00 cm/s  AV Vmean:          305.000 cm/s  AV VTI:            1.115 m  AV Peak Grad:      66.6 mmHg  AV Mean Grad:      42.0 mmHg  LVOT Vmax:         97.10 cm/s  LVOT Vmean:        66.900 cm/s  LVOT VTI:          0.249 m  LVOT/AV VTI ratio: 0.22     AORTA  Ao Root diam: 3.10 cm   MITRAL VALVE                TRICUSPID VALVE                              TR Peak grad:   22.5 mmHg  MV Decel Time: 239 msec     TR Vmax:        237.00 cm/s  MV E velocity: 101.00 cm/s  Estimated RAP:  8.00 mmHg  MV A velocity: 98.50 cm/s   RVSP:           30.5 mmHg  MV E/A ratio:  1.03                              SHUNTS                              Systemic VTI:  0.25 m                              Systemic Diam: 2.40 cm   Buford Dresser MD  Electronically signed by Buford Dresser MD  Signature Date/Time: 02/02/2022/6:32:27 PM         Final     Physicians  Panel Physicians Referring Physician Case Authorizing Physician  Martinique, Peter M, MD (Primary)     Procedures  RIGHT/LEFT HEART CATH AND CORONARY ANGIOGRAPHY   Conclusion    The left ventricular systolic function is normal. LV end diastolic pressure is normal. The left ventricular ejection fraction is 55-65% by visual estimate. There is moderate aortic valve stenosis.   1. Normal coronary anatomy 2. Moderate Aortic stenosis. Mean gradient 30 mm Hg. AVA 1.16 cm squared with index 0.58 3. Normal LV filling pressures 4. Normal right heart pressures. 5. Normal cardiac output.   Plan; no clear cause for chest pain identified. Medical management.   Recommendations  Antiplatelet/Anticoag Recommend Aspirin '81mg'$  daily for moderate CAD.   Indications  Elevated troponin [R77.8 (ICD-10-CM)]   Procedural Details  Technical Details Indication: 61 yo WF with history of AS with bicuspid AV presented with chest pain and elevated  troponin  Procedural Details: The right wrist was prepped, draped, and anesthetized with 1% lidocaine. Using the modified Seldinger technique a 6 Fr slender sheath was placed in the right radial artery and a 5 French sheath was placed in the right brachial vein. A Swan-Ganz catheter was used for the right heart catheterization. Standard protocol was followed for recording of right heart pressures and sampling of oxygen saturations. Fick cardiac output was calculated. Standard Judkins catheters were used for selective coronary angiography and left ventriculography. There were no immediate procedural complications. The patient was transferred to the post catheterization recovery area for further monitoring. Contrast: 40 cc Estimated  blood loss <50 mL.   During this procedure medications were administered to achieve and maintain moderate conscious sedation while the patient's heart rate, blood pressure, and oxygen saturation were continuously monitored and I was present face-to-face 100% of this time.   Medications (Filter: Administrations occurring from 1240 to 1357 on 11/08/20)  important  Continuous medications are totaled by the amount administered until 11/08/20 1357.   midazolam (VERSED) injection (mg) Total dose:  3 mg  Date/Time Rate/Dose/Volume Action   11/08/20 1300 2 mg Given   1323 1 mg Given    fentaNYL (SUBLIMAZE) injection (mcg) Total dose:  50 mcg  Date/Time Rate/Dose/Volume Action   11/08/20 1300 25 mcg Given   1323 25 mcg Given    lidocaine (PF) (XYLOCAINE) 1 % injection (mL) Total volume:  4 mL  Date/Time Rate/Dose/Volume Action   11/08/20 1305 2 mL Given   1321 2 mL Given    Heparin (Porcine) in NaCl 1000-0.9 UT/500ML-% SOLN (mL) Total volume:  1,000 mL  Date/Time Rate/Dose/Volume Action   11/08/20 1306 500 mL Given   1306 500 mL Given    Radial Cocktail/Verapamil only (mL) Total volume:  10 mL  Date/Time Rate/Dose/Volume Action   11/08/20 1323 10 mL Given     heparin sodium (porcine) injection (Units) Total dose:  4,000 Units  Date/Time Rate/Dose/Volume Action   11/08/20 1343 4,000 Units Given    iohexol (OMNIPAQUE) 350 MG/ML injection (mL) Total volume:  40 mL  Date/Time Rate/Dose/Volume Action   11/08/20 1356 40 mL Given    zolpidem (AMBIEN) tablet 5 mg (mg) Total dose:  Cannot be calculated* Dosing weight:  79.4  *Administration dose not documented Date/Time Rate/Dose/Volume Action   11/08/20 1240 *Not included in total MAR Hold    acetaminophen (TYLENOL) tablet 650 mg (mg) Total dose:  Cannot be calculated* Dosing weight:  79.4  *Administration dose not documented Date/Time Rate/Dose/Volume Action   11/08/20 1240 *Not included in total MAR Hold    aspirin EC tablet 81 mg (mg) Total dose:  Cannot be calculated* Dosing weight:  79.4  *Administration dose not documented Date/Time Rate/Dose/Volume Action   11/08/20 1240 *Not included in total MAR Hold    atorvastatin (LIPITOR) tablet 80 mg (mg) Total dose:  Cannot be calculated* Dosing weight:  79.4  *Administration dose not documented Date/Time Rate/Dose/Volume Action   11/08/20 1240 *Not included in total MAR Hold    escitalopram (LEXAPRO) tablet 5 mg (mg) Total dose:  Cannot be calculated* Dosing weight:  79.4  *Administration dose not documented Date/Time Rate/Dose/Volume Action   11/08/20 1240 *Not included in total MAR Hold    estradiol (ESTRACE) tablet 2 mg (mg) Total dose:  Cannot be calculated* Dosing weight:  79.4  *Administration dose not documented Date/Time Rate/Dose/Volume Action   11/08/20 1240 *Not included in total MAR Hold    guaiFENesin (MUCINEX) 12 hr tablet 600 mg (mg) Total dose:  Cannot be calculated* Dosing weight:  79.4  *Administration dose not documented Date/Time Rate/Dose/Volume Action   11/08/20 1240 *Not included in total MAR Hold    metoprolol tartrate (LOPRESSOR) tablet 25 mg (mg) Total dose:  Cannot be calculated* Dosing  weight:  79.4  *Administration dose not documented Date/Time Rate/Dose/Volume Action   11/08/20 1240 *Not included in total MAR Hold    ondansetron (ZOFRAN) injection 4 mg (mg) Total dose:  Cannot be calculated* Dosing weight:  79.4  *Administration dose not documented Date/Time Rate/Dose/Volume Action   11/08/20 1240 *Not included in total MAR Hold  sodium chloride (OCEAN) 0.65 % nasal spray 1 spray (spray) Total dose:  Cannot be calculated* Dosing weight:  80.8  *Administration dose not documented Date/Time Rate/Dose/Volume Action   11/08/20 1240 *Not included in total MAR Hold    sodium chloride flush (NS) 0.9 % injection 3 mL (mL) Total dose:  Cannot be calculated* Dosing weight:  79.4  *Administration dose not documented Date/Time Rate/Dose/Volume Action   11/08/20 1240 *Not included in total MAR Hold    Sedation Time  Sedation Time Physician-1: 48 minutes 15 seconds Contrast  Medication Name Total Dose  iohexol (OMNIPAQUE) 350 MG/ML injection 40 mL   Radiation/Fluoro  Fluoro time: 5.5 (min) DAP: 8847 (mGycm2) Cumulative Air Kerma: 626 (mGy) Complications  Complications documented before study signed (11/08/2020  9:48 PM)   No complications were associated with this study.  Documented by Martinique, Peter M, MD - 11/08/2020  1:58 PM     Coronary Findings  Diagnostic Dominance: Right Left Main  Vessel was injected. Vessel is normal in caliber. Vessel is angiographically normal.    Left Anterior Descending  Vessel was injected. Vessel is normal in caliber. Vessel is angiographically normal.    Left Circumflex  Vessel was injected. Vessel is normal in caliber. Vessel is angiographically normal.    Right Coronary Artery  Vessel was injected. Vessel is normal in caliber. Vessel is angiographically normal.    Intervention   No interventions have been documented.   Wall Motion  Resting       All segments of the heart are normal.           Left  Heart  Left Ventricle The left ventricular size is normal. The left ventricular systolic function is normal. LV end diastolic pressure is normal. The left ventricular ejection fraction is 55-65% by visual estimate. No regional wall motion abnormalities.  Aortic Valve There is moderate aortic valve stenosis. The aortic valve is calcified.   Coronary Diagrams  Diagnostic Dominance: Right  Intervention  Implants     No implant documentation for this case.   Syngo Images   Show images for CARDIAC CATHETERIZATION Images on Long Term Storage   Show images for [employer], [place] Link to Procedure Log  Procedure Log    Hemo Data  Flowsheet Row Most Recent Value  Fick Cardiac Output 6.1 L/min  Fick Cardiac Output Index 3.07 (L/min)/BSA  Aortic Mean Gradient 29.11 mmHg  Aortic Peak Gradient 33 mmHg  Aortic Valve Area 1.16  Aortic Value Area Index 0.58 cm2/BSA  RA A Wave 3 mmHg  RA V Wave 2 mmHg  RA Mean 0 mmHg  RV Systolic Pressure 24 mmHg  RV Diastolic Pressure 0 mmHg  RV EDP 3 mmHg  PA Systolic Pressure 21 mmHg  PA Diastolic Pressure 6 mmHg  PA Mean 12 mmHg  PW A Wave 7 mmHg  PW V Wave 7 mmHg  PW Mean 5 mmHg  AO Systolic Pressure 546 mmHg  AO Diastolic Pressure 62 mmHg  AO Mean 85 mmHg  LV Systolic Pressure 270 mmHg  LV Diastolic Pressure 2 mmHg  LV EDP 7 mmHg  AOp Systolic Pressure 350 mmHg  AOp Diastolic Pressure 59 mmHg  AOp Mean Pressure 83 mmHg  LVp Systolic Pressure 093 mmHg  LVp Diastolic Pressure 2 mmHg  LVp EDP Pressure 13 mmHg  QP/QS 1  TPVR Index 3.91 HRUI  TSVR Index 27.71 HRUI  TPVR/TSVR Ratio 0.14   Narrative & Impression  CLINICAL DATA:  61 year old presenting with chest pain radiating into  back. Current history of aortic stenosis due to congenital bicuspid valve.   EXAM: CT ANGIOGRAPHY CHEST, ABDOMEN AND PELVIS (DISSECTION PROTOCOL)   TECHNIQUE: Non-contrast CT of the chest was initially obtained.   Multidetector CT imaging through the  chest, abdomen and pelvis was performed using the standard protocol during bolus administration of intravenous contrast. Multiplanar reconstructed images and MIPs were obtained and reviewed to evaluate the vascular anatomy.   CONTRAST:  167m OMNIPAQUE IOHEXOL 350 MG/ML IV.   COMPARISON:  CTA chest for pulmonary embolism earlier same day.   FINDINGS: CTA CHEST FINDINGS   Cardiovascular: No visible calcified plaque on the unenhanced images. Contrast opacification of the systemic arteries is excellent. No evidence of thoracic aortic dissection. 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 NKyla Balzarinenote from earlier today). No visible soft plaque within the thoracic aorta.   Heart size upper normal to slightly enlarged with evidence of LEFT ventricular hypertrophy. Marked thickening of the aortic valve with associated dense calcification. While not done for coronary artery evaluation, there is calcified and noncalcified plaque in the LAD. No visible coronary atherosclerosis elsewhere.   Mediastinum/Nodes: No pathologically enlarged mediastinal, hilar or axillary lymph nodes. No mediastinal masses. Normal-appearing esophagus. Visualized thyroid gland normal in appearance.   Lungs/Pleura: As mentioned on the CT a earlier today, there is a 4 mm perifissural nodule adjacent to the RIGHT major fissure in the RIGHT MIDDLE LOBE. Lung parenchyma otherwise clear apart from dependent atelectasis posteriorly in the lower lobes. Central airways patent without significant bronchial wall thickening. No pleural effusions.   Musculoskeletal: Regional skeleton unremarkable without acute or significant osseous abnormality.   Review of the MIP images confirms the above findings.   CTA ABDOMEN AND PELVIS FINDINGS   VASCULAR   Aorta: Mild atherosclerosis with calcified and noncalcified  plaque distally. No evidence of aneurysm or dissection.   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   SMA: Widely patent without atherosclerosis.   Renals: Single renal arteries bilaterally, widely patent without atherosclerosis.   IMA: Widely patent without atherosclerosis.   Inflow: Widely patent iliofemoral arteries bilaterally. Mild atherosclerosis involving the distal LEFT common iliac artery. No visible atherosclerosis elsewhere.   Veins: Not evaluated.   Review of the MIP images confirms the above findings.   NON-VASCULAR   Hepatobiliary: Liver normal in size and appearance. Surgically absent gallbladder. No unexpected biliary ductal dilation.   Pancreas: Normal in appearance without evidence of mass, ductal dilation, or inflammation.   Spleen: Normal in size and appearance.   Adrenals/Urinary Tract: Normal appearing adrenal glands. Calculi in both kidneys, including 2 calculi in adjacent upper pole calices of the LEFT kidney on the order 2-3 mm, a small 1 2 mm calculus in a mid calyx of the LEFT kidney, a 5 mm calculus in a pole calyx of the LEFT kidney and a small 1-2 mm calculus in a LOWER pole calyx of the RIGHT kidney. No ureteral calculi on either side. No evidence of hydronephrosis. No focal parenchymal abnormality involving either kidney. Normal appearing urinary bladder.   Stomach/Bowel: Stomach normal in appearance for the degree of distention. Mild dilation of a few loops of jejunum in the upper abdomen, with associated mild wall thickening, though there is no edema/inflammation in the surrounding mesenteric fat. Remaining small bowel normal in appearance. Moderate stool burden throughout the normal appearing colon. Normal appendix in the RIGHT upper pelvis.  Lymphatic: No pathologic lymphadenopathy.   Reproductive: Surgically absent uterus.  No adnexal masses.   Other: None.    Musculoskeletal: Regional skeleton unremarkable without acute or significant osseous abnormality.   Review of the MIP images confirms the above findings.   IMPRESSION: 1. No evidence of thoracic or abdominal aortic dissection. 2. 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 Kyla Balzarine note from earlier today). 3. Solitary calcified and noncalcified plaque in the LAD. 4. 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 5. Bilateral non-obstructing renal calculi. 6. Mild dilation of a few loops of jejunum involving the upper abdomen, with associated mild wall thickening, though there is no edema/inflammation in the surrounding mesenteric fat. This may be due to a localized ileus or enteritis. 7. Aortic Atherosclerosis (ICD10-I70.0), mild.   I telephoned these results at the time of interpretation to Dr. Johnsie Cancel of cardiology 11/06/2020 at 6:54 p.m.     Electronically Signed   By: Evangeline Dakin M.D.   On: 11/06/2020 19:10     Impression:  This 61 year old woman has stage D, severe, symptomatic bicuspid aortic stenosis with NYHA class II symptoms of exertional fatigue and shortness of breath consistent with chronic diastolic congestive heart failure.  She has also been having episodes of chest discomfort and dizziness.  I personally reviewed her 2D echocardiogram, cardiac catheterization, and CTA studies.  She has a heavily calcified bicuspid aortic valve with restricted leaflet mobility.  The mean gradient is 42 mmHg consistent with severe aortic stenosis.  Left ventricular ejection fraction is normal.  Cardiac catheterization in April 2022 showed no coronary disease.  I do not think that needs to be repeated.  CTA of the chest showed no evidence of aortic aneurysm disease.  I agree that aortic valve replacement is  indicated in this patient for relief of her symptoms and to prevent left ventricular dysfunction.  Given her age I think a bioprosthetic valve would be a good choice for her.  I discussed the alternatives of mechanical and bioprosthetic valves and she would like to avoid being on Coumadin if possible. I discussed the operative procedure with the patient and her daughter including alternatives, benefits and risks; including but not limited to bleeding, blood transfusion, infection, stroke, myocardial infarction, graft failure, heart block requiring a permanent pacemaker, organ dysfunction, and death.  Ericha Overley understands and agrees to proceed.   Plan:  She will call us back to schedule aortic valve replacement using a bioprosthetic valve.  I spent 60 minutes performing this consultation and > 50% of this time was spent face to face counseling and coordinating the care of this patient's severe, symptomatic bicuspid aortic valve stenosis.  Gaye Pollack, MD [facility] [phone]

## 2022-06-26 NOTE — Pre-Procedure Instructions (Signed)
Surgical Instructions    Your procedure is scheduled on Thursday, November 30th.  Report to [facility] Main Entrance "A" at 5:30 A.M., then check in with the Admitting office.  Call this number if you have problems the morning of surgery:  [phone]   If you have any questions prior to your surgery date call [phone]: Open Monday-Friday 8am-4pm If you experience any cold or flu symptoms such as cough, fever, chills, shortness of breath, etc. between now and your scheduled surgery, please notify us at the above number     Remember:  Do not eat or drink after midnight the night before your surgery   Take these medicines the morning of surgery with A SIP OF WATER:  azithromycin (ZITHROMAX)    Take these medications as needed: acetaminophen (TYLENOL)  Albuterol inhaler-Please bring all inhalers with you the day of surgery.  Ear drops valACYclovir (VALTREX)   As of today, STOP taking any Aspirin (unless otherwise instructed by your surgeon) Aleve, Naproxen, Ibuprofen, Motrin, Advil, Goody's, BC's, all herbal medications, fish oil, and all vitamins.           Do not wear jewelry or makeup. Do not wear lotions, powders, perfumes or deodorant. Do not shave 48 hours prior to surgery.  Do not bring valuables to the hospital. Do not wear nail polish, gel polish, artificial nails, or any other type of covering on natural nails (fingers and toes) If you have artificial nails or gel coating that need to be removed by a nail salon, please have this removed prior to surgery. Artificial nails or gel coating may interfere with anesthesia's ability to adequately monitor your vital signs.  [facility] is not responsible for any belongings or valuables.    Do NOT Smoke (Tobacco/Vaping)  24 hours prior to your procedure  If you use a CPAP at night, you may bring your mask for your overnight stay.   Contacts, glasses, hearing aids, dentures or partials may not be worn into surgery, please  bring cases for these belongings   For patients admitted to the hospital, discharge time will be determined by your treatment team.   Patients discharged the day of surgery will not be allowed to drive home, and someone needs to stay with them for 24 hours.   SURGICAL WAITING ROOM VISITATION Patients having surgery or a procedure may have no more than 2 support people in the waiting area - these visitors may rotate.   Children under the age of 68 must have an adult with them who is not the patient. If the patient needs to stay at the hospital during part of their recovery, the visitor guidelines for inpatient rooms apply. Pre-op nurse will coordinate an appropriate time for 1 support person to accompany patient in pre-op.  This support person may not rotate.   Please refer to [email or web address] for the visitor guidelines for Inpatients (after your surgery is over and you are in a regular room).    Special instructions:    Oral Hygiene is also important to reduce your risk of infection.  Remember - BRUSH YOUR TEETH THE MORNING OF SURGERY WITH YOUR REGULAR TOOTHPASTE   [facility]- Preparing For Surgery  Before surgery, you can play an important role. Because skin is not sterile, your skin needs to be as free of germs as possible. You can reduce the number of germs on your skin by washing with CHG (chlorahexidine gluconate) Soap before surgery.  CHG is an antiseptic cleaner which kills germs  and bonds with the skin to continue killing germs even after washing.     Please do not use if you have an allergy to CHG or antibacterial soaps. If your skin becomes reddened/irritated stop using the CHG.  Do not shave (including legs and underarms) for at least 48 hours prior to first CHG shower. It is OK to shave your face.  Please follow these instructions carefully.     Shower the NIGHT BEFORE SURGERY and the MORNING OF SURGERY with CHG Soap.    If you chose to wash your hair, wash your hair first as usual with your normal shampoo. After you shampoo, rinse your hair and body thoroughly to remove the shampoo.  Then [employer] and genitals (private parts) with your normal soap and rinse thoroughly to remove soap.  After that Use CHG Soap as you would any other liquid soap. You can apply CHG directly to the skin and wash gently with a scrungie or a clean washcloth.   Apply the CHG Soap to your body ONLY FROM THE NECK DOWN.  Do not use on open wounds or open sores. Avoid contact with your eyes, ears, mouth and genitals (private parts). Wash Face and genitals (private parts)  with your normal soap.   Wash thoroughly, paying special attention to the area where your surgery will be performed.  Thoroughly rinse your body with warm water from the neck down.  DO NOT shower/wash with your normal soap after using and rinsing off the CHG Soap.  Pat yourself dry with a CLEAN TOWEL.  Wear CLEAN PAJAMAS to bed the night before surgery  Place CLEAN SHEETS on your bed the night before your surgery  DO NOT SLEEP WITH PETS.   Day of Surgery:  Take a shower with CHG soap. Wear Clean/Comfortable clothing the morning of surgery Do not apply any deodorants/lotions.   Remember to brush your teeth WITH YOUR REGULAR TOOTHPASTE.    If you received a COVID test during your pre-op visit, it is requested that you wear a mask when out in public, stay away from anyone that may not be feeling well, and notify your surgeon if you develop symptoms. If you have been in contact with anyone that has tested positive in the last 10 days, please notify your surgeon.    Please read over the following fact sheets that you were given.

## 2022-06-27 ENCOUNTER — Ambulatory Visit (HOSPITAL_COMMUNITY)
Admission: RE | Admit: 2022-06-27 | Discharge: 2022-06-27 | Disposition: A | Discharge status: Home / Self Care | Payer: Commercial Managed Care - HMO | Source: Ambulatory Visit | Attending: Surgery | Admitting: Surgery

## 2022-06-27 ENCOUNTER — Inpatient Hospital Stay (HOSPITAL_BASED_OUTPATIENT_CLINIC_OR_DEPARTMENT_OTHER)
Admission: RE | Admit: 2022-06-27 | Discharge: 2022-06-27 | Disposition: A | Discharge status: Home / Self Care | Payer: Commercial Managed Care - HMO | Source: Ambulatory Visit | Attending: Surgery | Admitting: Surgery

## 2022-06-27 ENCOUNTER — Encounter (HOSPITAL_COMMUNITY)
Admission: RE | Admit: 2022-06-27 | Discharge: 2022-06-27 | Disposition: A | Discharge status: Home / Self Care | Payer: Commercial Managed Care - HMO | Source: Ambulatory Visit | Attending: Surgery | Admitting: Surgery

## 2022-06-27 ENCOUNTER — Encounter (HOSPITAL_COMMUNITY): Payer: Self-pay

## 2022-06-27 ENCOUNTER — Other Ambulatory Visit: Payer: Self-pay

## 2022-06-27 VITALS — BP 157/93 | HR 74 | Temp 97.5°F | Resp 17 | Ht 69.5 in | Wt 191.5 lb

## 2022-06-27 DIAGNOSIS — I350 Nonrheumatic aortic (valve) stenosis: Secondary | ICD-10-CM

## 2022-06-27 DIAGNOSIS — I493 Ventricular premature depolarization: Secondary | ICD-10-CM | POA: Insufficient documentation

## 2022-06-27 DIAGNOSIS — Z01818 Encounter for other preprocedural examination: Secondary | ICD-10-CM | POA: Insufficient documentation

## 2022-06-27 DIAGNOSIS — Z1152 Encounter for screening for COVID-19: Secondary | ICD-10-CM | POA: Insufficient documentation

## 2022-06-27 DIAGNOSIS — Z01810 Encounter for preprocedural cardiovascular examination: Secondary | ICD-10-CM | POA: Insufficient documentation

## 2022-06-27 HISTORY — DX: Other specified postprocedural states: Z98.890

## 2022-06-27 HISTORY — DX: Other complications of anesthesia, initial encounter: T88.59XA

## 2022-06-27 HISTORY — DX: Other specified postprocedural states: R11.2

## 2022-06-27 HISTORY — DX: Nausea with vomiting, unspecified: R11.2

## 2022-06-27 HISTORY — DX: Anxiety disorder, unspecified: F41.9

## 2022-06-27 LAB — TYPE AND SCREEN
ABO/RH(D): A NEG
Antibody Screen: NEGATIVE

## 2022-06-27 LAB — BLOOD GAS, ARTERIAL
Acid-Base Excess: 0.9 mmol/L (ref 0.0–2.0)
Bicarbonate: 24 mmol/L (ref 20.0–28.0)
Drawn by: 58793
O2 Saturation: 98.9 %
Patient temperature: 37
pCO2 arterial: 33 mmHg (ref 32–48)
pH, Arterial: 7.47 — ABNORMAL HIGH (ref 7.35–7.45)
pO2, Arterial: 135 mmHg — ABNORMAL HIGH (ref 83–108)

## 2022-06-27 LAB — COMPREHENSIVE METABOLIC PANEL
ALT: 18 U/L (ref 0–44)
AST: 20 U/L (ref 15–41)
Albumin: 4.1 g/dL (ref 3.5–5.0)
Alkaline Phosphatase: 56 U/L (ref 38–126)
Anion gap: 12 (ref 5–15)
BUN: 15 mg/dL (ref 6–20)
CO2: 24 mmol/L (ref 22–32)
Calcium: 9.4 mg/dL (ref 8.9–10.3)
Chloride: 102 mmol/L (ref 98–111)
Creatinine, Ser: 0.73 mg/dL (ref 0.44–1.00)
GFR, Estimated: >60 mL/min (ref >60)
Glucose, Bld: 86 mg/dL (ref 70–99)
Potassium: 3.8 mmol/L (ref 3.5–5.1)
Sodium: 138 mmol/L (ref 135–145)
Total Bilirubin: 0.5 mg/dL (ref 0.3–1.2)
Total Protein: 6.8 g/dL (ref 6.5–8.1)

## 2022-06-27 LAB — CBC
HCT: 44.6 % (ref 36.0–46.0)
Hemoglobin: 14.7 g/dL (ref 12.0–15.0)
MCH: 31.1 pg (ref 26.0–34.0)
MCHC: 33 g/dL (ref 30.0–36.0)
MCV: 94.5 fL (ref 80.0–100.0)
Platelets: 316 10*3/uL (ref 150–400)
RBC: 4.72 MIL/uL (ref 3.87–5.11)
RDW: 12.1 % (ref 11.5–15.5)
WBC: 9.2 10*3/uL (ref 4.0–10.5)
nRBC: 0 % (ref 0.0–0.2)

## 2022-06-27 LAB — SURGICAL PCR SCREEN
MRSA, PCR: NEGATIVE
Staphylococcus aureus: NEGATIVE

## 2022-06-27 LAB — URINALYSIS, ROUTINE W REFLEX MICROSCOPIC
Bilirubin Urine: NEGATIVE
Glucose, UA: NEGATIVE mg/dL
Hgb urine dipstick: NEGATIVE
Ketones, ur: NEGATIVE mg/dL
Leukocytes,Ua: NEGATIVE
Nitrite: NEGATIVE
Protein, ur: NEGATIVE mg/dL
Specific Gravity, Urine: 1.01 (ref 1.005–1.030)
pH: 6 (ref 5.0–8.0)

## 2022-06-27 LAB — HEMOGLOBIN A1C
Hgb A1c MFr Bld: 5.6 % (ref 4.8–5.6)
Mean Plasma Glucose: 114 mg/dL

## 2022-06-27 LAB — APTT: aPTT: 27 seconds (ref 24–36)

## 2022-06-27 LAB — PROTIME-INR
INR: 1 (ref 0.8–1.2)
Prothrombin Time: 12.8 seconds (ref 11.4–15.2)

## 2022-06-27 NOTE — Progress Notes (Signed)
PCP - Rochele Raring, MD Cardiologist - Dr. Wilhemina Cash  PPM/ICD - Denies  Chest x-ray - 06/27/22 EKG - 06/27/22 Stress Test - Denies ECHO - 02/02/22 Cardiac Cath -11/08/20   Sleep Study - Denies  DM - Denies  Blood Thinner Instructions: Denies Aspirin Instructions:Denies  ERAS Protcol -No  COVID TEST- 06/27/22   Anesthesia review: Yes cardiac history  Patient denies shortness of breath, fever, cough and chest pain at PAT appointment   All instructions explained to the patient, with a verbal understanding of the material. Patient agrees to go over the instructions while at home for a better understanding. Patient also instructed to wear a mask while in public after being tested for COVID-19. The opportunity to ask questions was provided.

## 2022-06-27 NOTE — Progress Notes (Signed)
Pre op AVR has been completed.   Preliminary results in CV Proc.   Olivia Mcgrath 06/27/2022 10:05 AM

## 2022-06-28 LAB — SARS CORONAVIRUS 2 (TAT 6-24 HRS): SARS Coronavirus 2: NEGATIVE

## 2022-06-28 MED ORDER — TRANEXAMIC ACID (OHS) BOLUS VIA INFUSION
15.0000 mg/kg | INTRAVENOUS | Status: AC
  Administered 2022-06-29: 1303.5 mg via INTRAVENOUS
  Filled 2022-06-28: qty 1304

## 2022-06-28 MED ORDER — CEFAZOLIN SODIUM-DEXTROSE 2-4 GM/100ML-% IV SOLN
2.0000 g | INTRAVENOUS | Status: AC
  Administered 2022-06-29: 2 g via INTRAVENOUS
  Filled 2022-06-28: qty 100

## 2022-06-28 MED ORDER — VANCOMYCIN HCL 1500 MG/300ML IV SOLN
1500.0000 mg | INTRAVENOUS | Status: AC
  Administered 2022-06-29: 1500 mg via INTRAVENOUS
  Filled 2022-06-28: qty 300

## 2022-06-28 MED ORDER — NOREPINEPHRINE 4 MG/250ML-% IV SOLN
0.0000 ug/min | INTRAVENOUS | Status: DC
  Filled 2022-06-28: qty 250

## 2022-06-28 MED ORDER — HEPARIN 30,000 UNITS/1000 ML (OHS) CELLSAVER SOLUTION
Status: DC
  Filled 2022-06-28: qty 1000

## 2022-06-28 MED ORDER — POTASSIUM CHLORIDE 2 MEQ/ML IV SOLN
80.0000 meq | INTRAVENOUS | Status: DC
  Filled 2022-06-28: qty 40

## 2022-06-28 MED ORDER — PLASMA-LYTE A IV SOLN
INTRAVENOUS | Status: DC
  Filled 2022-06-28: qty 2.5

## 2022-06-28 MED ORDER — EPINEPHRINE HCL 5 MG/250ML IV SOLN IN NS
0.0000 ug/min | INTRAVENOUS | Status: DC
  Filled 2022-06-28: qty 250

## 2022-06-28 MED ORDER — MILRINONE LACTATE IN DEXTROSE 20-5 MG/100ML-% IV SOLN
0.3000 ug/kg/min | INTRAVENOUS | Status: DC
  Filled 2022-06-28: qty 100

## 2022-06-28 MED ORDER — INSULIN REGULAR(HUMAN) IN NACL 100-0.9 UT/100ML-% IV SOLN
INTRAVENOUS | Status: AC
  Administered 2022-06-29: 1.9 [IU]/h via INTRAVENOUS
  Filled 2022-06-28: qty 100

## 2022-06-28 MED ORDER — TRANEXAMIC ACID 1000 MG/10ML IV SOLN
1.5000 mg/kg/h | INTRAVENOUS | Status: AC
  Administered 2022-06-29: 1.5 mg/kg/h via INTRAVENOUS
  Filled 2022-06-28: qty 25

## 2022-06-28 MED ORDER — PHENYLEPHRINE HCL-NACL 20-0.9 MG/250ML-% IV SOLN
30.0000 ug/min | INTRAVENOUS | Status: AC
  Administered 2022-06-29: 20 ug/min via INTRAVENOUS
  Filled 2022-06-28: qty 250

## 2022-06-28 MED ORDER — DEXMEDETOMIDINE HCL IN NACL 400 MCG/100ML IV SOLN
0.1000 ug/kg/h | INTRAVENOUS | Status: DC
  Filled 2022-06-28: qty 100

## 2022-06-28 MED ORDER — MANNITOL 20 % IV SOLN
INTRAVENOUS | Status: DC
  Filled 2022-06-28: qty 13

## 2022-06-28 MED ORDER — TRANEXAMIC ACID (OHS) PUMP PRIME SOLUTION
2.0000 mg/kg | INTRAVENOUS | Status: DC
  Filled 2022-06-28: qty 1.74

## 2022-06-28 NOTE — H&P (Signed)
[address]       [place] [address]             [phone]      Cardiothoracic Surgery Admission History and Physical   PCP is Jene Every, MD Referring Provider is Leonie Man, MD       Chief Complaint  Patient presents with   Aortic Stenosis           HPI:   The patient is a 61 year old woman with a history of bicuspid aortic valve with moderate aortic stenosis with a mean gradient of 30 mmHg and July 2021.  She was fairly asymptomatic at that time but was not doing too much activity due to recovering from an ankle fracture requiring ORIF.  She subsequently developed COVID-19 infection in August 2021 and was treated with oxygen in the hospital as well as remdesivir and steroids.  She recovered but then had a second episode of COVID in January 2022.  She subsequently presented in April 2022 with substernal chest pain and had 9 specific EKG changes with an elevated troponin.  There was no evidence of PE on CT scan.  Echocardiogram showed a mean gradient across aortic valve of 44 mmHg with a dimensionless index of 0.24 consistent with severe aortic stenosis.  Aortic valve area was 0.9 cm.  Left ventricular ejection fraction was 60 to 65% with mild LVH and grade 1 diastolic dysfunction.  Right ventricular systolic function was normal.  She underwent cardiac catheterization on 11/08/2020 showing normal coronary anatomy.  The mean gradient across the aortic valve was measured at 30 mmHg with a valve area 1.16 cm.  Left and right heart filling pressures were normal with normal cardiac output.  She subsequently underwent a cardiac MRI on 11/09/2020 that was read as being consistent with acute myocarditis.  Left and right ventricular ejection fractions were normal.  She was treated with colchicine.  Her chest pain and shortness of breath gradually resolved.  She now presents with progressive exertional fatigue and shortness of breath as well as some dizziness without  syncope.  Her symptoms seem to be worse when she is carrying things.  A follow-up echocardiogram on 02/02/2022 shows bicuspid aortic valve with a mean gradient of 42 mmHg.  Ejection fraction is 60 to 65%.   She has a daughter who is a Marine scientist and used to work in the cardiac intensive care unit.  She works as a Copywriter, advertising.     Past Medical History:  Diagnosis Date   Calcific aortic stenosis of bicuspid valve      Most Recent Echo April * Oct 2022: Mod Severe AS - mean gradient between 30 & 44 mmHg (3 echos & 1 cath).   Myocarditis ([facility]) 11/09/2020    Cardiac MRI 11/09/2020: Findings consistent with acute myocarditis involving the basal inferior wall and mid wall along the apical inferolateral wall.           Past Surgical History:  Procedure Laterality Date   ABDOMINAL HYSTERECTOMY   11/2008   Cardiac MRI   11/09/2020    Findings c/w ACUTE MYOCARDITIS - involving basal inferior wall and mid wall along the apical inferolateral wall.  LV function estimated 63%, RV function 57%.  Cardiac output-index 4.8L/min-2.4L/min/m   LAPAROSCOPIC CHOLECYSTECTOMY   05/2016   RIGHT/LEFT HEART CATH AND CORONARY ANGIOGRAPHY N/A 11/08/2020    Procedure: RIGHT/LEFT HEART CATH AND CORONARY ANGIOGRAPHY;  Surgeon: Martinique, Peter M, MD;  Location: [facility]  INVASIVE CV LAB;  Service: Cardiovascular;; NORMAL CORONARY ARTERIES. MOD AS (mean AoV Gradient 30 mmHg, AVA 1.16 cm). RAP ~2 mmHg, RVP-EDP 24/0 mmHg-3 mmHg, PAP-mean 21/6 mmHg- 12 mmHg.  PCWP 7 mmhg. LVP-EDP 122/32 mmHg-7 mmHg.  CO-CI 6.1-3.07.   TRANSTHORACIC ECHOCARDIOGRAM   03/2016    A) Bicuspid AoV - No veg. Mild thickening & Mod Calcification. Mild-Mod AS (mean grad 28 mmHg, peak grad 40.4 mmHg). Mild conc LVH. EF 55-60%. Normal DFxn;; B) 06/2019:; EF 55% to 60%. Nl Diastolic Fxn. AoV Dfficult to visualize -> Peak / Mean grad 43 & 26 mm Hg = MOD AS);; C 11/2019: Stable Mod-Severe AS (mean - peak grad 30-53 mmHg). EF 60-65%, no RWMA. Mild LAD dilation.    TRANSTHORACIC ECHOCARDIOGRAM   10/2020    A) 4/9: EF 60-65%.  Mild LA dil.  No R WMA.  Normal RV.  AoV not well-visualized.  Mod-Severe AS (mean-peak gradients 28.5 mmHg - 49.4 mmHg). Normal CVP. Normal MV. ;; B)  11/08/2020: Normal EF 55 to 60%.  Mild LVH.  GR 1 DD.  Mild LA dilation.  Bicuspid AoV (mod Ca++).  Fusion of non & Left coronary cusps.  ~ Severe AS: AVA estimated 0.9 cm.  Mean gradient 44 mmHg., peak ~71 mmHg.   TRANSTHORACIC ECHOCARDIOGRAM   05/13/2021    Moderate AoV calcification - Mod-Severe AS (Mean Gradient 34 mmHg, peak 54.5 mmHg, AVA 0.7 cm).  LVEF remains 60 to 65%.  No R WMA.  GR 1 DD.  Normal PAP.  Moderate LA dilation.           Family History  Problem Relation Age of Onset   Rheum arthritis Mother          Age 20 (2018)   Non-Hodgkin's lymphoma Mother     Hypertension Mother     Hyperlipidemia Mother     Lung cancer Father     Hypertension Father     Hyperlipidemia Father     Rheum arthritis Brother          10 ((2018   Cancer Maternal Grandmother     Atrial fibrillation Maternal Grandmother     Cancer Paternal Grandmother     Heart disease Paternal Grandmother     Cancer Paternal Grandfather        Social History Social History         Tobacco Use   Smoking status: Never   Smokeless tobacco: Never  Substance Use Topics   Alcohol use: Yes      Alcohol/week: 1.0 standard drink of alcohol      Types: 1 Glasses of wine per week   Drug use: No            Current Outpatient Medications  Medication Sig Dispense Refill   acetaminophen (TYLENOL) 500 MG tablet Take 500 mg by mouth every 6 (six) hours as needed for mild pain, moderate pain or headache.       escitalopram (LEXAPRO) 10 MG tablet Take 5 mg by mouth at bedtime.       estradiol (ESTRACE) 2 MG tablet Take 2 mg by mouth at bedtime.       Fluocinolone Acetonide 0.01 % OIL Place in ear(s) daily.       guaiFENesin (MUCINEX) 600 MG 12 hr tablet Take 600 mg by mouth as needed for cough or to loosen  phlegm.       Multiple Vitamins-Minerals (MULTIVITAMIN WITH MINERALS) tablet Take 1 tablet by mouth at bedtime.  valACYclovir (VALTREX) 1000 MG tablet Take 1,000 mg by mouth 2 (two) times daily.        No current facility-administered medications for this visit.          Allergies  Allergen Reactions   Sulfa Antibiotics Rash      Review of Systems  Constitutional:  Positive for activity change and fatigue. Negative for fever.  HENT: Negative.  Negative for dental problem.   Eyes:        Wears glasses  Respiratory:  Positive for shortness of breath.   Cardiovascular:  Positive for chest pain. Negative for leg swelling.  Gastrointestinal: Negative.   Endocrine: Negative.   Genitourinary: Negative.   Musculoskeletal:  Positive for arthralgias.  Skin: Negative.   Allergic/Immunologic: Negative.   Neurological:  Positive for dizziness. Negative for syncope.  Hematological: Negative.   Psychiatric/Behavioral:  The patient is nervous/anxious.       BP (!) 152/92 (BP Location: Left Arm, Patient Position: Sitting)   Pulse 82   Resp 18   Ht '5\' 9"'$  (1.753 m)   Wt 191 lb (86.6 kg)   SpO2 100%   BMI 28.21 kg/m  Physical Exam Constitutional:      Appearance: Normal appearance.  HENT:     Head: Normocephalic and atraumatic.  Eyes:     Extraocular Movements: Extraocular movements intact.     Conjunctiva/sclera: Conjunctivae normal.     Pupils: Pupils are equal, round, and reactive to light.  Cardiovascular:     Rate and Rhythm: Normal rate and regular rhythm.     Pulses: Normal pulses.     Heart sounds: Murmur heard.     Comments: 3/6 systolic murmur along the right sternal border.  There is no diastolic murmur. Pulmonary:     Effort: Pulmonary effort is normal.     Breath sounds: Normal breath sounds.  Abdominal:     General: There is no distension.     Tenderness: There is no abdominal tenderness.  Musculoskeletal:        General: No swelling. Normal range of motion.      Cervical back: Normal range of motion and neck supple.  Skin:    General: Skin is warm and dry.  Neurological:     General: No focal deficit present.     Mental Status: She is alert and oriented to person, place, and time.  Psychiatric:        Mood and Affect: Mood normal.        Behavior: Behavior normal.        Diagnostic Tests:     ECHOCARDIOGRAM REPORT         Patient Name:   Olivia Mcgrath  Date of Exam: 02/02/2022  Medical Rec #:  [id number]     Height:       69.0 in  Accession #:    [id number]    Weight:       188.0 lb  Date of Birth:  17-Apr-1961     BSA:          2.012 m  Patient Age:    52 years      BP:           138/88 mmHg  Patient Gender: F             HR:           68 bpm.  Exam Location:  [place]   Procedure: 2D Echo, 3D Echo, Cardiac Doppler, Color Doppler and Strain  Analysis   Indications:    Q23.1 Bicuspid aortic valve     History:        Patient has prior history of Echocardiogram examinations,  most                  recent 05/13/2021. Aortic Valve Disease and Bicuspid  aortic                  valve, Signs/Symptoms:Shortness of Breath; Risk                  Factors:Dyslipidemia. Myocarditis. DVT. COVID-19.     Sonographer:    Basilia Jumbo BS, RDCS  Referring Phys: Glenetta Hew, W   IMPRESSIONS     1. Left ventricular ejection fraction, by estimation, is 60 to 65%. The  left ventricle has normal function. The left ventricle has no regional  wall motion abnormalities. Left ventricular diastolic parameters are  indeterminate. The average left  ventricular global longitudinal strain is -22.6 %. The global longitudinal  strain is normal.   2. Right ventricular systolic function is normal. The right ventricular  size is normal. There is mildly elevated pulmonary artery systolic  pressure.   3. Left atrial size was mild to moderately dilated.   4. The mitral valve is normal in structure. Trivial mitral valve  regurgitation. No evidence of  mitral stenosis.   5. The aortic valve is bicuspid. Aortic valve regurgitation is not  visualized. Severe aortic valve stenosis. Aortic valve mean gradient  measures 42.0 mmHg. Aortic valve Vmax measures 4.08 m/s.   6. The inferior vena cava is dilated in size with <50% respiratory  variability, suggesting right atrial pressure of 15 mmHg.   Comparison(s): Changes from prior study are noted. 05/13/21 EF 60-65%.  Moderate-severe AS 21mHg mean PG, 528mg peak PG. Ascending aorta 40 mm.   Conclusion(s)/Recommendation(s): Severe bicuspid aortic valve stenosis by  gradient, peak velocity, and DVI. Ascending aorta not well visualized but  previously with mild dilation.   FINDINGS   Left Ventricle: Left ventricular ejection fraction, by estimation, is 60  to 65%. The left ventricle has normal function. The left ventricle has no  regional wall motion abnormalities. The average left ventricular global  longitudinal strain is -22.6 %.  The global longitudinal strain is normal. The left ventricular internal  cavity size was normal in size. There is no left ventricular hypertrophy.  Left ventricular diastolic parameters are indeterminate.   Right Ventricle: The right ventricular size is normal. Right vetricular  wall thickness was not well visualized. Right ventricular systolic  function is normal. There is mildly elevated pulmonary artery systolic  pressure. The tricuspid regurgitant velocity   is 2.37 m/s, and with an assumed right atrial pressure of 15 mmHg, the  estimated right ventricular systolic pressure is 3723.7mHg.   Left Atrium: Left atrial size was mild to moderately dilated.   Right Atrium: Right atrial size was normal in size.   Pericardium: There is no evidence of pericardial effusion.   Mitral Valve: The mitral valve is normal in structure. Trivial mitral  valve regurgitation. No evidence of mitral valve stenosis.   Tricuspid Valve: The tricuspid valve is normal in  structure. Tricuspid  valve regurgitation is trivial. No evidence of tricuspid stenosis.   Aortic Valve: Fusion of L-N coronary cusps. The aortic valve is bicuspid.  Aortic valve regurgitation is not visualized. Severe aortic stenosis is  present. Aortic valve mean gradient measures 42.0 mmHg. Aortic valve peak  gradient  measures 66.6 mmHg.  Aortic valve area, by VTI measures 1.01 cm.   Pulmonic Valve: The pulmonic valve was not well visualized. Pulmonic valve  regurgitation is trivial. No evidence of pulmonic stenosis.   Aorta: The ascending aorta was not well visualized.   Venous: The inferior vena cava is dilated in size with less than 50%  respiratory variability, suggesting right atrial pressure of 15 mmHg.   IAS/Shunts: The atrial septum is grossly normal.      LEFT VENTRICLE  PLAX 2D  LVIDd:         4.40 cm   Diastology  LVIDs:         3.00 cm   LV e' medial:    8.32 cm/s  LV PW:         1.00 cm   LV E/e' medial:  12.1  LV IVS:        1.20 cm   LV e' lateral:   8.50 cm/s  LVOT diam:     2.40 cm   LV E/e' lateral: 11.9  LV SV:         113  LV SV Index:   56        2D Longitudinal Strain  LVOT Area:     4.52 cm  2D Strain GLS (A2C):   -18.4 %                           2D Strain GLS (A3C):   -26.2 %                           2D Strain GLS (A4C):   -23.1 %                           2D Strain GLS Avg:     -22.6 %                              3D Volume EF:                           3D EF:        58 %                           LV EDV:       135 ml                           LV ESV:       57 ml                           LV SV:        78 ml   RIGHT VENTRICLE             IVC  RV Basal diam:  3.50 cm     IVC diam: 2.20 cm  RV S prime:     11.00 cm/s  TAPSE (M-mode): 2.8 cm  RVSP:           30.5 mmHg   LEFT ATRIUM             Index        RIGHT ATRIUM  Index  LA diam:        4.40 cm 2.19 cm/m   RA Pressure: 8.00 mmHg  LA Vol (A2C):   75.6 ml 37.57 ml/m  RA Area:      14.00 cm  LA Vol (A4C):   68.2 ml 33.89 ml/m  RA Volume:   33.70 ml  16.75 ml/m  LA Biplane Vol: 73.3 ml 36.42 ml/m   AORTIC VALVE  AV Area (Vmax):    1.08 cm  AV Area (Vmean):   0.99 cm  AV Area (VTI):     1.01 cm  AV Vmax:           408.00 cm/s  AV Vmean:          305.000 cm/s  AV VTI:            1.115 m  AV Peak Grad:      66.6 mmHg  AV Mean Grad:      42.0 mmHg  LVOT Vmax:         97.10 cm/s  LVOT Vmean:        66.900 cm/s  LVOT VTI:          0.249 m  LVOT/AV VTI ratio: 0.22     AORTA  Ao Root diam: 3.10 cm   MITRAL VALVE                TRICUSPID VALVE                              TR Peak grad:   22.5 mmHg  MV Decel Time: 239 msec     TR Vmax:        237.00 cm/s  MV E velocity: 101.00 cm/s  Estimated RAP:  8.00 mmHg  MV A velocity: 98.50 cm/s   RVSP:           30.5 mmHg  MV E/A ratio:  1.03                              SHUNTS                              Systemic VTI:  0.25 m                              Systemic Diam: 2.40 cm   Buford Dresser MD  Electronically signed by Buford Dresser MD  Signature Date/Time: 02/02/2022/6:32:27 PM         Final       Physicians   Panel Physicians Referring Physician Case Authorizing Physician  Martinique, Peter M, MD (Primary)        Procedures   RIGHT/LEFT HEART CATH AND CORONARY ANGIOGRAPHY    Conclusion     The left ventricular systolic function is normal. LV end diastolic pressure is normal. The left ventricular ejection fraction is 55-65% by visual estimate. There is moderate aortic valve stenosis.   1. Normal coronary anatomy 2. Moderate Aortic stenosis. Mean gradient 30 mm Hg. AVA 1.16 cm squared with index 0.58 3. Normal LV filling pressures 4. Normal right heart pressures. 5. Normal cardiac output.   Plan; no clear cause for chest pain identified. Medical management.   Recommendations   Antiplatelet/Anticoag Recommend Aspirin '81mg'$  daily for moderate CAD.  Indications   Elevated  troponin [R77.8 (ICD-10-CM)]    Procedural Details   Technical Details Indication: 61 yo WF with history of AS with bicuspid AV presented with chest pain and elevated troponin  Procedural Details: The right wrist was prepped, draped, and anesthetized with 1% lidocaine. Using the modified Seldinger technique a 6 Fr slender sheath was placed in the right radial artery and a 5 French sheath was placed in the right brachial vein. A Swan-Ganz catheter was used for the right heart catheterization. Standard protocol was followed for recording of right heart pressures and sampling of oxygen saturations. Fick cardiac output was calculated. Standard Judkins catheters were used for selective coronary angiography and left ventriculography. There were no immediate procedural complications. The patient was transferred to the post catheterization recovery area for further monitoring. Contrast: 40 cc Estimated blood loss <50 mL.   During this procedure medications were administered to achieve and maintain moderate conscious sedation while the patient's heart rate, blood pressure, and oxygen saturation were continuously monitored and I was present face-to-face 100% of this time.    Medications (Filter: Administrations occurring from 1240 to 1357 on 11/08/20)  important  Continuous medications are totaled by the amount administered until 11/08/20 1357.    midazolam (VERSED) injection (mg) Total dose:  3 mg  Date/Time Rate/Dose/Volume Action    11/08/20 1300 2 mg Given    1323 1 mg Given      fentaNYL (SUBLIMAZE) injection (mcg) Total dose:  50 mcg  Date/Time Rate/Dose/Volume Action    11/08/20 1300 25 mcg Given    1323 25 mcg Given      lidocaine (PF) (XYLOCAINE) 1 % injection (mL) Total volume:  4 mL  Date/Time Rate/Dose/Volume Action    11/08/20 1305 2 mL Given    1321 2 mL Given      Heparin (Porcine) in NaCl 1000-0.9 UT/500ML-% SOLN (mL) Total volume:  1,000 mL  Date/Time Rate/Dose/Volume  Action    11/08/20 1306 500 mL Given    1306 500 mL Given      Radial Cocktail/Verapamil only (mL) Total volume:  10 mL  Date/Time Rate/Dose/Volume Action    11/08/20 1323 10 mL Given      heparin sodium (porcine) injection (Units) Total dose:  4,000 Units  Date/Time Rate/Dose/Volume Action    11/08/20 1343 4,000 Units Given      iohexol (OMNIPAQUE) 350 MG/ML injection (mL) Total volume:  40 mL  Date/Time Rate/Dose/Volume Action    11/08/20 1356 40 mL Given      zolpidem (AMBIEN) tablet 5 mg (mg) Total dose:  Cannot be calculated* Dosing weight:  79.4  *Administration dose not documented Date/Time Rate/Dose/Volume Action    11/08/20 1240 *Not included in total MAR Hold      acetaminophen (TYLENOL) tablet 650 mg (mg) Total dose:  Cannot be calculated* Dosing weight:  79.4  *Administration dose not documented Date/Time Rate/Dose/Volume Action    11/08/20 1240 *Not included in total MAR Hold      aspirin EC tablet 81 mg (mg) Total dose:  Cannot be calculated* Dosing weight:  79.4  *Administration dose not documented Date/Time Rate/Dose/Volume Action    11/08/20 1240 *Not included in total MAR Hold      atorvastatin (LIPITOR) tablet 80 mg (mg) Total dose:  Cannot be calculated* Dosing weight:  79.4  *Administration dose not documented Date/Time Rate/Dose/Volume Action    11/08/20 1240 *Not included in total MAR Hold      escitalopram (LEXAPRO) tablet 5 mg (  mg) Total dose:  Cannot be calculated* Dosing weight:  79.4  *Administration dose not documented Date/Time Rate/Dose/Volume Action    11/08/20 1240 *Not included in total MAR Hold      estradiol (ESTRACE) tablet 2 mg (mg) Total dose:  Cannot be calculated* Dosing weight:  79.4  *Administration dose not documented Date/Time Rate/Dose/Volume Action    11/08/20 1240 *Not included in total MAR Hold      guaiFENesin (MUCINEX) 12 hr tablet 600 mg (mg) Total dose:  Cannot be calculated* Dosing weight:   79.4  *Administration dose not documented Date/Time Rate/Dose/Volume Action    11/08/20 1240 *Not included in total MAR Hold      metoprolol tartrate (LOPRESSOR) tablet 25 mg (mg) Total dose:  Cannot be calculated* Dosing weight:  79.4  *Administration dose not documented Date/Time Rate/Dose/Volume Action    11/08/20 1240 *Not included in total MAR Hold      ondansetron (ZOFRAN) injection 4 mg (mg) Total dose:  Cannot be calculated* Dosing weight:  79.4  *Administration dose not documented Date/Time Rate/Dose/Volume Action    11/08/20 1240 *Not included in total MAR Hold      sodium chloride (OCEAN) 0.65 % nasal spray 1 spray (spray) Total dose:  Cannot be calculated* Dosing weight:  80.8  *Administration dose not documented Date/Time Rate/Dose/Volume Action    11/08/20 1240 *Not included in total MAR Hold      sodium chloride flush (NS) 0.9 % injection 3 mL (mL) Total dose:  Cannot be calculated* Dosing weight:  79.4  *Administration dose not documented Date/Time Rate/Dose/Volume Action    11/08/20 1240 *Not included in total MAR Hold      Sedation Time   Sedation Time Physician-1: 48 minutes 15 seconds Contrast   Medication Name Total Dose  iohexol (OMNIPAQUE) 350 MG/ML injection 40 mL    Radiation/Fluoro   Fluoro time: 5.5 (min) DAP: 8847 (mGycm2) Cumulative Air Kerma: 696 (mGy) Complications      Complications documented before study signed (11/08/2020  2:95 PM)     No complications were associated with this study.  Documented by Martinique, Peter M, MD - 11/08/2020  1:58 PM      Coronary Findings   Diagnostic Dominance: Right Left Main  Vessel was injected. Vessel is normal in caliber. Vessel is angiographically normal.    Left Anterior Descending  Vessel was injected. Vessel is normal in caliber. Vessel is angiographically normal.    Left Circumflex  Vessel was injected. Vessel is normal in caliber. Vessel is angiographically normal.    Right  Coronary Artery  Vessel was injected. Vessel is normal in caliber. Vessel is angiographically normal.    Intervention    No interventions have been documented.    Wall Motion        Resting          All segments of the heart are normal.             Left Heart   Left Ventricle The left ventricular size is normal. The left ventricular systolic function is normal. LV end diastolic pressure is normal. The left ventricular ejection fraction is 55-65% by visual estimate. No regional wall motion abnormalities.  Aortic Valve There is moderate aortic valve stenosis. The aortic valve is calcified.    Coronary Diagrams   Diagnostic Dominance: Right  Intervention   Implants      No implant documentation for this case.    Syngo Images    Show images for CARDIAC CATHETERIZATION Images  on [employer]    Show images for [employer], [place] Link to Procedure Log   Procedure Log    Hemo Data   Flowsheet Row Most Recent Value  Fick Cardiac Output 6.1 L/min  Fick Cardiac Output Index 3.07 (L/min)/BSA  Aortic Mean Gradient 29.11 mmHg  Aortic Peak Gradient 33 mmHg  Aortic Valve Area 1.16  Aortic Value Area Index 0.58 cm2/BSA  RA A Wave 3 mmHg  RA V Wave 2 mmHg  RA Mean 0 mmHg  RV Systolic Pressure 24 mmHg  RV Diastolic Pressure 0 mmHg  RV EDP 3 mmHg  PA Systolic Pressure 21 mmHg  PA Diastolic Pressure 6 mmHg  PA Mean 12 mmHg  PW A Wave 7 mmHg  PW V Wave 7 mmHg  PW Mean 5 mmHg  AO Systolic Pressure 643 mmHg  AO Diastolic Pressure 62 mmHg  AO Mean 85 mmHg  LV Systolic Pressure 329 mmHg  LV Diastolic Pressure 2 mmHg  LV EDP 7 mmHg  AOp Systolic Pressure 518 mmHg  AOp Diastolic Pressure 59 mmHg  AOp Mean Pressure 83 mmHg  LVp Systolic Pressure 841 mmHg  LVp Diastolic Pressure 2 mmHg  LVp EDP Pressure 13 mmHg  QP/QS 1  TPVR Index 3.91 HRUI  TSVR Index 27.71 HRUI  TPVR/TSVR Ratio 0.14    Narrative & Impression  CLINICAL DATA:  61 year old presenting with chest  pain radiating into back. Current history of aortic stenosis due to congenital bicuspid valve.   EXAM: CT ANGIOGRAPHY CHEST, ABDOMEN AND PELVIS (DISSECTION PROTOCOL)   TECHNIQUE: Non-contrast CT of the chest was initially obtained.   Multidetector CT imaging through the chest, abdomen and pelvis was performed using the standard protocol during bolus administration of intravenous contrast. Multiplanar reconstructed images and MIPs were obtained and reviewed to evaluate the vascular anatomy.   CONTRAST:  138m OMNIPAQUE IOHEXOL 350 MG/ML IV.   COMPARISON:  CTA chest for pulmonary embolism earlier same day.   FINDINGS: CTA CHEST FINDINGS   Cardiovascular: No visible calcified plaque on the unenhanced images. Contrast opacification of the systemic arteries is excellent. No evidence of thoracic aortic dissection. 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 NKyla Balzarinenote from earlier today). No visible soft plaque within the thoracic aorta.   Heart size upper normal to slightly enlarged with evidence of LEFT ventricular hypertrophy. Marked thickening of the aortic valve with associated dense calcification. While not done for coronary artery evaluation, there is calcified and noncalcified plaque in the LAD. No visible coronary atherosclerosis elsewhere.   Mediastinum/Nodes: No pathologically enlarged mediastinal, hilar or axillary lymph nodes. No mediastinal masses. Normal-appearing esophagus. Visualized thyroid gland normal in appearance.   Lungs/Pleura: As mentioned on the CT a earlier today, there is a 4 mm perifissural nodule adjacent to the RIGHT major fissure in the RIGHT MIDDLE LOBE. Lung parenchyma otherwise clear apart from dependent atelectasis posteriorly in the lower lobes. Central airways patent without significant bronchial wall thickening. No pleural effusions.    Musculoskeletal: Regional skeleton unremarkable without acute or significant osseous abnormality.   Review of the MIP images confirms the above findings.   CTA ABDOMEN AND PELVIS FINDINGS   VASCULAR   Aorta: Mild atherosclerosis with calcified and noncalcified plaque distally. No evidence of aneurysm or dissection.   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   SMA: Widely patent without atherosclerosis.   Renals: Single renal arteries bilaterally,  widely patent without atherosclerosis.   IMA: Widely patent without atherosclerosis.   Inflow: Widely patent iliofemoral arteries bilaterally. Mild atherosclerosis involving the distal LEFT common iliac artery. No visible atherosclerosis elsewhere.   Veins: Not evaluated.   Review of the MIP images confirms the above findings.   NON-VASCULAR   Hepatobiliary: Liver normal in size and appearance. Surgically absent gallbladder. No unexpected biliary ductal dilation.   Pancreas: Normal in appearance without evidence of mass, ductal dilation, or inflammation.   Spleen: Normal in size and appearance.   Adrenals/Urinary Tract: Normal appearing adrenal glands. Calculi in both kidneys, including 2 calculi in adjacent upper pole calices of the LEFT kidney on the order 2-3 mm, a small 1 2 mm calculus in a mid calyx of the LEFT kidney, a 5 mm calculus in a pole calyx of the LEFT kidney and a small 1-2 mm calculus in a LOWER pole calyx of the RIGHT kidney. No ureteral calculi on either side. No evidence of hydronephrosis. No focal parenchymal abnormality involving either kidney. Normal appearing urinary bladder.   Stomach/Bowel: Stomach normal in appearance for the degree of distention. Mild dilation of a few loops of jejunum in the upper abdomen, with associated mild wall thickening, though there is no edema/inflammation in the surrounding mesenteric fat. Remaining small bowel  normal in appearance. Moderate stool burden throughout the normal appearing colon. Normal appendix in the RIGHT upper pelvis.   Lymphatic: No pathologic lymphadenopathy.   Reproductive: Surgically absent uterus.  No adnexal masses.   Other: None.   Musculoskeletal: Regional skeleton unremarkable without acute or significant osseous abnormality.   Review of the MIP images confirms the above findings.   IMPRESSION: 1. No evidence of thoracic or abdominal aortic dissection. 2. 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 Kyla Balzarine note from earlier today). 3. Solitary calcified and noncalcified plaque in the LAD. 4. 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 5. Bilateral non-obstructing renal calculi. 6. Mild dilation of a few loops of jejunum involving the upper abdomen, with associated mild wall thickening, though there is no edema/inflammation in the surrounding mesenteric fat. This may be due to a localized ileus or enteritis. 7. Aortic Atherosclerosis (ICD10-I70.0), mild.   I telephoned these results at the time of interpretation to Dr. Johnsie Cancel of cardiology 11/06/2020 at 6:54 p.m.     Electronically Signed   By: Evangeline Dakin M.D.   On: 11/06/2020 19:10      Impression:   This 61 year old woman has stage D, severe, symptomatic bicuspid aortic stenosis with NYHA class II symptoms of exertional fatigue and shortness of breath consistent with chronic diastolic congestive heart failure.  She has also been having episodes of chest discomfort and dizziness.  I personally reviewed her 2D echocardiogram, cardiac catheterization, and CTA studies.  She has a heavily calcified bicuspid aortic valve with restricted leaflet mobility.  The mean gradient is 42 mmHg consistent with severe aortic stenosis.  Left  ventricular ejection fraction is normal.  Cardiac catheterization in April 2022 showed no coronary disease.  I do not think that needs to be repeated.  CTA of the chest showed no evidence of aortic aneurysm disease.  I agree that aortic valve replacement is indicated in this patient for relief of her symptoms and to prevent left ventricular dysfunction.  Given her age I think a bioprosthetic valve would be a good choice for  her.  I discussed the alternatives of mechanical and bioprosthetic valves and she would like to avoid being on Coumadin if possible. I discussed the operative procedure with the patient and her daughter including alternatives, benefits and risks; including but not limited to bleeding, blood transfusion, infection, stroke, myocardial infarction, graft failure, heart block requiring a permanent pacemaker, organ dysfunction, and death.  Stuart Piscopo understands and agrees to proceed.    Plan:   Aortic valve replacement using a bioprosthetic valve.      Gaye Pollack, MD [facility] [phone]

## 2022-06-29 ENCOUNTER — Inpatient Hospital Stay (HOSPITAL_COMMUNITY)
Admission: RE | Admit: 2022-06-29 | Discharge: 2022-07-03 | DRG: 220 | Disposition: A | Discharge status: Home / Self Care | Payer: Commercial Managed Care - HMO | Attending: Surgery | Admitting: Surgery

## 2022-06-29 ENCOUNTER — Other Ambulatory Visit: Payer: Self-pay

## 2022-06-29 ENCOUNTER — Inpatient Hospital Stay (HOSPITAL_COMMUNITY): Payer: Commercial Managed Care - HMO | Admitting: Physician Assistant

## 2022-06-29 ENCOUNTER — Encounter (HOSPITAL_COMMUNITY)
Admission: RE | Disposition: A | Discharge status: Home / Self Care | Payer: Self-pay | Source: Home / Self Care | Attending: Surgery

## 2022-06-29 ENCOUNTER — Encounter (HOSPITAL_COMMUNITY): Payer: Self-pay | Admitting: Surgery

## 2022-06-29 ENCOUNTER — Inpatient Hospital Stay (HOSPITAL_COMMUNITY): Payer: Commercial Managed Care - HMO

## 2022-06-29 DIAGNOSIS — G709 Myoneural disorder, unspecified: Secondary | ICD-10-CM | POA: Diagnosis not present

## 2022-06-29 DIAGNOSIS — R42 Dizziness and giddiness: Secondary | ICD-10-CM | POA: Diagnosis not present

## 2022-06-29 DIAGNOSIS — Z8249 Family history of ischemic heart disease and other diseases of the circulatory system: Secondary | ICD-10-CM

## 2022-06-29 DIAGNOSIS — Z79890 Hormone replacement therapy (postmenopausal): Secondary | ICD-10-CM

## 2022-06-29 DIAGNOSIS — I5032 Chronic diastolic (congestive) heart failure: Secondary | ICD-10-CM | POA: Diagnosis present

## 2022-06-29 DIAGNOSIS — D62 Acute posthemorrhagic anemia: Secondary | ICD-10-CM | POA: Diagnosis not present

## 2022-06-29 DIAGNOSIS — Z952 Presence of prosthetic heart valve: Principal | ICD-10-CM

## 2022-06-29 DIAGNOSIS — I350 Nonrheumatic aortic (valve) stenosis: Secondary | ICD-10-CM

## 2022-06-29 DIAGNOSIS — Q230 Congenital stenosis of aortic valve: Secondary | ICD-10-CM | POA: Diagnosis present

## 2022-06-29 DIAGNOSIS — R110 Nausea: Secondary | ICD-10-CM | POA: Diagnosis not present

## 2022-06-29 DIAGNOSIS — T3995XA Adverse effect of unspecified nonopioid analgesic, antipyretic and antirheumatic, initial encounter: Secondary | ICD-10-CM | POA: Diagnosis not present

## 2022-06-29 DIAGNOSIS — Y92230 Patient room in hospital as the place of occurrence of the external cause: Secondary | ICD-10-CM | POA: Diagnosis not present

## 2022-06-29 DIAGNOSIS — F419 Anxiety disorder, unspecified: Secondary | ICD-10-CM

## 2022-06-29 DIAGNOSIS — Z83438 Family history of other disorder of lipoprotein metabolism and other lipidemia: Secondary | ICD-10-CM

## 2022-06-29 DIAGNOSIS — Z79899 Other long term (current) drug therapy: Secondary | ICD-10-CM

## 2022-06-29 DIAGNOSIS — Z8616 Personal history of COVID-19: Secondary | ICD-10-CM | POA: Diagnosis not present

## 2022-06-29 HISTORY — PX: AORTIC VALVE REPLACEMENT: SHX41

## 2022-06-29 HISTORY — PX: TEE WITHOUT CARDIOVERSION: SHX5443

## 2022-06-29 LAB — POCT I-STAT 7, (LYTES, BLD GAS, ICA,H+H)
Acid-Base Excess: 0 mmol/L (ref 0.0–2.0)
Acid-Base Excess: 1 mmol/L (ref 0.0–2.0)
Acid-Base Excess: 1 mmol/L (ref 0.0–2.0)
Acid-base deficit: 3 mmol/L — ABNORMAL HIGH (ref 0.0–2.0)
Acid-base deficit: 7 mmol/L — ABNORMAL HIGH (ref 0.0–2.0)
Acid-base deficit: 4 mmol/L — ABNORMAL HIGH (ref 0.0–2.0)
Acid-base deficit: 4 mmol/L — ABNORMAL HIGH (ref 0.0–2.0)
Acid-base deficit: 3 mmol/L — ABNORMAL HIGH (ref 0.0–2.0)
Bicarbonate: 25.3 mmol/L (ref 20.0–28.0)
Bicarbonate: 25 mmol/L (ref 20.0–28.0)
Bicarbonate: 24.8 mmol/L (ref 20.0–28.0)
Bicarbonate: 21.4 mmol/L (ref 20.0–28.0)
Bicarbonate: 19.7 mmol/L — ABNORMAL LOW (ref 20.0–28.0)
Bicarbonate: 23 mmol/L (ref 20.0–28.0)
Bicarbonate: 22.2 mmol/L (ref 20.0–28.0)
Bicarbonate: 23 mmol/L (ref 20.0–28.0)
Calcium, Ion: 1.23 mmol/L (ref 1.15–1.40)
Calcium, Ion: 1 mmol/L — ABNORMAL LOW (ref 1.15–1.40)
Calcium, Ion: 1.05 mmol/L — ABNORMAL LOW (ref 1.15–1.40)
Calcium, Ion: 1.07 mmol/L — ABNORMAL LOW (ref 1.15–1.40)
Calcium, Ion: 1.08 mmol/L — ABNORMAL LOW (ref 1.15–1.40)
Calcium, Ion: 1.06 mmol/L — ABNORMAL LOW (ref 1.15–1.40)
Calcium, Ion: 1.06 mmol/L — ABNORMAL LOW (ref 1.15–1.40)
Calcium, Ion: 1.05 mmol/L — ABNORMAL LOW (ref 1.15–1.40)
HCT: 40 % (ref 36.0–46.0)
HCT: 28 % — ABNORMAL LOW (ref 36.0–46.0)
HCT: 29 % — ABNORMAL LOW (ref 36.0–46.0)
HCT: 28 % — ABNORMAL LOW (ref 36.0–46.0)
HCT: 36 % (ref 36.0–46.0)
HCT: 37 % (ref 36.0–46.0)
HCT: 35 % — ABNORMAL LOW (ref 36.0–46.0)
HCT: 38 % (ref 36.0–46.0)
Hemoglobin: 13.6 g/dL (ref 12.0–15.0)
Hemoglobin: 9.5 g/dL — ABNORMAL LOW (ref 12.0–15.0)
Hemoglobin: 9.9 g/dL — ABNORMAL LOW (ref 12.0–15.0)
Hemoglobin: 9.5 g/dL — ABNORMAL LOW (ref 12.0–15.0)
Hemoglobin: 12.2 g/dL (ref 12.0–15.0)
Hemoglobin: 12.6 g/dL (ref 12.0–15.0)
Hemoglobin: 11.9 g/dL — ABNORMAL LOW (ref 12.0–15.0)
Hemoglobin: 12.9 g/dL (ref 12.0–15.0)
O2 Saturation: 100 %
O2 Saturation: 100 %
O2 Saturation: 100 %
O2 Saturation: 99 %
O2 Saturation: 97 %
O2 Saturation: 99 %
O2 Saturation: 99 %
O2 Saturation: 98 %
Patient temperature: 36.4
Patient temperature: 36.8
Patient temperature: 36.3
Patient temperature: 36.4
Potassium: 3.9 mmol/L (ref 3.5–5.1)
Potassium: 3.6 mmol/L (ref 3.5–5.1)
Potassium: 4.2 mmol/L (ref 3.5–5.1)
Potassium: 4.1 mmol/L (ref 3.5–5.1)
Potassium: 4 mmol/L (ref 3.5–5.1)
Potassium: 4.4 mmol/L (ref 3.5–5.1)
Potassium: 4.4 mmol/L (ref 3.5–5.1)
Potassium: 4.6 mmol/L (ref 3.5–5.1)
Sodium: 139 mmol/L (ref 135–145)
Sodium: 138 mmol/L (ref 135–145)
Sodium: 136 mmol/L (ref 135–145)
Sodium: 138 mmol/L (ref 135–145)
Sodium: 138 mmol/L (ref 135–145)
Sodium: 139 mmol/L (ref 135–145)
Sodium: 139 mmol/L (ref 135–145)
Sodium: 138 mmol/L (ref 135–145)
TCO2: 27 mmol/L (ref 22–32)
TCO2: 26 mmol/L (ref 22–32)
TCO2: 26 mmol/L (ref 22–32)
TCO2: 22 mmol/L (ref 22–32)
TCO2: 21 mmol/L — ABNORMAL LOW (ref 22–32)
TCO2: 24 mmol/L (ref 22–32)
TCO2: 23 mmol/L (ref 22–32)
TCO2: 24 mmol/L (ref 22–32)
pCO2 arterial: 43.7 mmHg (ref 32–48)
pCO2 arterial: 38 mmHg (ref 32–48)
pCO2 arterial: 36.9 mmHg (ref 32–48)
pCO2 arterial: 34.5 mmHg (ref 32–48)
pCO2 arterial: 41.8 mmHg (ref 32–48)
pCO2 arterial: 45.9 mmHg (ref 32–48)
pCO2 arterial: 40.8 mmHg (ref 32–48)
pCO2 arterial: 41.4 mmHg (ref 32–48)
pH, Arterial: 7.372 (ref 7.35–7.45)
pH, Arterial: 7.426 (ref 7.35–7.45)
pH, Arterial: 7.436 (ref 7.35–7.45)
pH, Arterial: 7.4 (ref 7.35–7.45)
pH, Arterial: 7.277 — ABNORMAL LOW (ref 7.35–7.45)
pH, Arterial: 7.306 — ABNORMAL LOW (ref 7.35–7.45)
pH, Arterial: 7.34 — ABNORMAL LOW (ref 7.35–7.45)
pH, Arterial: 7.35 (ref 7.35–7.45)
pO2, Arterial: 327 mmHg — ABNORMAL HIGH (ref 83–108)
pO2, Arterial: 431 mmHg — ABNORMAL HIGH (ref 83–108)
pO2, Arterial: 426 mmHg — ABNORMAL HIGH (ref 83–108)
pO2, Arterial: 134 mmHg — ABNORMAL HIGH (ref 83–108)
pO2, Arterial: 101 mmHg (ref 83–108)
pO2, Arterial: 173 mmHg — ABNORMAL HIGH (ref 83–108)
pO2, Arterial: 137 mmHg — ABNORMAL HIGH (ref 83–108)
pO2, Arterial: 116 mmHg — ABNORMAL HIGH (ref 83–108)

## 2022-06-29 LAB — POCT I-STAT EG7
Acid-base deficit: 2 mmol/L (ref 0.0–2.0)
Bicarbonate: 22.7 mmol/L (ref 20.0–28.0)
Calcium, Ion: 1.06 mmol/L — ABNORMAL LOW (ref 1.15–1.40)
HCT: 28 % — ABNORMAL LOW (ref 36.0–46.0)
Hemoglobin: 9.5 g/dL — ABNORMAL LOW (ref 12.0–15.0)
O2 Saturation: 90 %
Potassium: 4.1 mmol/L (ref 3.5–5.1)
Sodium: 138 mmol/L (ref 135–145)
TCO2: 24 mmol/L (ref 22–32)
pCO2, Ven: 36.5 mmHg — ABNORMAL LOW (ref 44–60)
pH, Ven: 7.401 (ref 7.25–7.43)
pO2, Ven: 57 mmHg — ABNORMAL HIGH (ref 32–45)

## 2022-06-29 LAB — POCT I-STAT, CHEM 8
BUN: 15 mg/dL (ref 6–20)
BUN: 12 mg/dL (ref 6–20)
BUN: 18 mg/dL (ref 6–20)
BUN: 13 mg/dL (ref 6–20)
BUN: 13 mg/dL (ref 6–20)
Calcium, Ion: 1.22 mmol/L (ref 1.15–1.40)
Calcium, Ion: 1.01 mmol/L — ABNORMAL LOW (ref 1.15–1.40)
Calcium, Ion: 1.13 mmol/L — ABNORMAL LOW (ref 1.15–1.40)
Calcium, Ion: 1.06 mmol/L — ABNORMAL LOW (ref 1.15–1.40)
Calcium, Ion: 0.98 mmol/L — ABNORMAL LOW (ref 1.15–1.40)
Chloride: 104 mmol/L (ref 98–111)
Chloride: 103 mmol/L (ref 98–111)
Chloride: 104 mmol/L (ref 98–111)
Chloride: 105 mmol/L (ref 98–111)
Chloride: 106 mmol/L (ref 98–111)
Creatinine, Ser: 0.5 mg/dL (ref 0.44–1.00)
Creatinine, Ser: 0.3 mg/dL — ABNORMAL LOW (ref 0.44–1.00)
Creatinine, Ser: 0.5 mg/dL (ref 0.44–1.00)
Creatinine, Ser: 0.4 mg/dL — ABNORMAL LOW (ref 0.44–1.00)
Creatinine, Ser: 0.5 mg/dL (ref 0.44–1.00)
Glucose, Bld: 96 mg/dL (ref 70–99)
Glucose, Bld: 104 mg/dL — ABNORMAL HIGH (ref 70–99)
Glucose, Bld: 108 mg/dL — ABNORMAL HIGH (ref 70–99)
Glucose, Bld: 133 mg/dL — ABNORMAL HIGH (ref 70–99)
Glucose, Bld: 130 mg/dL — ABNORMAL HIGH (ref 70–99)
HCT: 40 % (ref 36.0–46.0)
HCT: 29 % — ABNORMAL LOW (ref 36.0–46.0)
HCT: 35 % — ABNORMAL LOW (ref 36.0–46.0)
HCT: 28 % — ABNORMAL LOW (ref 36.0–46.0)
HCT: 28 % — ABNORMAL LOW (ref 36.0–46.0)
Hemoglobin: 13.6 g/dL (ref 12.0–15.0)
Hemoglobin: 11.9 g/dL — ABNORMAL LOW (ref 12.0–15.0)
Hemoglobin: 9.9 g/dL — ABNORMAL LOW (ref 12.0–15.0)
Hemoglobin: 9.5 g/dL — ABNORMAL LOW (ref 12.0–15.0)
Hemoglobin: 9.5 g/dL — ABNORMAL LOW (ref 12.0–15.0)
Potassium: 4 mmol/L (ref 3.5–5.1)
Potassium: 3.6 mmol/L (ref 3.5–5.1)
Potassium: 5.3 mmol/L — ABNORMAL HIGH (ref 3.5–5.1)
Potassium: 4.2 mmol/L (ref 3.5–5.1)
Potassium: 4.2 mmol/L (ref 3.5–5.1)
Sodium: 140 mmol/L (ref 135–145)
Sodium: 139 mmol/L (ref 135–145)
Sodium: 139 mmol/L (ref 135–145)
Sodium: 135 mmol/L (ref 135–145)
Sodium: 138 mmol/L (ref 135–145)
TCO2: 25 mmol/L (ref 22–32)
TCO2: 25 mmol/L (ref 22–32)
TCO2: 26 mmol/L (ref 22–32)
TCO2: 24 mmol/L (ref 22–32)
TCO2: 21 mmol/L — ABNORMAL LOW (ref 22–32)

## 2022-06-29 LAB — GLUCOSE, CAPILLARY
Glucose-Capillary: 126 mg/dL — ABNORMAL HIGH (ref 70–99)
Glucose-Capillary: 134 mg/dL — ABNORMAL HIGH (ref 70–99)
Glucose-Capillary: 127 mg/dL — ABNORMAL HIGH (ref 70–99)
Glucose-Capillary: 154 mg/dL — ABNORMAL HIGH (ref 70–99)
Glucose-Capillary: 139 mg/dL — ABNORMAL HIGH (ref 70–99)
Glucose-Capillary: 164 mg/dL — ABNORMAL HIGH (ref 70–99)
Glucose-Capillary: 146 mg/dL — ABNORMAL HIGH (ref 70–99)
Glucose-Capillary: 154 mg/dL — ABNORMAL HIGH (ref 70–99)
Glucose-Capillary: 141 mg/dL — ABNORMAL HIGH (ref 70–99)

## 2022-06-29 LAB — HEMOGLOBIN AND HEMATOCRIT, BLOOD
HCT: 27.9 % — ABNORMAL LOW (ref 36.0–46.0)
Hemoglobin: 9.5 g/dL — ABNORMAL LOW (ref 12.0–15.0)

## 2022-06-29 LAB — CBC
HCT: 36.8 % (ref 36.0–46.0)
HCT: 37.9 % (ref 36.0–46.0)
Hemoglobin: 12.1 g/dL (ref 12.0–15.0)
Hemoglobin: 13 g/dL (ref 12.0–15.0)
MCH: 31.3 pg (ref 26.0–34.0)
MCH: 31.7 pg (ref 26.0–34.0)
MCHC: 32.9 g/dL (ref 30.0–36.0)
MCHC: 34.3 g/dL (ref 30.0–36.0)
MCV: 95.1 fL (ref 80.0–100.0)
MCV: 92.4 fL (ref 80.0–100.0)
Platelets: 194 10*3/uL (ref 150–400)
Platelets: 183 10*3/uL (ref 150–400)
RBC: 3.87 MIL/uL (ref 3.87–5.11)
RBC: 4.1 MIL/uL (ref 3.87–5.11)
RDW: 12.3 % (ref 11.5–15.5)
RDW: 12.2 % (ref 11.5–15.5)
WBC: 19.6 10*3/uL — ABNORMAL HIGH (ref 4.0–10.5)
WBC: 16.5 10*3/uL — ABNORMAL HIGH (ref 4.0–10.5)
nRBC: 0 % (ref 0.0–0.2)
nRBC: 0 % (ref 0.0–0.2)

## 2022-06-29 LAB — APTT: aPTT: 35 seconds (ref 24–36)

## 2022-06-29 LAB — BASIC METABOLIC PANEL
Anion gap: 6 (ref 5–15)
BUN: 9 mg/dL (ref 6–20)
CO2: 24 mmol/L (ref 22–32)
Calcium: 7.2 mg/dL — ABNORMAL LOW (ref 8.9–10.3)
Chloride: 108 mmol/L (ref 98–111)
Creatinine, Ser: 0.52 mg/dL (ref 0.44–1.00)
GFR, Estimated: >60 mL/min (ref >60)
Glucose, Bld: 152 mg/dL — ABNORMAL HIGH (ref 70–99)
Potassium: 4.4 mmol/L (ref 3.5–5.1)
Sodium: 138 mmol/L (ref 135–145)

## 2022-06-29 LAB — PLATELET COUNT: Platelets: 186 10*3/uL (ref 150–400)

## 2022-06-29 LAB — PROTIME-INR
INR: 1.5 — ABNORMAL HIGH (ref 0.8–1.2)
Prothrombin Time: 17.8 seconds — ABNORMAL HIGH (ref 11.4–15.2)

## 2022-06-29 LAB — ABO/RH: ABO/RH(D): A NEG

## 2022-06-29 LAB — MAGNESIUM: Magnesium: 3.4 mg/dL — ABNORMAL HIGH (ref 1.7–2.4)

## 2022-06-29 SURGERY — REPLACEMENT, AORTIC VALVE, OPEN
Anesthesia: General | Site: Chest

## 2022-06-29 MED ORDER — BISACODYL 5 MG PO TBEC
10.0000 mg | DELAYED_RELEASE_TABLET | Freq: Every day | ORAL | Status: DC
  Administered 2022-06-30 – 2022-07-03 (×3): 10 mg via ORAL
  Filled 2022-06-29 (×4): qty 2

## 2022-06-29 MED ORDER — DEXMEDETOMIDINE HCL IN NACL 400 MCG/100ML IV SOLN: 0.0000 ug/kg/h | INTRAVENOUS | Status: DC

## 2022-06-29 MED ORDER — SODIUM CHLORIDE 0.9% FLUSH
3.0000 mL | Freq: Two times a day (BID) | INTRAVENOUS | Status: DC
  Administered 2022-06-30 – 2022-07-03 (×5): 3 mL via INTRAVENOUS

## 2022-06-29 MED ORDER — ALBUMIN HUMAN 5 % IV SOLN
250.0000 mL | INTRAVENOUS | Status: DC | PRN
  Administered 2022-06-29 (×2): 12.5 g via INTRAVENOUS

## 2022-06-29 MED ORDER — NITROGLYCERIN IN D5W 200-5 MCG/ML-% IV SOLN
0.0000 ug/min | INTRAVENOUS | Status: DC
  Administered 2022-06-29: 5 ug/min via INTRAVENOUS
  Filled 2022-06-29: qty 250

## 2022-06-29 MED ORDER — LACTATED RINGERS IV SOLN: INTRAVENOUS | Status: DC

## 2022-06-29 MED ORDER — LACTATED RINGERS IV SOLN: INTRAVENOUS | Status: DC | PRN

## 2022-06-29 MED ORDER — 0.9 % SODIUM CHLORIDE (POUR BTL) OPTIME
TOPICAL | Status: DC | PRN
  Administered 2022-06-29: 5000 mL

## 2022-06-29 MED ORDER — CLEVIDIPINE BUTYRATE 0.5 MG/ML IV EMUL
INTRAVENOUS | Status: DC | PRN
  Administered 2022-06-29: 8 mg/h via INTRAVENOUS

## 2022-06-29 MED ORDER — ACETAMINOPHEN 160 MG/5ML PO SOLN: 1000.0000 mg | Freq: Four times a day (QID) | ORAL | Status: DC

## 2022-06-29 MED ORDER — ONDANSETRON HCL 4 MG/2ML IJ SOLN
INTRAMUSCULAR | Status: DC | PRN
  Administered 2022-06-29: 4 mg via INTRAVENOUS

## 2022-06-29 MED ORDER — DEXTROSE 50 % IV SOLN: 0.0000 mL | INTRAVENOUS | Status: DC | PRN

## 2022-06-29 MED ORDER — METOPROLOL TARTRATE 12.5 MG HALF TABLET
12.5000 mg | ORAL_TABLET | Freq: Two times a day (BID) | ORAL | Status: DC
  Administered 2022-06-29: 12.5 mg via ORAL
  Filled 2022-06-29: qty 1

## 2022-06-29 MED ORDER — FENTANYL CITRATE (PF) 250 MCG/5ML IJ SOLN
INTRAMUSCULAR | Status: AC
  Filled 2022-06-29: qty 5

## 2022-06-29 MED ORDER — PROPOFOL 500 MG/50ML IV EMUL
INTRAVENOUS | Status: DC | PRN
  Administered 2022-06-29: 50 ug/kg/min via INTRAVENOUS
  Administered 2022-06-29 (×3): 125 ug/kg/min via INTRAVENOUS

## 2022-06-29 MED ORDER — INSULIN REGULAR(HUMAN) IN NACL 100-0.9 UT/100ML-% IV SOLN: INTRAVENOUS | Status: DC

## 2022-06-29 MED ORDER — MAGNESIUM SULFATE 4 GM/100ML IV SOLN
4.0000 g | Freq: Once | INTRAVENOUS | Status: AC
  Administered 2022-06-29: 4 g via INTRAVENOUS
  Filled 2022-06-29: qty 100

## 2022-06-29 MED ORDER — DEXAMETHASONE SODIUM PHOSPHATE 10 MG/ML IJ SOLN
INTRAMUSCULAR | Status: DC | PRN
  Administered 2022-06-29: 10 mg via INTRAVENOUS

## 2022-06-29 MED ORDER — PHENYLEPHRINE 80 MCG/ML (10ML) SYRINGE FOR IV PUSH (FOR BLOOD PRESSURE SUPPORT)
PREFILLED_SYRINGE | INTRAVENOUS | Status: DC | PRN
  Administered 2022-06-29: 80 ug via INTRAVENOUS
  Administered 2022-06-29 (×2): 40 ug via INTRAVENOUS

## 2022-06-29 MED ORDER — PROPOFOL 10 MG/ML IV BOLUS
INTRAVENOUS | Status: AC
  Filled 2022-06-29: qty 20

## 2022-06-29 MED ORDER — TRAMADOL HCL 50 MG PO TABS
50.0000 mg | ORAL_TABLET | ORAL | Status: DC | PRN
  Administered 2022-06-30 (×2): 100 mg via ORAL
  Administered 2022-07-01: 50 mg via ORAL
  Filled 2022-06-29: qty 2
  Filled 2022-06-29: qty 1
  Filled 2022-06-29: qty 2

## 2022-06-29 MED ORDER — ORAL CARE MOUTH RINSE: 15.0000 mL | OROMUCOSAL | Status: DC | PRN

## 2022-06-29 MED ORDER — CHLORHEXIDINE GLUCONATE 0.12 % MT SOLN
OROMUCOSAL | Status: AC
  Administered 2022-06-29: 15 mL
  Filled 2022-06-29: qty 15

## 2022-06-29 MED ORDER — FAMOTIDINE IN NACL 20-0.9 MG/50ML-% IV SOLN
20.0000 mg | Freq: Two times a day (BID) | INTRAVENOUS | Status: DC
  Administered 2022-06-29: 20 mg via INTRAVENOUS
  Filled 2022-06-29 (×2): qty 50

## 2022-06-29 MED ORDER — VANCOMYCIN HCL IN DEXTROSE 1-5 GM/200ML-% IV SOLN
1000.0000 mg | Freq: Once | INTRAVENOUS | Status: AC
  Administered 2022-06-29: 1000 mg via INTRAVENOUS
  Filled 2022-06-29: qty 200

## 2022-06-29 MED ORDER — ASPIRIN 325 MG PO TBEC
325.0000 mg | DELAYED_RELEASE_TABLET | Freq: Every day | ORAL | Status: DC
  Administered 2022-06-30 – 2022-07-03 (×4): 325 mg via ORAL
  Filled 2022-06-29 (×4): qty 1

## 2022-06-29 MED ORDER — OXYCODONE HCL 5 MG PO TABS
5.0000 mg | ORAL_TABLET | ORAL | Status: DC | PRN
  Administered 2022-06-29 – 2022-06-30 (×3): 10 mg via ORAL
  Filled 2022-06-29 (×3): qty 2

## 2022-06-29 MED ORDER — PROTAMINE SULFATE 10 MG/ML IV SOLN
INTRAVENOUS | Status: DC | PRN
  Administered 2022-06-29: 20 mg via INTRAVENOUS
  Administered 2022-06-29: 280 mg via INTRAVENOUS

## 2022-06-29 MED ORDER — FENTANYL CITRATE (PF) 250 MCG/5ML IJ SOLN
INTRAMUSCULAR | Status: DC | PRN
  Administered 2022-06-29 (×2): 150 ug via INTRAVENOUS
  Administered 2022-06-29: 50 ug via INTRAVENOUS
  Administered 2022-06-29: 100 ug via INTRAVENOUS
  Administered 2022-06-29: 150 ug via INTRAVENOUS
  Administered 2022-06-29 (×2): 100 ug via INTRAVENOUS
  Administered 2022-06-29: 50 ug via INTRAVENOUS
  Administered 2022-06-29: 150 ug via INTRAVENOUS
  Administered 2022-06-29: 100 ug via INTRAVENOUS

## 2022-06-29 MED ORDER — PHENYLEPHRINE HCL-NACL 20-0.9 MG/250ML-% IV SOLN: 0.0000 ug/min | INTRAVENOUS | Status: DC

## 2022-06-29 MED ORDER — MIDAZOLAM HCL (PF) 5 MG/ML IJ SOLN
INTRAMUSCULAR | Status: DC | PRN
  Administered 2022-06-29 (×2): 2 mg via INTRAVENOUS
  Administered 2022-06-29: 1 mg via INTRAVENOUS

## 2022-06-29 MED ORDER — SODIUM CHLORIDE 0.9 % IV SOLN: 250.0000 mL | INTRAVENOUS | Status: DC

## 2022-06-29 MED ORDER — ONDANSETRON HCL 4 MG/2ML IJ SOLN
4.0000 mg | Freq: Four times a day (QID) | INTRAMUSCULAR | Status: DC | PRN
  Administered 2022-06-29 – 2022-07-02 (×5): 4 mg via INTRAVENOUS
  Filled 2022-06-29 (×6): qty 2

## 2022-06-29 MED ORDER — ACETAMINOPHEN 500 MG PO TABS
1000.0000 mg | ORAL_TABLET | Freq: Four times a day (QID) | ORAL | Status: DC
  Administered 2022-06-29 – 2022-07-03 (×13): 1000 mg via ORAL
  Filled 2022-06-29 (×14): qty 2

## 2022-06-29 MED ORDER — ASPIRIN 81 MG PO CHEW: 324.0000 mg | CHEWABLE_TABLET | Freq: Every day | ORAL | Status: DC

## 2022-06-29 MED ORDER — MORPHINE SULFATE (PF) 2 MG/ML IV SOLN
1.0000 mg | INTRAVENOUS | Status: DC | PRN
  Administered 2022-06-29 – 2022-06-30 (×4): 2 mg via INTRAVENOUS
  Filled 2022-06-29 (×4): qty 1

## 2022-06-29 MED ORDER — SODIUM CHLORIDE 0.9% FLUSH: 3.0000 mL | INTRAVENOUS | Status: DC | PRN

## 2022-06-29 MED ORDER — CEFAZOLIN SODIUM-DEXTROSE 2-4 GM/100ML-% IV SOLN
2.0000 g | Freq: Three times a day (TID) | INTRAVENOUS | Status: AC
  Administered 2022-06-29 – 2022-07-01 (×6): 2 g via INTRAVENOUS
  Filled 2022-06-29 (×6): qty 100

## 2022-06-29 MED ORDER — HEMOSTATIC AGENTS (NO CHARGE) OPTIME
TOPICAL | Status: DC | PRN
  Administered 2022-06-29: 1 via TOPICAL

## 2022-06-29 MED ORDER — METOCLOPRAMIDE HCL 5 MG/ML IJ SOLN
10.0000 mg | Freq: Four times a day (QID) | INTRAMUSCULAR | Status: AC
  Administered 2022-06-29 – 2022-06-30 (×4): 10 mg via INTRAVENOUS
  Filled 2022-06-29 (×4): qty 2

## 2022-06-29 MED ORDER — PROTAMINE SULFATE 10 MG/ML IV SOLN
INTRAVENOUS | Status: AC
  Filled 2022-06-29: qty 25

## 2022-06-29 MED ORDER — HEPARIN SODIUM (PORCINE) 1000 UNIT/ML IJ SOLN
INTRAMUSCULAR | Status: DC | PRN
  Administered 2022-06-29: 30000 [IU] via INTRAVENOUS

## 2022-06-29 MED ORDER — LACTATED RINGERS IV SOLN: 500.0000 mL | Freq: Once | INTRAVENOUS | Status: DC | PRN

## 2022-06-29 MED ORDER — MIDAZOLAM HCL (PF) 10 MG/2ML IJ SOLN
INTRAMUSCULAR | Status: AC
  Filled 2022-06-29: qty 2

## 2022-06-29 MED ORDER — METOPROLOL TARTRATE 5 MG/5ML IV SOLN: 2.5000 mg | INTRAVENOUS | Status: DC | PRN

## 2022-06-29 MED ORDER — KETAMINE HCL 50 MG/5ML IJ SOSY
PREFILLED_SYRINGE | INTRAMUSCULAR | Status: AC
  Filled 2022-06-29: qty 5

## 2022-06-29 MED ORDER — CHLORHEXIDINE GLUCONATE 0.12 % MT SOLN
15.0000 mL | OROMUCOSAL | Status: AC
  Administered 2022-06-29: 15 mL via OROMUCOSAL
  Filled 2022-06-29: qty 15

## 2022-06-29 MED ORDER — CHLORHEXIDINE GLUCONATE CLOTH 2 % EX PADS
6.0000 | MEDICATED_PAD | Freq: Every day | CUTANEOUS | Status: DC
  Administered 2022-06-29 – 2022-07-03 (×5): 6 via TOPICAL

## 2022-06-29 MED ORDER — ACETAMINOPHEN 10 MG/ML IV SOLN
INTRAVENOUS | Status: DC | PRN
  Administered 2022-06-29: 1000 mg via INTRAVENOUS

## 2022-06-29 MED ORDER — ROCURONIUM BROMIDE 10 MG/ML (PF) SYRINGE
PREFILLED_SYRINGE | INTRAVENOUS | Status: DC | PRN
  Administered 2022-06-29: 80 mg via INTRAVENOUS
  Administered 2022-06-29: 20 mg via INTRAVENOUS
  Administered 2022-06-29: 40 mg via INTRAVENOUS

## 2022-06-29 MED ORDER — SODIUM CHLORIDE (PF) 0.9 % IJ SOLN: OROMUCOSAL | Status: DC | PRN

## 2022-06-29 MED ORDER — DEXAMETHASONE SODIUM PHOSPHATE 10 MG/ML IJ SOLN
INTRAMUSCULAR | Status: AC
  Filled 2022-06-29: qty 1

## 2022-06-29 MED ORDER — PROTAMINE SULFATE 10 MG/ML IV SOLN
INTRAVENOUS | Status: AC
  Filled 2022-06-29: qty 5

## 2022-06-29 MED ORDER — ACETAMINOPHEN 650 MG RE SUPP: 650.0000 mg | Freq: Once | RECTAL | Status: DC

## 2022-06-29 MED ORDER — SODIUM CHLORIDE 0.9 % IV SOLN: INTRAVENOUS | Status: DC

## 2022-06-29 MED ORDER — DOCUSATE SODIUM 100 MG PO CAPS
200.0000 mg | ORAL_CAPSULE | Freq: Every day | ORAL | Status: DC
  Administered 2022-06-30 – 2022-07-03 (×3): 200 mg via ORAL
  Filled 2022-06-29 (×4): qty 2

## 2022-06-29 MED ORDER — THROMBIN (RECOMBINANT) 20000 UNITS EX SOLR
CUTANEOUS | Status: AC
  Filled 2022-06-29: qty 20000

## 2022-06-29 MED ORDER — PANTOPRAZOLE SODIUM 40 MG PO TBEC
40.0000 mg | DELAYED_RELEASE_TABLET | Freq: Every day | ORAL | Status: DC
  Administered 2022-07-01 – 2022-07-03 (×3): 40 mg via ORAL
  Filled 2022-06-29 (×3): qty 1

## 2022-06-29 MED ORDER — SODIUM CHLORIDE 0.9 % IV SOLN: INTRAVENOUS | Status: DC | PRN

## 2022-06-29 MED ORDER — METOPROLOL TARTRATE 25 MG/10 ML ORAL SUSPENSION: 12.5000 mg | Freq: Two times a day (BID) | ORAL | Status: DC

## 2022-06-29 MED ORDER — SODIUM CHLORIDE 0.45 % IV SOLN: INTRAVENOUS | Status: DC | PRN

## 2022-06-29 MED ORDER — ACETAMINOPHEN 160 MG/5ML PO SOLN: 650.0000 mg | Freq: Once | ORAL | Status: DC

## 2022-06-29 MED ORDER — BISACODYL 10 MG RE SUPP: 10.0000 mg | Freq: Every day | RECTAL | Status: DC

## 2022-06-29 MED ORDER — PROPOFOL 1000 MG/100ML IV EMUL
INTRAVENOUS | Status: AC
  Filled 2022-06-29: qty 600

## 2022-06-29 MED ORDER — POTASSIUM CHLORIDE 10 MEQ/50ML IV SOLN: 10.0000 meq | INTRAVENOUS | Status: AC

## 2022-06-29 MED ORDER — KETAMINE HCL 10 MG/ML IJ SOLN
INTRAMUSCULAR | Status: DC | PRN
  Administered 2022-06-29 (×3): 10 mg via INTRAVENOUS
  Administered 2022-06-29: 40 mg via INTRAVENOUS

## 2022-06-29 MED ORDER — METOPROLOL TARTRATE 5 MG/5ML IV SOLN
INTRAVENOUS | Status: DC | PRN
  Administered 2022-06-29 (×3): 1 mg via INTRAVENOUS
  Administered 2022-06-29: 2 mg via INTRAVENOUS

## 2022-06-29 MED ORDER — ACETAMINOPHEN 10 MG/ML IV SOLN
INTRAVENOUS | Status: AC
  Filled 2022-06-29: qty 100

## 2022-06-29 MED ORDER — ESTRADIOL 0.5 MG PO TABS
2.0000 mg | ORAL_TABLET | Freq: Every day | ORAL | Status: DC
  Administered 2022-06-30 – 2022-07-02 (×3): 2 mg via ORAL
  Filled 2022-06-29 (×4): qty 4

## 2022-06-29 MED ORDER — MIDAZOLAM HCL 2 MG/2ML IJ SOLN: 2.0000 mg | INTRAMUSCULAR | Status: DC | PRN

## 2022-06-29 MED ORDER — PROPOFOL 10 MG/ML IV BOLUS
INTRAVENOUS | Status: DC | PRN
  Administered 2022-06-29 (×2): 50 mg via INTRAVENOUS
  Administered 2022-06-29 (×2): 40 mg via INTRAVENOUS
  Administered 2022-06-29: 60 mg via INTRAVENOUS

## 2022-06-29 MED ORDER — SODIUM BICARBONATE 8.4 % IV SOLN
100.0000 meq | Freq: Once | INTRAVENOUS | Status: AC
  Administered 2022-06-29: 100 meq via INTRAVENOUS

## 2022-06-29 MED ORDER — PLASMA-LYTE A IV SOLN: INTRAVENOUS | Status: DC | PRN

## 2022-06-29 MED ORDER — THROMBIN (RECOMBINANT) 20000 UNITS EX SOLR
CUTANEOUS | Status: DC | PRN
  Administered 2022-06-29: 20000 [IU] via TOPICAL

## 2022-06-29 MED ORDER — METOPROLOL TARTRATE 12.5 MG HALF TABLET
ORAL_TABLET | ORAL | Status: AC
  Filled 2022-06-29: qty 1

## 2022-06-29 MED ORDER — PHENYLEPHRINE 80 MCG/ML (10ML) SYRINGE FOR IV PUSH (FOR BLOOD PRESSURE SUPPORT)
PREFILLED_SYRINGE | INTRAVENOUS | Status: AC
  Filled 2022-06-29: qty 10

## 2022-06-29 MED ORDER — ONDANSETRON HCL 4 MG/2ML IJ SOLN
INTRAMUSCULAR | Status: AC
  Filled 2022-06-29: qty 2

## 2022-06-29 SURGICAL SUPPLY — 83 items
ADAPTER CARDIO PERF ANTE/RETRO (ADAPTER) ×2 IMPLANT
BAG DECANTER FOR FLEXI CONT (MISCELLANEOUS) ×2 IMPLANT
BLADE CLIPPER SURG (BLADE) ×2 IMPLANT
BLADE STERNUM SYSTEM 6 (BLADE) ×2 IMPLANT
BLADE SURG 15 STRL LF DISP TIS (BLADE) ×2 IMPLANT
BLADE SURG 15 STRL SS (BLADE) ×4
CABLE/EXTENSION OR 1X16 61 (CABLE) IMPLANT
CANISTER SUCT 3000ML PPV (MISCELLANEOUS) ×2 IMPLANT
CANNULA AORTIC ROOT 9FR (CANNULA) IMPLANT
CANNULA ARTERIAL VENT 3/8 20FR (CANNULA) IMPLANT
CANNULA GUNDRY RCSP 15FR (MISCELLANEOUS) ×2 IMPLANT
CANNULA MC2 2 STG 36/46 NON-V (CANNULA) IMPLANT
CANNULA VENOUS 2 STG 34/46 (CANNULA) ×2
CATH HEART VENT LEFT (CATHETERS) ×2 IMPLANT
CATH ROBINSON RED A/P 18FR (CATHETERS) ×6 IMPLANT
CATH THORACIC 36FR (CATHETERS) ×2 IMPLANT
CATH THORACIC 36FR RT ANG (CATHETERS) ×2 IMPLANT
CNTNR URN SCR LID CUP LEK RST (MISCELLANEOUS) ×2 IMPLANT
CONT SPEC 4OZ STRL OR WHT (MISCELLANEOUS) ×4
CONTAINER PROTECT SURGISLUSH (MISCELLANEOUS) ×4 IMPLANT
COVER SURGICAL LIGHT HANDLE (MISCELLANEOUS) ×2 IMPLANT
DEVICE SUT CK QUICK LOAD MINI (Prosthesis & Implant Heart) IMPLANT
DRAPE CARDIOVASCULAR INCISE (DRAPES) ×2
DRAPE SRG 135X102X78XABS (DRAPES) ×2 IMPLANT
DRAPE WARM FLUID 44X44 (DRAPES) ×2 IMPLANT
DRSG COVADERM 4X14 (GAUZE/BANDAGES/DRESSINGS) ×2 IMPLANT
ELECT CAUTERY BLADE 6.4 (BLADE) ×2 IMPLANT
ELECT REM PT RETURN 9FT ADLT (ELECTROSURGICAL) ×4
ELECTRODE REM PT RTRN 9FT ADLT (ELECTROSURGICAL) ×4 IMPLANT
FELT TEFLON 1X6 (MISCELLANEOUS) ×4 IMPLANT
GAUZE 4X4 16PLY ~~LOC~~+RFID DBL (SPONGE) ×2 IMPLANT
GAUZE SPONGE 4X4 12PLY STRL (GAUZE/BANDAGES/DRESSINGS) ×2 IMPLANT
GLOVE BIO SURGEON STRL SZ 6 (GLOVE) IMPLANT
GLOVE BIO SURGEON STRL SZ 6.5 (GLOVE) IMPLANT
GLOVE BIO SURGEON STRL SZ7 (GLOVE) IMPLANT
GLOVE BIO SURGEON STRL SZ7.5 (GLOVE) IMPLANT
GLOVE BIOGEL PI IND STRL 6.5 (GLOVE) IMPLANT
GLOVE BIOGEL PI IND STRL 7.0 (GLOVE) IMPLANT
GLOVE SS BIOGEL STRL SZ 7.5 (GLOVE) IMPLANT
GLOVE SURG MICRO LTX SZ7 (GLOVE) ×4 IMPLANT
GOWN STRL REUS W/ TWL LRG LVL3 (GOWN DISPOSABLE) ×8 IMPLANT
GOWN STRL REUS W/ TWL XL LVL3 (GOWN DISPOSABLE) ×2 IMPLANT
GOWN STRL REUS W/TWL LRG LVL3 (GOWN DISPOSABLE) ×10
GOWN STRL REUS W/TWL XL LVL3 (GOWN DISPOSABLE) ×4
HEMOSTAT POWDER SURGIFOAM 1G (HEMOSTASIS) ×6 IMPLANT
HEMOSTAT SURGICEL 2X14 (HEMOSTASIS) ×2 IMPLANT
KIT BASIN OR (CUSTOM PROCEDURE TRAY) ×2 IMPLANT
KIT CATH CPB BARTLE (MISCELLANEOUS) ×2 IMPLANT
KIT SUCTION CATH 14FR (SUCTIONS) ×2 IMPLANT
KIT SUT CK MINI COMBO 4X17 (Prosthesis & Implant Heart) IMPLANT
KIT TURNOVER KIT B (KITS) ×2 IMPLANT
LINE VENT (MISCELLANEOUS) IMPLANT
NS IRRIG 1000ML POUR BTL (IV SOLUTION) ×12 IMPLANT
PACK E OPEN HEART (SUTURE) ×2 IMPLANT
PACK OPEN HEART (CUSTOM PROCEDURE TRAY) ×2 IMPLANT
PAD ARMBOARD 7.5X6 YLW CONV (MISCELLANEOUS) ×4 IMPLANT
POSITIONER HEAD DONUT 9IN (MISCELLANEOUS) ×2 IMPLANT
SET MPS 3-ND DEL (MISCELLANEOUS) IMPLANT
SPONGE T-LAP 18X18 ~~LOC~~+RFID (SPONGE) ×8 IMPLANT
SPONGE T-LAP 4X18 ~~LOC~~+RFID (SPONGE) ×2 IMPLANT
SUT BONE WAX W31G (SUTURE) ×2 IMPLANT
SUT EB EXC GRN/WHT 2-0 V-5 (SUTURE) ×4 IMPLANT
SUT ETHIBON EXCEL 2-0 V-5 (SUTURE) IMPLANT
SUT ETHIBOND V-5 VALVE (SUTURE) IMPLANT
SUT PROLENE 3 0 SH DA (SUTURE) IMPLANT
SUT PROLENE 3 0 SH1 36 (SUTURE) ×2 IMPLANT
SUT PROLENE 4 0 RB 1 (SUTURE) ×8
SUT PROLENE 4-0 RB1 .5 CRCL 36 (SUTURE) ×6 IMPLANT
SUT SILK 2 0 SH CR/8 (SUTURE) IMPLANT
SUT STEEL 6MS V (SUTURE) IMPLANT
SUT VIC AB 1 CTX 36 (SUTURE) ×6
SUT VIC AB 1 CTX36XBRD ANBCTR (SUTURE) ×4 IMPLANT
SYSTEM SAHARA CHEST DRAIN ATS (WOUND CARE) ×2 IMPLANT
TAPE CLOTH SURG 4X10 WHT LF (GAUZE/BANDAGES/DRESSINGS) IMPLANT
TAPE PAPER 2X10 WHT MICROPORE (GAUZE/BANDAGES/DRESSINGS) IMPLANT
TOWEL GREEN STERILE (TOWEL DISPOSABLE) ×2 IMPLANT
TOWEL GREEN STERILE FF (TOWEL DISPOSABLE) ×2 IMPLANT
TRAY FOLEY SLVR 16FR TEMP STAT (SET/KITS/TRAYS/PACK) ×2 IMPLANT
TUBE SUCT INTRACARD DLP 20F (MISCELLANEOUS) IMPLANT
UNDERPAD 30X36 HEAVY ABSORB (UNDERPADS AND DIAPERS) ×2 IMPLANT
VALVE AORTIC SZ21 INSP/RESIL (Valve) IMPLANT
VENT LEFT HEART 12002 (CATHETERS) ×2
WATER STERILE IRR 1000ML POUR (IV SOLUTION) ×4 IMPLANT

## 2022-06-29 NOTE — Op Note (Signed)
CARDIOVASCULAR SURGERY OPERATIVE NOTE  06/29/2022 Olivia Mcgrath [id number]  Surgeon:  Gaye Pollack, MD  First Assistant: Jadene Pierini,  PA-C: An experienced assistant was required given the complexity of this surgery and the standard of surgical care. The assistant was needed for exposure, dissection, suctioning, retraction of delicate tissues and sutures, instrument exchange and for overall help during this procedure.    Preoperative Diagnosis:  Severe bicuspid aortic valve stenosis   Postoperative Diagnosis:  Same   Procedure:  Median Sternotomy Extracorporeal circulation 3.   Aortic valve replacement using a 21 mm Edwards INSPIRIS RESILIA pericardial valve.  Anesthesia:  General Endotracheal   Clinical History/Surgical Indication:  This 61 year old woman has stage D, severe, symptomatic bicuspid aortic stenosis with NYHA class II symptoms of exertional fatigue and shortness of breath consistent with chronic diastolic congestive heart failure.  She has also been having episodes of chest discomfort and dizziness.  I personally reviewed her 2D echocardiogram, cardiac catheterization, and CTA studies.  She has a heavily calcified bicuspid aortic valve with restricted leaflet mobility.  The mean gradient is 42 mmHg consistent with severe aortic stenosis.  Left ventricular ejection fraction is normal.  Cardiac catheterization in April 2022 showed no coronary disease.  I do not think that needs to be repeated.  CTA of the chest showed no evidence of aortic aneurysm disease.  I agree that aortic valve replacement is indicated in this patient for relief of her symptoms and to prevent left ventricular dysfunction.  Given her age I think a bioprosthetic valve would be a good choice for her.  I discussed the alternatives of mechanical and bioprosthetic valves and she would like to avoid being on Coumadin if possible. I discussed the operative procedure with the patient and her daughter including  alternatives, benefits and risks; including but not limited to bleeding, blood transfusion, infection, stroke, myocardial infarction, graft failure, heart block requiring a permanent pacemaker, organ dysfunction, and death.  Olivia Mcgrath understands and agrees to proceed.   Preparation:  The patient was seen in the preoperative holding area and the correct patient, correct operation were confirmed with the patient after reviewing the medical record and catheterization. The consent was signed by me. Preoperative antibiotics were given. A pulmonary arterial line and radial arterial line were placed by the anesthesia team. The patient was taken back to the operating room and positioned supine on the operating room table. After being placed under general endotracheal anesthesia by the anesthesia team a foley catheter was placed. The neck, chest, abdomen, and both legs were prepped with betadine soap and solution and draped in the usual sterile manner. A surgical time-out was taken and the correct patient and operative procedure were confirmed with the nursing and anesthesia staff.   Pre-bypass TEE:   Complete TEE assessment was performed by Dr. Laurie Panda. This showed severe bicuspid aortic valve stenosis, no AI, no MR, normal hyperdynamic LV and RV systolic function    Post-bypass TEE:   Normal functioning prosthetic aortic valve with no perivalvular leak or regurgitation through the valve. Left ventricular function preserved. No mitral regurgitation.    Cardiopulmonary Bypass:  A median sternotomy was performed. The pericardium was opened in the midline. Right ventricular function appeared normal. The ascending aorta was of normal size and had no palpable plaque. There were no contraindications to aortic cannulation or cross-clamping. The patient was fully systemically heparinized and the ACT was maintained > 400 sec. The proximal aortic arch was cannulated with a 20 F aortic  cannula for  arterial inflow. Venous cannulation was performed via the right atrial appendage using a two-staged venous cannula. An antegrade cardioplegia/vent cannula was inserted into the mid-ascending aorta. A left ventricular vent was placed via the right superior pulmonary vein. A retrograde cardioplegia cannnula was placed into the coronary sinus via the right atrium. Aortic occlusion was performed with a single cross-clamp. Systemic cooling to 32 degrees Centigrade and topical cooling of the heart with iced saline were used. Cold antegrade KBC cardioplegia was used to induce diastolic arrest and then warm retrograde reanimation cardioplegia was given prior to removing the cross clamp. A temperature probe was inserted into the interventricular septum and an insulating pad was placed in the pericardium. Carbon dioxide was insufflated into the pericardium at 5L/min throughout the procedure to minimize intracardiac air.   Aortic Valve Replacement:   A transverse aortotomy was performed 1 cm above the sino-tubular junction. The native valve was a true bicuspid with 2 commissures located anteriorly and posteriorly with calcified and thickened leaflets and mild annular calcification. The ostia of the coronary arteries were in normal position and were not obstructed. The native valve leaflets were excised and the annulus was decalcified with rongeurs. Care was taken to remove all particulate debris. The left ventricle was directly inspected for debris and then irrigated with ice saline solution. The annulus was sized and a size 21 mm Edwards INSPIRIS RESILIA  valve was chosen. A 23 mm sizer would not fit in the annulus. The model number was [id number] and the serial number was [id number]. While the valve was being prepared 2-0 Ethibond pledgeted horizontal mattress sutures were placed around the annulus with the pledgets in a sub-annular position. The sutures were placed through the sewing ring and the valve lowered into place.  The sutures were tied using CorKnots. The valve seated nicely and the coronary ostia were not obstructed. The prosthetic valve leaflets moved normally and there was no sub-valvular obstruction. The aortotomy was closed using 4-0 Prolene suture in 2 layers with felt strips to reinforce the closure.  Completion:  The patient was rewarmed to 37 degrees Centigrade. De-airing maneuvers were performed and the head placed in trendelenburg position. The crossclamp was removed with a time of 83 minutes. There was spontaneous return of sinus rhythm. The aortotomy was checked for hemostasis. Two temporary epicardial pacing wires were placed on the right atrium and two on the right ventricle. The left ventricular vent and retrograde cardioplegia cannulas were removed. The patient was weaned from CPB without difficulty on no inotropes. CPB time was 106 minutes. Cardiac output was 5 LPM. Heparin was fully reversed with protamine and the aortic and venous cannulas removed. Hemostasis was achieved. Mediastinal drainage tubes were placed. The sternum was closed with #6 stainless steel wires. The fascia was closed with continuous # 1 vicryl suture. The subcutaneous tissue was closed with 2-0 vicryl continuous suture. The skin was closed with 3-0 vicryl subcuticular suture. All sponge, needle, and instrument counts were reported correct at the end of the case. Dry sterile dressings were placed over the incisions and around the chest tubes which were connected to pleurevac suction. The patient was then transported to the surgical intensive care unit in stable condition.

## 2022-06-29 NOTE — Interval H&P Note (Signed)
History and Physical Interval Note:  06/29/2022 6:53 AM  Olivia Mcgrath  has presented today for surgery, with the diagnosis of SEVERE AS.  The various methods of treatment have been discussed with the patient and family. After consideration of risks, benefits and other options for treatment, the patient has consented to  Procedure(s): AORTIC VALVE REPLACEMENT (AVR) (N/A) TRANSESOPHAGEAL ECHOCARDIOGRAM (TEE) (N/A) as a surgical intervention.  The patient's history has been reviewed, patient examined, no change in status, stable for surgery.  I have reviewed the patient's chart and labs.  Questions were answered to the patient's satisfaction.     Gaye Pollack

## 2022-06-29 NOTE — Progress Notes (Signed)
     [id number]       [place] [address]             [phone]       EVENING ROUNDS  SP AVR today Good hemodynamics Not bleeding Just extubated Looks good

## 2022-06-29 NOTE — Hospital Course (Addendum)
  PCP is Jene Every, MD Referring Provider is Leonie Man, MD   HPI:   The patient is a 61 year old woman with a history of bicuspid aortic valve with moderate aortic stenosis with a mean gradient of 30 mmHg and July 2021.  She was fairly asymptomatic at that time but was not doing too much activity due to recovering from an ankle fracture requiring ORIF.  She subsequently developed COVID-19 infection in August 2021 and was treated with oxygen in the hospital as well as remdesivir and steroids.  She recovered but then had a second episode of COVID in January 2022.  She subsequently presented in April 2022 with substernal chest pain and had 9 specific EKG changes with an elevated troponin.  There was no evidence of PE on CT scan.  Echocardiogram showed a mean gradient across aortic valve of 44 mmHg with a dimensionless index of 0.24 consistent with severe aortic stenosis.  Aortic valve area was 0.9 cm.  Left ventricular ejection fraction was 60 to 65% with mild LVH and grade 1 diastolic dysfunction.  Right ventricular systolic function was normal.  She underwent cardiac catheterization on 11/08/2020 showing normal coronary anatomy.  The mean gradient across the aortic valve was measured at 30 mmHg with a valve area 1.16 cm.  Left and right heart filling pressures were normal with normal cardiac output.  She subsequently underwent a cardiac MRI on 11/09/2020 that was read as being consistent with acute myocarditis.  Left and right ventricular ejection fractions were normal.  She was treated with colchicine.  Her chest pain and shortness of breath gradually resolved.  She now presents with progressive exertional fatigue and shortness of breath as well as some dizziness without syncope.  Her symptoms seem to be worse when she is carrying things.  A follow-up echocardiogram on 02/02/2022 shows bicuspid aortic valve with a mean gradient of 42 mmHg.  Ejection fraction is 60 to 65%.   She has a daughter who is  a Marine scientist and used to work in the cardiac intensive care unit.  She works as a Copywriter, advertising.  The patient was evaluated by Dr. Cyndia Bent and not all relevant studies were reviewed.  He recommended proceeding with aortic valve replacement she was admitted this hospitalization for the procedure.  Hospital course:  The patient was admitted for elective surgery on 06/29/2022 and was taken to the operating room at which time she underwent aortic valve replacement with a #21 Inspiris Resilia bioprosthetic.  She tolerated the procedure well and was taken to the surgical ICU in stable condition  Postoperative hospital course:  The patient was extubated using standard protocols without difficulty.  Remained hemodynamically stable.  She is neurologically intact.  All routine lines, monitors and drainage devices were discontinued in the standard fashion.  Has had some early postoperative nausea and narcotics were changed to Toradol.  Renal function has remained within normal limits.  She has a minor expected acute blood loss anemia which is being monitored clinically.  She developed some postoperative nausea that was managed with a course of IV Reglan.  She was ready for transfer to [facility] on postop day 2.  Nausea improved.  She continued to progress with mobility.

## 2022-06-29 NOTE — Anesthesia Procedure Notes (Signed)
Procedure Name: Intubation Date/Time: 06/29/2022 7:50 AM  Performed by: Heide Scales, CRNAPre-anesthesia Checklist: Patient identified, Emergency Drugs available, Suction available and Patient being monitored Patient Re-evaluated:Patient Re-evaluated prior to induction Oxygen Delivery Method: Circle system utilized Preoxygenation: Pre-oxygenation with 100% oxygen Induction Type: IV induction Ventilation: Mask ventilation without difficulty and Oral airway inserted - appropriate to patient size Laryngoscope Size: Mac and 4 Grade View: Grade I Tube type: Oral Tube size: 8.0 mm Number of attempts: 1 Airway Equipment and Method: Stylet and Oral airway Placement Confirmation: ETT inserted through vocal cords under direct vision, positive ETCO2 and breath sounds checked- equal and bilateral Secured at: 23 cm Tube secured with: Tape Dental Injury: Teeth and Oropharynx as per pre-operative assessment

## 2022-06-29 NOTE — Transfer of Care (Signed)
Immediate Anesthesia Transfer of Care Note  Patient: Olivia Mcgrath  Procedure(s) Performed: AORTIC VALVE REPLACEMENT (AVR) USING INSPIRIS RESILIA AORTIC VALVE SIZE: 21MM (Chest) TRANSESOPHAGEAL ECHOCARDIOGRAM (TEE)  Patient Location: ICU  Anesthesia Type:General  Level of Consciousness: Patient remains intubated per anesthesia plan  Airway & Oxygen Therapy: Patient remains intubated per anesthesia plan and Patient placed on Ventilator (see vital sign flow sheet for setting)  Post-op Assessment: Report given to RN and Post -op Vital signs reviewed and stable  Post vital signs: Reviewed and stable  Last Vitals:  Vitals Value Taken Time  BP 99/70 06/29/22 1218  Temp    Pulse 72 06/29/22 1229  Resp 13 06/29/22 1229  SpO2 96 % 06/29/22 1229  Vitals shown include unvalidated device data.  Last Pain:  Vitals:   06/29/22 0629  TempSrc: Oral  PainSc:       Patients Stated Pain Goal: 0 (84/13/24 4010)  Complications: No notable events documented.

## 2022-06-29 NOTE — Brief Op Note (Signed)
06/29/2022  7:15 AM  PATIENT:  Olivia Mcgrath  61 y.o. female  PRE-OPERATIVE DIAGNOSIS:  SEVERE AORTIC STENOSIS  POST-OPERATIVE DIAGNOSIS:  SEVERE AORTIC STENOSIS  PROCEDURE:  Procedure(s): AORTIC VALVE REPLACEMENT (AVR) USING INSPIRIS RESILIA AORTIC VALVE SIZE: 21MM (N/A) TRANSESOPHAGEAL ECHOCARDIOGRAM (TEE) (N/A)  SURGEON:  Surgeon(s) and Role:    * Bartle, Fernande Boyden, MD - Primary  PHYSICIAN ASSISTANT: Amya Hlad PA-C  ASSISTANTS: STAFF RNFA   ANESTHESIA:   general  EBL:  800 mL   BLOOD ADMINISTERED:none  DRAINS:  2 MEDIASTINAL chest tubes     LOCAL MEDICATIONS USED:  NONE  SPECIMEN:  Source of Specimen:  Aortic valve leaflets  DISPOSITION OF SPECIMEN:  PATHOLOGY  COUNTS:  YES  TOURNIQUET:  * No tourniquets in log *  DICTATION: .Dragon Dictation  PLAN OF CARE: Admit to inpatient   PATIENT DISPOSITION:  ICU - intubated and hemodynamically stable.   Delay start of Pharmacological VTE agent (>24hrs) due to surgical blood loss or risk of bleeding: yes  Complications: No known

## 2022-06-29 NOTE — Anesthesia Procedure Notes (Signed)
Arterial Line Insertion Start/End11/30/2023 7:05 AM, 06/29/2022 7:08 AM Performed by: Janace Litten, CRNA, CRNA  Patient location: Pre-op. Preanesthetic checklist: patient identified, IV checked, site marked, risks and benefits discussed, surgical consent, monitors and equipment checked, pre-op evaluation, timeout performed and anesthesia consent Lidocaine 1% used for infiltration Left, radial was placed Catheter size: 20 G Hand hygiene performed , maximum sterile barriers used  and Seldinger technique used Allen's test indicative of satisfactory collateral circulation Attempts: 2 Procedure performed using ultrasound guided technique. Ultrasound Notes:anatomy identified, needle tip was noted to be adjacent to the nerve/plexus identified and no ultrasound evidence of intravascular and/or intraneural injection Following insertion, dressing applied. Post procedure assessment: normal and unchanged  Patient tolerated the procedure well with no immediate complications.

## 2022-06-29 NOTE — Procedures (Signed)
Extubation Procedure Note  Patient Details:   Name: Olivia Mcgrath DOB: 03-06-1961 MRN: [id number]   Airway Documentation:    Vent end date: 06/29/22 Vent end time: 1610   Evaluation  O2 sats: stable throughout Complications: No apparent complications Patient did tolerate procedure well. Bilateral Breath Sounds: Clear, Diminished   Yes  NIF -40 VC  1.2L  Pt extubated to [place] with RN X2 at bedside. Positive cuff leak noted and pt is tolerating well. RT will monitor.  Cathie Olden 06/29/2022, 4:12 PM

## 2022-06-29 NOTE — Anesthesia Preprocedure Evaluation (Signed)
Anesthesia Evaluation  Patient identified by MRN, date of birth, ID band Patient awake    Reviewed: Allergy & Precautions, NPO status , Patient's Chart, lab work & pertinent test results  History of Anesthesia Complications (+) PONV and history of anesthetic complications  Airway Mallampati: II  TM Distance: >3 FB Neck ROM: Full    Dental  (+) Teeth Intact, Dental Advisory Given   Pulmonary neg pulmonary ROS   breath sounds clear to auscultation       Cardiovascular (-) hypertension(-) angina (-) Past MI + Valvular Problems/Murmurs AS  Rhythm:Regular  1. Left ventricular ejection fraction, by estimation, is 60 to 65%. The  left ventricle has normal function. The left ventricle has no regional  wall motion abnormalities. Left ventricular diastolic parameters are  indeterminate. The average left  ventricular global longitudinal strain is -22.6 %. The global longitudinal  strain is normal.   2. Right ventricular systolic function is normal. The right ventricular  size is normal. There is mildly elevated pulmonary artery systolic  pressure.   3. Left atrial size was mild to moderately dilated.   4. The mitral valve is normal in structure. Trivial mitral valve  regurgitation. No evidence of mitral stenosis.   5. The aortic valve is bicuspid. Aortic valve regurgitation is not  visualized. Severe aortic valve stenosis. Aortic valve mean gradient  measures 42.0 mmHg. Aortic valve Vmax measures 4.08 m/s.   6. The inferior vena cava is dilated in size with <50% respiratory  variability, suggesting right atrial pressure of 15 mmHg.   Comparison(s): Changes from prior study are noted. 05/13/21 EF 60-65%.  Moderate-severe AS 31mHg mean PG, 510mg peak PG. Ascending aorta 40 mm.    The left ventricular systolic function is normal.  LV end diastolic pressure is normal.  The left ventricular ejection fraction is 55-65% by visual  estimate.  There is moderate aortic valve stenosis.   1. Normal coronary anatomy 2. Moderate Aortic stenosis. Mean gradient 30 mm Hg. AVA 1.16 cm squared with index 0.58 3. Normal LV filling pressures 4. Normal right heart pressures. 5. Normal cardiac output.   Plan; no clear cause for chest pain identified. Medical management.     Neuro/Psych  PSYCHIATRIC DISORDERS Anxiety      Neuromuscular disease    GI/Hepatic negative GI ROS, Neg liver ROS,,,  Endo/Other  negative endocrine ROS    Renal/GU negative Renal ROS     Musculoskeletal   Abdominal   Peds  Hematology negative hematology ROS (+) Lab Results      Component                Value               Date                      WBC                      9.2                 06/27/2022                HGB                      14.7                06/27/2022                HCT  44.6                06/27/2022                MCV                      94.5                06/27/2022                PLT                      316                 06/27/2022              Anesthesia Other Findings   Reproductive/Obstetrics                              Anesthesia Physical Anesthesia Plan  ASA: 4  Anesthesia Plan: General   Post-op Pain Management: Ofirmev IV (intra-op)*   Induction: Intravenous  PONV Risk Score and Plan: 4 or greater and Ondansetron, Dexamethasone, TIVA, Propofol infusion and Midazolam  Airway Management Planned: Oral ETT  Additional Equipment: Arterial line, CVP, PA Cath, TEE and Ultrasound Guidance Line Placement  Intra-op Plan:   Post-operative Plan: Post-operative intubation/ventilation  Informed Consent: I have reviewed the patients History and Physical, chart, labs and discussed the procedure including the risks, benefits and alternatives for the proposed anesthesia with the patient or authorized representative who has indicated his/her understanding and  acceptance.     Dental advisory given  Plan Discussed with: CRNA  Anesthesia Plan Comments:          Anesthesia Quick Evaluation

## 2022-06-30 ENCOUNTER — Encounter (HOSPITAL_COMMUNITY): Payer: Self-pay | Admitting: Surgery

## 2022-06-30 ENCOUNTER — Other Ambulatory Visit: Payer: Self-pay | Admitting: Cardiology

## 2022-06-30 ENCOUNTER — Inpatient Hospital Stay (HOSPITAL_COMMUNITY): Payer: Commercial Managed Care - HMO

## 2022-06-30 DIAGNOSIS — Z9889 Other specified postprocedural states: Secondary | ICD-10-CM

## 2022-06-30 LAB — BASIC METABOLIC PANEL
Anion gap: 11 (ref 5–15)
Anion gap: 6 (ref 5–15)
BUN: 6 mg/dL (ref 6–20)
BUN: 8 mg/dL (ref 6–20)
CO2: 23 mmol/L (ref 22–32)
CO2: 28 mmol/L (ref 22–32)
Calcium: 7.7 mg/dL — ABNORMAL LOW (ref 8.9–10.3)
Calcium: 8.3 mg/dL — ABNORMAL LOW (ref 8.9–10.3)
Chloride: 102 mmol/L (ref 98–111)
Chloride: 103 mmol/L (ref 98–111)
Creatinine, Ser: 0.49 mg/dL (ref 0.44–1.00)
Creatinine, Ser: 0.62 mg/dL (ref 0.44–1.00)
GFR, Estimated: >60 mL/min (ref >60)
GFR, Estimated: >60 mL/min (ref >60)
Glucose, Bld: 114 mg/dL — ABNORMAL HIGH (ref 70–99)
Glucose, Bld: 142 mg/dL — ABNORMAL HIGH (ref 70–99)
Potassium: 4.1 mmol/L (ref 3.5–5.1)
Potassium: 4.3 mmol/L (ref 3.5–5.1)
Sodium: 136 mmol/L (ref 135–145)
Sodium: 137 mmol/L (ref 135–145)

## 2022-06-30 LAB — CBC
HCT: 34.9 % — ABNORMAL LOW (ref 36.0–46.0)
HCT: 35 % — ABNORMAL LOW (ref 36.0–46.0)
Hemoglobin: 11.8 g/dL — ABNORMAL LOW (ref 12.0–15.0)
Hemoglobin: 11.8 g/dL — ABNORMAL LOW (ref 12.0–15.0)
MCH: 31.5 pg (ref 26.0–34.0)
MCH: 31.9 pg (ref 26.0–34.0)
MCHC: 33.8 g/dL (ref 30.0–36.0)
MCHC: 33.7 g/dL (ref 30.0–36.0)
MCV: 93.1 fL (ref 80.0–100.0)
MCV: 94.6 fL (ref 80.0–100.0)
Platelets: 182 10*3/uL (ref 150–400)
Platelets: 163 10*3/uL (ref 150–400)
RBC: 3.75 MIL/uL — ABNORMAL LOW (ref 3.87–5.11)
RBC: 3.7 MIL/uL — ABNORMAL LOW (ref 3.87–5.11)
RDW: 12.6 % (ref 11.5–15.5)
RDW: 12.8 % (ref 11.5–15.5)
WBC: 15.9 10*3/uL — ABNORMAL HIGH (ref 4.0–10.5)
WBC: 17.1 10*3/uL — ABNORMAL HIGH (ref 4.0–10.5)
nRBC: 0 % (ref 0.0–0.2)
nRBC: 0 % (ref 0.0–0.2)

## 2022-06-30 LAB — GLUCOSE, CAPILLARY
Glucose-Capillary: 108 mg/dL — ABNORMAL HIGH (ref 70–99)
Glucose-Capillary: 114 mg/dL — ABNORMAL HIGH (ref 70–99)
Glucose-Capillary: 134 mg/dL — ABNORMAL HIGH (ref 70–99)
Glucose-Capillary: 110 mg/dL — ABNORMAL HIGH (ref 70–99)
Glucose-Capillary: 114 mg/dL — ABNORMAL HIGH (ref 70–99)
Glucose-Capillary: 117 mg/dL — ABNORMAL HIGH (ref 70–99)
Glucose-Capillary: 104 mg/dL — ABNORMAL HIGH (ref 70–99)
Glucose-Capillary: 116 mg/dL — ABNORMAL HIGH (ref 70–99)
Glucose-Capillary: 136 mg/dL — ABNORMAL HIGH (ref 70–99)
Glucose-Capillary: 114 mg/dL — ABNORMAL HIGH (ref 70–99)

## 2022-06-30 LAB — SURGICAL PATHOLOGY

## 2022-06-30 LAB — MAGNESIUM
Magnesium: 2.6 mg/dL — ABNORMAL HIGH (ref 1.7–2.4)
Magnesium: 2.6 mg/dL — ABNORMAL HIGH (ref 1.7–2.4)

## 2022-06-30 MED ORDER — FUROSEMIDE 40 MG PO TABS
40.0000 mg | ORAL_TABLET | Freq: Every day | ORAL | Status: AC
  Administered 2022-07-01 – 2022-07-02 (×2): 40 mg via ORAL
  Filled 2022-06-30 (×2): qty 1

## 2022-06-30 MED ORDER — FUROSEMIDE 10 MG/ML IJ SOLN
40.0000 mg | Freq: Two times a day (BID) | INTRAMUSCULAR | Status: DC
  Administered 2022-06-30: 40 mg via INTRAVENOUS
  Filled 2022-06-30: qty 4

## 2022-06-30 MED ORDER — SODIUM CHLORIDE 0.9 % IV SOLN
6.2500 mg | Freq: Four times a day (QID) | INTRAVENOUS | Status: DC | PRN
  Administered 2022-06-30 – 2022-07-01 (×4): 6.25 mg via INTRAVENOUS
  Filled 2022-06-30 (×4): qty 0.25

## 2022-06-30 MED ORDER — INSULIN ASPART 100 UNIT/ML IJ SOLN
0.0000 [IU] | INTRAMUSCULAR | Status: DC
  Administered 2022-06-30 – 2022-07-01 (×2): 2 [IU] via SUBCUTANEOUS

## 2022-06-30 MED ORDER — KETOROLAC TROMETHAMINE 15 MG/ML IJ SOLN
15.0000 mg | Freq: Four times a day (QID) | INTRAMUSCULAR | Status: DC | PRN
  Administered 2022-06-30 – 2022-07-03 (×8): 15 mg via INTRAVENOUS
  Filled 2022-06-30 (×8): qty 1

## 2022-06-30 MED ORDER — METOPROLOL TARTRATE 25 MG PO TABS
25.0000 mg | ORAL_TABLET | Freq: Two times a day (BID) | ORAL | Status: DC
  Administered 2022-06-30 – 2022-07-03 (×7): 25 mg via ORAL
  Filled 2022-06-30 (×7): qty 1

## 2022-06-30 MED ORDER — POTASSIUM CHLORIDE CRYS ER 20 MEQ PO TBCR
20.0000 meq | EXTENDED_RELEASE_TABLET | Freq: Every day | ORAL | Status: AC
  Administered 2022-07-01 – 2022-07-02 (×2): 20 meq via ORAL
  Filled 2022-06-30 (×2): qty 1

## 2022-06-30 MED ORDER — ENOXAPARIN SODIUM 30 MG/0.3ML IJ SOSY
30.0000 mg | PREFILLED_SYRINGE | Freq: Every day | INTRAMUSCULAR | Status: DC
  Administered 2022-06-30 – 2022-07-02 (×3): 30 mg via SUBCUTANEOUS
  Filled 2022-06-30 (×3): qty 0.3

## 2022-06-30 MED ORDER — METOPROLOL TARTRATE 25 MG/10 ML ORAL SUSPENSION
25.0000 mg | Freq: Two times a day (BID) | ORAL | Status: DC
  Filled 2022-06-30 (×3): qty 10

## 2022-06-30 NOTE — Progress Notes (Signed)
TTE s/p AVR. Results to Dr. Ellyn Hack and Dr. Cyndia Bent.

## 2022-06-30 NOTE — Progress Notes (Signed)
CT surgery PM rounds  Patient resting comfortably Nausea controlled P.m. labs satisfactory-potassium 4.3, hemoglobin 11.8  Blood pressure 120/70, pulse 74, temperature 98.9 F (37.2 C), temperature source Axillary, resp. rate 10, height 5' 9.5" (1.765 m), weight 88.8 kg, SpO2 98 %.

## 2022-06-30 NOTE — Progress Notes (Signed)
1 Day Post-Op Procedure(s) (LRB): AORTIC VALVE REPLACEMENT (AVR) USING INSPIRIS RESILIA AORTIC VALVE SIZE: 21MM (N/A) TRANSESOPHAGEAL ECHOCARDIOGRAM (TEE) (N/A) Subjective: Some nausea overnight felt to be pain med related.  Objective: Vital signs in last 24 hours: Temp:  [97.2 F (36.2 C)-99.3 F (37.4 C)] 98.4 F (36.9 C) (12/01 0645) Pulse Rate:  [70-94] 81 (12/01 0645) Cardiac Rhythm: Normal sinus rhythm (12/01 0200) Resp:  [9-24] 10 (12/01 0645) BP: (74-128)/(55-88) 110/82 (12/01 0600) SpO2:  [92 %-100 %] 95 % (12/01 0645) Arterial Line BP: (31)/(19) 31/19 (11/30 1220) FiO2 (%):  [40 %-50 %] 40 % (11/30 1540) Weight:  [88.8 kg] 88.8 kg (12/01 0637)  Hemodynamic parameters for last 24 hours: PAP: (16-41)/(7-25) 26/17 CO:  [3.3 L/min-5.5 L/min] 4.3 L/min CI:  [1.6 L/min/m2-2.7 L/min/m2] 2.1 L/min/m2  Intake/Output from previous day: 11/30 0701 - 12/01 0700 In: 4628.4 [P.O.:90; I.V.:2804.9; Blood:500; IV Piggyback:1233.5] Out: 5462 [Urine:4270; Emesis/NG output:50; Blood:800; Chest Tube:342] Intake/Output this shift: No intake/output data recorded.  General appearance: alert and cooperative Neurologic: intact Heart: regular rate and rhythm, S1, S2 normal, no murmur, click, rub or gallop Lungs: clear to auscultation bilaterally Extremities: edema mild Wound: dressing dry  Lab Results: Recent Labs    06/29/22 1800 06/29/22 1802 06/30/22 0427  WBC 16.5*  --  15.9*  HGB 13.0 12.9 11.8*  HCT 37.9 38.0 34.9*  PLT 183  --  182   BMET:  Recent Labs    06/29/22 1800 06/29/22 1802 06/30/22 0427  NA 138 138 136  K 4.4 4.6 4.1  CL 108  --  102  CO2 24  --  23  GLUCOSE 152*  --  114*  BUN 9  --  6  CREATININE 0.52  --  0.49  CALCIUM 7.2*  --  7.7*    PT/INR:  Recent Labs    06/29/22 1230  LABPROT 17.8*  INR 1.5*   ABG    Component Value Date/Time   PHART 7.350 06/29/2022 1802   HCO3 23.0 06/29/2022 1802   TCO2 24 06/29/2022 1802   ACIDBASEDEF 3.0  (H) 06/29/2022 1802   O2SAT 98 06/29/2022 1802   CBG (last 3)  Recent Labs    06/30/22 0206 06/30/22 0313 06/30/22 0412  GLUCAP 108* 110* 114*   CXR: bibasilar atelectasis  ECG: sinus 80, no acute changes  Assessment/Plan: S/P Procedure(s) (LRB): AORTIC VALVE REPLACEMENT (AVR) USING INSPIRIS RESILIA AORTIC VALVE SIZE: 21MM (N/A) TRANSESOPHAGEAL ECHOCARDIOGRAM (TEE) (N/A)  POD 1 Hemodynamically stable in sinus rhythm. Increase Lopressor to 25 bid.  Volume excess: start diuresis.  DC chest tubes after dangle this morning.  DC swan, arterial line  OOB, IS  Will add Toradol for pain since narcotics may be causing nausea.    LOS: 1 day    Gaye Pollack 06/30/2022

## 2022-06-30 NOTE — Discharge Summary (Signed)
[address]       [place] [address]             [phone]    Physician Discharge Summary  Patient ID: Olivia Mcgrath MRN: [id number] DOB/AGE: 59-09-1960 59 y.o.  Admit date: 06/29/2022 Discharge date: 07/03/2022  Admission Diagnoses:  Patient Active Problem List   Diagnosis Date Noted    06/29/2022   Dermatofibroma of right lower leg 05/10/2022   Melanocytic nevi of trunk 05/10/2022   Seborrheic keratoses 05/10/2022   Nevus of scalp 05/10/2022   Chronic myocarditis ([place]) 03/16/2021   SOB (shortness of breath) 03/16/2021   Acute deep vein thrombosis (DVT) of lower extremity ([place]) 03/16/2021   Acute myocarditis    COVID-19 03/13/2020   Polyarthralgia 01/26/2020   History of fracture of right ankle 05/19/2019   Overweight 05/19/2019   Bilateral bunions 03/30/2017   Morton neuroma, right 03/30/2017   Dyslipidemia, goal LDL below 100 09/29/2016   Aortic stenosis due to bicuspid aortic valve    S/P laparoscopic cholecystectomy 06/07/2016   Anxiety state 04/27/2016   Mood disorder ([place]) 04/27/2016   Bicuspid aortic valve 04/20/2016   Mild concentric left ventricular hypertrophy (LVH) 04/20/2016   Palpitations 01/18/2015   Varicose veins 01/18/2015   History of hypoglycemia 01/12/2014     Discharge Diagnoses:  Patient Active Problem List   Diagnosis Date Noted   S/P AVR (aortic valve replacement) 06/29/2022   Dermatofibroma of right lower leg 05/10/2022   Melanocytic nevi of trunk 05/10/2022   Seborrheic keratoses 05/10/2022   Nevus of scalp 05/10/2022   Chronic myocarditis ([place]) 03/16/2021   SOB (shortness of breath) 03/16/2021   Acute deep vein thrombosis (DVT) of lower extremity ([place]) 03/16/2021   Acute myocarditis    COVID-19 03/13/2020   Polyarthralgia 01/26/2020   History of fracture of right ankle 05/19/2019   Overweight 05/19/2019   Bilateral bunions 03/30/2017   Morton neuroma, right 03/30/2017   Dyslipidemia, goal LDL below 100  09/29/2016   Aortic stenosis due to bicuspid aortic valve    S/P laparoscopic cholecystectomy 06/07/2016   Anxiety state 04/27/2016   Mood disorder ([place]) 04/27/2016   Bicuspid aortic valve 04/20/2016   Mild concentric left ventricular hypertrophy (LVH) 04/20/2016   Palpitations 01/18/2015   Varicose veins 01/18/2015   History of hypoglycemia 01/12/2014     Discharged Condition: stable   PCP is Jene Every, MD Referring Provider is Leonie Man, MD   HPI:   The patient is a 61 year old woman with a history of bicuspid aortic valve with moderate aortic stenosis with a mean gradient of 30 mmHg and July 2021.  She was fairly asymptomatic at that time but was not doing too much activity due to recovering from an ankle fracture requiring ORIF.  She subsequently developed COVID-19 infection in August 2021 and was treated with oxygen in the hospital as well as remdesivir and steroids.  She recovered but then had a second episode of COVID in January 2022.  She subsequently presented in April 2022 with substernal chest pain and had 9 specific EKG changes with an elevated troponin.  There was no evidence of PE on CT scan.  Echocardiogram showed a mean gradient across aortic valve of 44 mmHg with a dimensionless index of 0.24 consistent with severe aortic stenosis.  Aortic valve area was 0.9 cm.  Left ventricular ejection fraction was 60 to 65% with mild LVH and grade 1 diastolic dysfunction.  Right ventricular systolic function was  normal.  She underwent cardiac catheterization on 11/08/2020 showing normal coronary anatomy.  The mean gradient across the aortic valve was measured at 30 mmHg with a valve area 1.16 cm.  Left and right heart filling pressures were normal with normal cardiac output.  She subsequently underwent a cardiac MRI on 11/09/2020 that was read as being consistent with acute myocarditis.  Left and right ventricular ejection fractions were normal.  She was treated with colchicine.   Her chest pain and shortness of breath gradually resolved.  She now presents with progressive exertional fatigue and shortness of breath as well as some dizziness without syncope.  Her symptoms seem to be worse when she is carrying things.  A follow-up echocardiogram on 02/02/2022 shows bicuspid aortic valve with a mean gradient of 42 mmHg.  Ejection fraction is 60 to 65%.   She has a daughter who is a Marine scientist and used to work in the cardiac intensive care unit.  She works as a Copywriter, advertising.  The patient was evaluated by Dr. Cyndia Bent and not all relevant studies were reviewed.  He recommended proceeding with aortic valve replacement she was admitted this hospitalization for the procedure.  Hospital course:  The patient was admitted for elective surgery on 06/29/2022 and was taken to the operating room at which time she underwent aortic valve replacement with a #21 Inspiris Resilia bioprosthetic.  She tolerated the procedure well and was taken to the surgical ICU in stable condition  Postoperative hospital course:  The patient was extubated using standard protocols without difficulty.  Remained hemodynamically stable.  She is neurologically intact.  All routine lines, monitors and drainage devices were discontinued in the standard fashion.  Has had some early postoperative nausea and narcotics were changed to Toradol.  Renal function has remained within normal limits.  She has a minor expected acute blood loss anemia which is being monitored clinically.  She developed some postoperative nausea and dizziness that were managed with a IV Reglan and oral Antivert.  She was ready for transfer to 4E Progressive Care on postop day 3.  Nausea and dizziness resolved.  She continued to progress with mobility.  She had return of bowel function. By post- op day 4 she was completely independent with mobility and self care. Her cardiac rhythm remained stable. The pacer wires were removed.  She was ready for discharge to  home.   Consults: None  Significant Diagnostic Studies:   DG Chest 2 View  Result Date: 07/02/2022 CLINICAL DATA:  Status post aortic valve replacement. EXAM: CHEST - 2 VIEW COMPARISON:  July 01, 2022 FINDINGS: The heart size and mediastinal contours are stable. Median sternotomy with AVR noted. Previously noted right jugular vascular sheath has been removed. Minimal atelectasis of right lung base is noted. The left lung is clear. There is no pulmonary edema or pleural effusion. The visualized skeletal structures are stable. IMPRESSION: No active cardiopulmonary disease. Electronically Signed   By: Abelardo Diesel M.D.   On: 07/02/2022 09:02   DG Chest Port 1 View  Result Date: 07/01/2022 CLINICAL DATA:  Status post aortic valve replacement. EXAM: PORTABLE CHEST 1 VIEW COMPARISON:  June 30, 2022 FINDINGS: Prior median sternotomy with AVR. Heart size is normal. Right jugular vascular sheath is identified with distal tip in the superior vena cava. Both lungs are clear. The visualized skeletal structures are unremarkable. IMPRESSION: No active cardiopulmonary disease. Electronically Signed   By: Abelardo Diesel M.D.   On: 07/01/2022 08:21   DG Chest [facility]  1 View  Result Date: 06/30/2022 CLINICAL DATA:  Status post aortic valve repair EXAM: PORTABLE CHEST 1 VIEW COMPARISON:  06/29/22 CXR FINDINGS: Right-sided PA catheter and sheath remain. Compared to prior exam there is slight interval retraction of the PA catheter. Sternotomy wires are intact. There is interval extubation and removal of the enteric tube. Right-sided thoracostomy tube and mediastinal drain remain. Epicardial pacing leads are also present. There is no pleural effusion. No pneumothorax. Unchanged enlarged cardiac and mediastinal contours. Improved aeration of the left lung base. Compared to prior exam there is slight interval increase in a left perihilar pulmonary opacity, nonspecific, possibly atelectatic. Visualized upper abdomen is  unremarkable. IMPRESSION: 1. Interval extubation and removal of the enteric tube. 2. Improved aeration of the left lung base. 3. No pneumothorax. Electronically Signed   By: Marin Roberts M.D.   On: 06/30/2022 08:07   DG Chest Port 1 View  Result Date: 06/29/2022 CLINICAL DATA:  Status post aortic valve replacement. EXAM: PORTABLE CHEST 1 VIEW COMPARISON:  06/19/2022 FINDINGS: Endotracheal tube is at the level of the clavicle heads. Nasogastric tube extends into the upper abdomen and likely in the stomach body region. There are 2 chest tubes overlying the heart and these are most compatible with mediastinal drains. Patient has a prosthetic aortic valve. Negative for pneumothorax. Right jugular central line with pulmonary catheter in the proximal right pulmonary artery region. Hazy densities in the medial left lung base are suggestive for atelectasis. Median sternotomy wires appear to be intact. IMPRESSION: 1. Support apparatuses as described. Negative for pneumothorax. 2. Left basilar densities are most compatible with atelectasis. Electronically Signed   By: Markus Daft M.D.   On: 06/29/2022 12:50   VAS US DOPPLER PRE CABG  Result Date: 06/27/2022 PREOPERATIVE VASCULAR EVALUATION Patient Name:  ADORIA KAWAMOTO  Date of Exam:   06/27/2022 Medical Rec #: [id number]     Accession #:    [id number] Date of Birth: [birth year]     Patient Gender: F Patient Age:   37 years Exam Location:  [facility] Procedure:      VAS US DOPPLER PRE CABG Referring Phys: Gilford Raid --------------------------------------------------------------------------------  Indications:      Pre op AVR. Comparison Study: 01/14/21 prior Performing Technologist: Archie Patten RVS  Examination Guidelines: A complete evaluation includes B-mode imaging, spectral Doppler, color Doppler, and power Doppler as needed of all accessible portions of each vessel. Bilateral testing is considered an integral part of a complete examination. Limited  examinations for reoccurring indications may be performed as noted.  Right Carotid Findings: +----------+--------+--------+--------+------------+--------+           PSV cm/sEDV cm/sStenosisDescribe    Comments +----------+--------+--------+--------+------------+--------+ CCA Prox  56      7               heterogenous         +----------+--------+--------+--------+------------+--------+ CCA Distal58      19              heterogenous         +----------+--------+--------+--------+------------+--------+ ICA Prox  60      22              heterogenous         +----------+--------+--------+--------+------------+--------+ ICA Distal57      21                                   +----------+--------+--------+--------+------------+--------+  ECA       51      7                                    +----------+--------+--------+--------+------------+--------+ +----------+--------+-------+--------+------------+           PSV cm/sEDV cmsDescribeArm Pressure +----------+--------+-------+--------+------------+ Subclavian107                                 +----------+--------+-------+--------+------------+ +---------+--------+--+--------+--+---------+ VertebralPSV cm/s41EDV cm/s11Antegrade +---------+--------+--+--------+--+---------+ Left Carotid Findings: +----------+--------+--------+--------+------------+--------+           PSV cm/sEDV cm/sStenosisDescribe    Comments +----------+--------+--------+--------+------------+--------+ CCA Prox  57      9               heterogenous         +----------+--------+--------+--------+------------+--------+ CCA Distal54      20              heterogenous         +----------+--------+--------+--------+------------+--------+ ICA Prox  72      28              heterogenous         +----------+--------+--------+--------+------------+--------+ ICA Distal75      36                                    +----------+--------+--------+--------+------------+--------+ ECA       50      6                                    +----------+--------+--------+--------+------------+--------+ +----------+--------+--------+--------+------------+ SubclavianPSV cm/sEDV cm/sDescribeArm Pressure +----------+--------+--------+--------+------------+           70                                   +----------+--------+--------+--------+------------+ +---------+--------+--+--------+--+---------+ VertebralPSV cm/s41EDV cm/s12Antegrade +---------+--------+--+--------+--+---------+  ABI Findings: +--------+------------------+-----+---------+--------+ Right   Rt Pressure (mmHg)IndexWaveform Comment  +--------+------------------+-----+---------+--------+ Brachial                       triphasic         +--------+------------------+-----+---------+--------+ +--------+------------------+-----+---------+-------+ Left    Lt Pressure (mmHg)IndexWaveform Comment +--------+------------------+-----+---------+-------+ Brachial                       triphasic        +--------+------------------+-----+---------+-------+  Right Doppler Findings: +--------+--------+-----+---------+--------+ Site    PressureIndexDoppler  Comments +--------+--------+-----+---------+--------+ Brachial             triphasic         +--------+--------+-----+---------+--------+ Radial               biphasic          +--------+--------+-----+---------+--------+ Ulnar                triphasic         +--------+--------+-----+---------+--------+  Left Doppler Findings: +--------+--------+-----+---------+--------+ Site    PressureIndexDoppler  Comments +--------+--------+-----+---------+--------+ Brachial             triphasic         +--------+--------+-----+---------+--------+ Radial  triphasic         +--------+--------+-----+---------+--------+ Ulnar                 triphasic         +--------+--------+-----+---------+--------+   Summary: Right Carotid: The extracranial vessels were near-normal with only minimal wall                thickening or plaque. Left Carotid: The extracranial vessels were near-normal with only minimal wall               thickening or plaque. Vertebrals: Bilateral vertebral arteries demonstrate antegrade flow. Right Upper Extremity: Doppler waveforms remain within normal limits with right radial compression. Doppler waveform obliterate with right ulnar compression. Left Upper Extremity: Doppler waveforms remain within normal limits with left radial compression. Doppler waveforms remain within normal limits with left ulnar compression.  Electronically signed by Servando Snare MD on 06/27/2022 at 6:58:49 PM.    Final    DG Chest 2 View  Result Date: 06/27/2022 CLINICAL DATA:  61 year old female with a history of preoperative chest x-ray EXAM: CHEST - 2 VIEW COMPARISON:  03/13/2021 FINDINGS: Cardiomediastinal silhouette unchanged in size and contour. No evidence of central vascular congestion. No interlobular septal thickening. Similar appearance of generous ascending aortic diameter/ectasia on the lateral view. No pneumothorax or pleural effusion. Coarsened interstitial markings, with no confluent airspace disease. No acute displaced fracture. Degenerative changes of the spine. IMPRESSION: Negative for acute cardiopulmonary disease Electronically Signed   By: Corrie Mckusick D.O.   On: 06/27/2022 16:28       Treatments: surgery:   CARDIOVASCULAR SURGERY OPERATIVE NOTE   06/29/2022 Olivia Mcgrath [id number]   Surgeon:  Gaye Pollack, MD   First Assistant: Jadene Pierini,  PA-C: An experienced assistant was required given the complexity of this surgery and the standard of surgical care. The assistant was needed for exposure, dissection, suctioning, retraction of delicate tissues and sutures, instrument exchange and for overall help during this  procedure.      Preoperative Diagnosis:  Severe bicuspid aortic valve stenosis     Postoperative Diagnosis:  Same     Procedure:   Median Sternotomy Extracorporeal circulation 3.   Aortic valve replacement using a 21 mm Edwards INSPIRIS RESILIA pericardial valve.   Anesthesia:  General Endotracheal     Clinical History/Surgical Indication:   This 61 year old woman has stage D, severe, symptomatic bicuspid aortic stenosis with NYHA class II symptoms of exertional fatigue and shortness of breath consistent with chronic diastolic congestive heart failure.  She has also been having episodes of chest discomfort and dizziness.  I personally reviewed her 2D echocardiogram, cardiac catheterization, and CTA studies.  She has a heavily calcified bicuspid aortic valve with restricted leaflet mobility.  The mean gradient is 42 mmHg consistent with severe aortic stenosis.  Left ventricular ejection fraction is normal.  Cardiac catheterization in April 2022 showed no coronary disease.  I do not think that needs to be repeated.  CTA of the chest showed no evidence of aortic aneurysm disease.  I agree that aortic valve replacement is indicated in this patient for relief of her symptoms and to prevent left ventricular dysfunction.  Given her age I think a bioprosthetic valve would be a good choice for her.  I discussed the alternatives of mechanical and bioprosthetic valves and she would like to avoid being on Coumadin if possible. I discussed the operative procedure with the patient and her daughter including alternatives, benefits and risks; including but  not limited to bleeding, blood transfusion, infection, stroke, myocardial infarction, graft failure, heart block requiring a permanent pacemaker, organ dysfunction, and death.  Olivia Mcgrath understands and agrees to proceed.   Surgeon:  Gaye Pollack, MD   First Assistant: Jadene Pierini,  PA-C:       Preoperative Diagnosis:  Severe bicuspid aortic valve  stenosis     Postoperative Diagnosis:  Same     Procedure:   Median Sternotomy Extracorporeal circulation 3.   Aortic valve replacement using a 21 mm Edwards INSPIRIS RESILIA pericardial valve.   Anesthesia:  General Endotracheal  Discharge Exam: Blood pressure (!) 140/74, pulse 85, temperature 98.5 F (36.9 C), temperature source Oral, resp. rate 20, height 5' 9.5" (1.765 m), weight 85.5 kg, SpO2 94 %.  General appearance: alert, cooperative, and no distress Neurologic: intact Heart: regular rate and rhythm Lungs: clear to auscultation bilaterally Abdomen: soft and non-tender. Extremities: no peripheral edema Wound: the sternal incision is well approximated and dry.    Discharge Medications:  The patient has been discharged on:   1.Beta Blocker:  Yes [  x ]                              No   [   ]                              If No, reason:  2.Ace Inhibitor/ARB: Yes [   ]                                     No  [ x   ]                                     If No, reason: no indication  3.Statin:   Yes [   ]                  No  [ x  ]                  If No, reason: no indication  4.Ecasa:  Yes  [ x ]                  No   [   ]                  If No, reason:  Patient had ACS upon admission: no  Plavix/P2Y12 inhibitor: Yes [   ]                                      No  [ x  ]     Discharge Instructions     AMB Referral to Cardiac Rehabilitation - Phase II   Complete by: As directed    Diagnosis: Valve Replacement   Valve: Aortic   After initial evaluation and assessments completed: Virtual Based Care may be provided alone or in conjunction with Phase 2 Cardiac Rehab based on patient barriers.: Yes   Intensive Cardiac Rehabilitation (ICR) [place] location only OR Traditional Cardiac Rehabilitation (TCR) *If criteria for ICR are not met will enroll  in TCR [facility] only): Yes      Allergies as of 07/03/2022       Reactions   Sulfa Antibiotics Rash         Medication List     STOP taking these medications    azithromycin 250 MG tablet Commonly known as: ZITHROMAX       TAKE these medications    acetaminophen 500 MG tablet Commonly known as: TYLENOL Take 500 mg by mouth every 6 (six) hours as needed for mild pain, moderate pain or headache.   albuterol 108 (90 Base) MCG/ACT inhaler Commonly known as: VENTOLIN HFA Inhale 2 puffs into the lungs every 6 (six) hours as needed for shortness of breath.   aspirin EC 325 MG tablet Take 1 tablet (325 mg total) by mouth daily. Start taking on: July 04, 2022   escitalopram 10 MG tablet Commonly known as: LEXAPRO Take 10 mg by mouth at bedtime.   estradiol 2 MG tablet Commonly known as: ESTRACE Take 2 mg by mouth at bedtime.   Fluocinolone Acetonide 0.01 % Oil Place 1 drop in ear(s) daily as needed (Itching ears).   guaiFENesin 600 MG 12 hr tablet Commonly known as: MUCINEX Take 600 mg by mouth as needed for cough or to loosen phlegm.   meclizine 25 MG tablet Commonly known as: ANTIVERT Take 1 tablet (25 mg total) by mouth 2 (two) times daily as needed for dizziness.   metoprolol tartrate 25 MG tablet Commonly known as: LOPRESSOR Take 1 tablet (25 mg total) by mouth 2 (two) times daily.   multivitamin with minerals tablet Take 1 tablet by mouth at bedtime. Woman's   traMADol 50 MG tablet Commonly known as: ULTRAM Take 1 tablet (50 mg total) by mouth every 4 (four) hours as needed for up to 7 days for moderate pain.   valACYclovir 1000 MG tablet Commonly known as: VALTREX Take 1,000 mg by mouth daily as needed (Outbreak).         Signed:  Antony Odea, PA-C  07/03/2022, 8:35 AM

## 2022-07-01 ENCOUNTER — Inpatient Hospital Stay (HOSPITAL_COMMUNITY): Payer: Commercial Managed Care - HMO

## 2022-07-01 LAB — CBC
HCT: 32.3 % — ABNORMAL LOW (ref 36.0–46.0)
Hemoglobin: 10.8 g/dL — ABNORMAL LOW (ref 12.0–15.0)
MCH: 32 pg (ref 26.0–34.0)
MCHC: 33.4 g/dL (ref 30.0–36.0)
MCV: 95.6 fL (ref 80.0–100.0)
Platelets: 150 10*3/uL (ref 150–400)
RBC: 3.38 MIL/uL — ABNORMAL LOW (ref 3.87–5.11)
RDW: 12.8 % (ref 11.5–15.5)
WBC: 16.5 10*3/uL — ABNORMAL HIGH (ref 4.0–10.5)
nRBC: 0 % (ref 0.0–0.2)

## 2022-07-01 LAB — BASIC METABOLIC PANEL
Anion gap: 7 (ref 5–15)
BUN: 11 mg/dL (ref 6–20)
CO2: 28 mmol/L (ref 22–32)
Calcium: 8.6 mg/dL — ABNORMAL LOW (ref 8.9–10.3)
Chloride: 104 mmol/L (ref 98–111)
Creatinine, Ser: 0.62 mg/dL (ref 0.44–1.00)
GFR, Estimated: >60 mL/min (ref >60)
Glucose, Bld: 111 mg/dL — ABNORMAL HIGH (ref 70–99)
Potassium: 4.2 mmol/L (ref 3.5–5.1)
Sodium: 139 mmol/L (ref 135–145)

## 2022-07-01 LAB — GLUCOSE, CAPILLARY
Glucose-Capillary: 110 mg/dL — ABNORMAL HIGH (ref 70–99)
Glucose-Capillary: 124 mg/dL — ABNORMAL HIGH (ref 70–99)
Glucose-Capillary: 81 mg/dL (ref 70–99)
Glucose-Capillary: 134 mg/dL — ABNORMAL HIGH (ref 70–99)
Glucose-Capillary: 116 mg/dL — ABNORMAL HIGH (ref 70–99)

## 2022-07-01 MED ORDER — ALUM & MAG HYDROXIDE-SIMETH 200-200-20 MG/5ML PO SUSP: 15.0000 mL | ORAL | Status: DC | PRN

## 2022-07-01 MED ORDER — GABAPENTIN 100 MG PO CAPS
100.0000 mg | ORAL_CAPSULE | Freq: Two times a day (BID) | ORAL | Status: DC
  Administered 2022-07-01 – 2022-07-02 (×4): 100 mg via ORAL
  Filled 2022-07-01 (×4): qty 1

## 2022-07-01 MED ORDER — SODIUM CHLORIDE 0.9 % IV SOLN: 250.0000 mL | INTRAVENOUS | Status: DC | PRN

## 2022-07-01 MED ORDER — SODIUM CHLORIDE 0.9% FLUSH
3.0000 mL | Freq: Two times a day (BID) | INTRAVENOUS | Status: DC
  Administered 2022-07-01 – 2022-07-03 (×4): 3 mL via INTRAVENOUS

## 2022-07-01 MED ORDER — INSULIN ASPART 100 UNIT/ML IJ SOLN: 0.0000 [IU] | Freq: Three times a day (TID) | INTRAMUSCULAR | Status: DC

## 2022-07-01 MED ORDER — ~~LOC~~ CARDIAC SURGERY, PATIENT & FAMILY EDUCATION: Freq: Once | Status: DC

## 2022-07-01 MED ORDER — SODIUM CHLORIDE 0.9% FLUSH: 3.0000 mL | INTRAVENOUS | Status: DC | PRN

## 2022-07-01 MED ORDER — METOCLOPRAMIDE HCL 5 MG/ML IJ SOLN
10.0000 mg | Freq: Four times a day (QID) | INTRAMUSCULAR | Status: AC
  Administered 2022-07-01 – 2022-07-02 (×4): 10 mg via INTRAVENOUS
  Filled 2022-07-01 (×4): qty 2

## 2022-07-01 NOTE — Progress Notes (Signed)
2 Days Post-Op Procedure(s) (LRB): AORTIC VALVE REPLACEMENT (AVR) USING INSPIRIS RESILIA AORTIC VALVE SIZE: 21MM (N/A) TRANSESOPHAGEAL ECHOCARDIOGRAM (TEE) (N/A) Subjective:  Nausea improving Ready for transfer to stepdown Objective: Vital signs in last 24 hours: Temp:  [98.6 F (37 C)-99.2 F (37.3 C)] 98.6 F (37 C) (12/02 1127) Pulse Rate:  [71-91] 87 (12/02 1300) Cardiac Rhythm: Normal sinus rhythm (12/02 0800) Resp:  [9-18] 18 (12/02 0600) BP: (96-120)/(60-73) 110/64 (12/02 1300) SpO2:  [90 %-98 %] 90 % (12/02 1300) Weight:  [86.1 kg] 86.1 kg (12/02 4010)  Hemodynamic parameters for last 24 hours:  Sinus rhythm stable  Intake/Output from previous day: 12/01 0701 - 12/02 0700 In: 789.2 [P.O.:60; I.V.:329.3; IV Piggyback:399.9] Out: 2405 [Urine:2365; Chest Tube:40] Intake/Output this shift: Total I/O In: 290 [P.O.:240; IV Piggyback:50] Out: 350 [Urine:350]       Exam    General- alert and comfortable    Neck- no JVD, no cervical adenopathy palpable, no carotid bruit   Lungs- clear without rales, wheezes   Cor- regular rate and rhythm, no murmur , gallop   Abdomen- soft, non-tender   Extremities - warm, non-tender, minimal edema   Neuro- oriented, appropriate, no focal weakness   Lab Results: Recent Labs    06/30/22 1643 07/01/22 0453  WBC 17.1* 16.5*  HGB 11.8* 10.8*  HCT 35.0* 32.3*  PLT 163 150   BMET:  Recent Labs    06/30/22 1643 07/01/22 0453  NA 137 139  K 4.3 4.2  CL 103 104  CO2 28 28  GLUCOSE 142* 111*  BUN 8 11  CREATININE 0.62 0.62  CALCIUM 8.3* 8.6*    PT/INR:  Recent Labs    06/29/22 1230  LABPROT 17.8*  INR 1.5*   ABG    Component Value Date/Time   PHART 7.350 06/29/2022 1802   HCO3 23.0 06/29/2022 1802   TCO2 24 06/29/2022 1802   ACIDBASEDEF 3.0 (H) 06/29/2022 1802   O2SAT 98 06/29/2022 1802   CBG (last 3)  Recent Labs    07/01/22 0502 07/01/22 0847 07/01/22 1126  GLUCAP 110* 124* 81    Assessment/Plan: S/P  Procedure(s) (LRB): AORTIC VALVE REPLACEMENT (AVR) USING INSPIRIS RESILIA AORTIC VALVE SIZE: 21MM (N/A) TRANSESOPHAGEAL ECHOCARDIOGRAM (TEE) (N/A) Mobilize Diuresis d/c tubes/lines Plan for transfer to step-down: see transfer orders Short course of Reglan for nausea, DC oxycodone, substitute with low-dose gabapentin  LOS: 2 days    Dahlia Byes 07/01/2022

## 2022-07-01 NOTE — Progress Notes (Signed)
CT surgery PM rounds  Patient resting comfortably Nausea has improved and able to walk outside the room today Waiting for bed on stepdown  Blood pressure 111/60, pulse 95, temperature 99.1 F (37.3 C), temperature source Oral, resp. rate 18, height 5' 9.5" (1.765 m), weight 86.1 kg, SpO2 92 %.

## 2022-07-01 NOTE — Plan of Care (Signed)

## 2022-07-01 NOTE — Care Management (Signed)
  Transition of Care [facility]) Screening Note   Patient Details  Name: Olivia Mcgrath Date of Birth: 1961/03/21   Transition of Care [facility]) CM/SW Contact:    Bethena Roys, RN Phone Number: 07/01/2022, 9:49 AM    Transition of Care Department [facility]) has reviewed the patient and no TOC needs have been identified at this time. Patient POD- 1 AVR. Case Manager will continue to monitor patient advancement through interdisciplinary progression rounds. If new patient transition needs arise, please place a TOC consult.

## 2022-07-02 ENCOUNTER — Inpatient Hospital Stay (HOSPITAL_COMMUNITY): Payer: Commercial Managed Care - HMO

## 2022-07-02 LAB — BASIC METABOLIC PANEL
Anion gap: 8 (ref 5–15)
BUN: 14 mg/dL (ref 6–20)
CO2: 25 mmol/L (ref 22–32)
Calcium: 8.4 mg/dL — ABNORMAL LOW (ref 8.9–10.3)
Chloride: 107 mmol/L (ref 98–111)
Creatinine, Ser: 0.61 mg/dL (ref 0.44–1.00)
GFR, Estimated: >60 mL/min (ref >60)
Glucose, Bld: 102 mg/dL — ABNORMAL HIGH (ref 70–99)
Potassium: 4.1 mmol/L (ref 3.5–5.1)
Sodium: 140 mmol/L (ref 135–145)

## 2022-07-02 LAB — CBC
HCT: 30.2 % — ABNORMAL LOW (ref 36.0–46.0)
Hemoglobin: 10 g/dL — ABNORMAL LOW (ref 12.0–15.0)
MCH: 31.3 pg (ref 26.0–34.0)
MCHC: 33.1 g/dL (ref 30.0–36.0)
MCV: 94.7 fL (ref 80.0–100.0)
Platelets: 154 10*3/uL (ref 150–400)
RBC: 3.19 MIL/uL — ABNORMAL LOW (ref 3.87–5.11)
RDW: 12.7 % (ref 11.5–15.5)
WBC: 13.8 10*3/uL — ABNORMAL HIGH (ref 4.0–10.5)
nRBC: 0 % (ref 0.0–0.2)

## 2022-07-02 LAB — GLUCOSE, CAPILLARY
Glucose-Capillary: 94 mg/dL (ref 70–99)
Glucose-Capillary: 137 mg/dL — ABNORMAL HIGH (ref 70–99)

## 2022-07-02 MED ORDER — MECLIZINE HCL 25 MG PO TABS
25.0000 mg | ORAL_TABLET | Freq: Two times a day (BID) | ORAL | Status: DC | PRN
  Administered 2022-07-02 (×2): 25 mg via ORAL
  Filled 2022-07-02 (×4): qty 1

## 2022-07-02 MED ORDER — ALPRAZOLAM 0.5 MG PO TABS
0.5000 mg | ORAL_TABLET | Freq: Two times a day (BID) | ORAL | Status: DC | PRN
  Administered 2022-07-02 – 2022-07-03 (×3): 0.5 mg via ORAL
  Filled 2022-07-02 (×3): qty 1

## 2022-07-02 NOTE — Anesthesia Postprocedure Evaluation (Signed)
Anesthesia Post Note  Patient: Olivia Mcgrath  Procedure(s) Performed: AORTIC VALVE REPLACEMENT (AVR) USING INSPIRIS RESILIA AORTIC VALVE SIZE: 21MM (Chest) TRANSESOPHAGEAL ECHOCARDIOGRAM (TEE)     Patient location during evaluation: ICU Anesthesia Type: General Level of consciousness: sedated Pain management: pain level controlled Vital Signs Assessment: post-procedure vital signs reviewed and stable Respiratory status: patient remains intubated per anesthesia plan Cardiovascular status: stable Postop Assessment: no apparent nausea or vomiting Anesthetic complications: no  No notable events documented.  Last Vitals:  Vitals:   07/02/22 0610 07/02/22 0745  BP: 119/65 (!) 113/54  Pulse:  86  Resp:  16  Temp:  36.8 C  SpO2:  93%    Last Pain:  Vitals:   07/02/22 0745  TempSrc: Oral  PainSc:    Pain Goal: Patients Stated Pain Goal: 2 (07/02/22 0525)                 Carolin Quang

## 2022-07-02 NOTE — Progress Notes (Signed)
Pt admitted to 4East 19.  Pt is A&O X4 and neuro intact.  Pt placed on telemetry and CCMD notified.  Pt is oriented to unit with call light in reach.  Pt is currently comfortable and not in pain.    07/02/22 1557  Vitals  Temp 98.5 F (36.9 C)  Temp Source Oral  BP (!) 115/91  MAP (mmHg) 100  BP Location Right Arm  BP Method Automatic  Patient Position (if appropriate) Lying  Pulse Rate 86  Pulse Rate Source Monitor  ECG Heart Rate 86  Resp (!) 21  Level of Consciousness  Level of Consciousness Alert  Oxygen Therapy  SpO2 97 %  O2 Device Room Air  Pain Assessment  Pain Scale 0-10  Pain Score 0  POSS Scale ([place]ero Opioid Sedation Scale)  POSS *See Group Information* 1-Acceptable,Awake and alert  PCA/Epidural/Spinal Assessment  Respiratory Pattern Regular;Unlabored  Glasgow Coma Scale  Eye Opening 4  Best Verbal Response (NON-intubated) 5  Best Motor Response 6  Glasgow Coma Scale Score 15  MEWS Score  MEWS Temp 0  MEWS Systolic 0  MEWS Pulse 0  MEWS RR 1  MEWS LOC 0  MEWS Score 1  MEWS Score Color Green

## 2022-07-02 NOTE — Progress Notes (Signed)
3 Days Post-Op Procedure(s) (LRB): AORTIC VALVE REPLACEMENT (AVR) USING INSPIRIS RESILIA AORTIC VALVE SIZE: 21MM (N/A) TRANSESOPHAGEAL ECHOCARDIOGRAM (TEE) (N/A) Subjective: Walking in hall, waiting for 4E bed Nausea almost resolved CXR today is clear NSR  Objective: Vital signs in last 24 hours: Temp:  [98.2 F (36.8 C)-99.1 F (37.3 C)] 98.9 F (37.2 C) (12/03 1150) Pulse Rate:  [77-95] 86 (12/03 0745) Resp:  [16-18] 16 (12/03 0745) BP: (104-126)/(54-75) 112/74 (12/03 1209) SpO2:  [91 %-95 %] 93 % (12/03 0745) Weight:  [85.1 kg] 85.1 kg (12/03 0500)  Hemodynamic parameters for last 24 hours:  stable  Intake/Output from previous day: 12/02 0701 - 12/03 0700 In: 1520 [P.O.:1420; IV Piggyback:100] Out: 800 [Urine:800] Intake/Output this shift: Total I/O In: 240 [P.O.:240] Out: -        Exam    General- alert and comfortable    Neck- no JVD, no cervical adenopathy palpable, no carotid bruit   Lungs- clear without rales, wheezes   Cor- regular rate and rhythm, no murmur , gallop   Abdomen- soft, non-tender   Extremities - warm, non-tender, minimal edema   Neuro- oriented, appropriate, no focal weakness   Lab Results: Recent Labs    07/01/22 0453 07/02/22 0311  WBC 16.5* 13.8*  HGB 10.8* 10.0*  HCT 32.3* 30.2*  PLT 150 154   BMET:  Recent Labs    07/01/22 0453 07/02/22 0311  NA 139 140  K 4.2 4.1  CL 104 107  CO2 28 25  GLUCOSE 111* 102*  BUN 11 14  CREATININE 0.62 0.61  CALCIUM 8.6* 8.4*    PT/INR: No results for input(s): "LABPROT", "INR" in the last 72 hours. ABG    Component Value Date/Time   PHART 7.350 06/29/2022 1802   HCO3 23.0 06/29/2022 1802   TCO2 24 06/29/2022 1802   ACIDBASEDEF 3.0 (H) 06/29/2022 1802   O2SAT 98 06/29/2022 1802   CBG (last 3)  Recent Labs    07/01/22 2326 07/02/22 0333 07/02/22 1151  GLUCAP 116* 94 137*    Assessment/Plan: S/P Procedure(s) (LRB): AORTIC VALVE REPLACEMENT (AVR) USING INSPIRIS RESILIA  AORTIC VALVE SIZE: 21MM (N/A) TRANSESOPHAGEAL ECHOCARDIOGRAM (TEE) (N/A) Mobilize Diuresis Plan for transfer to step-down: see transfer orders   LOS: 3 days    Olivia Mcgrath 07/02/2022

## 2022-07-02 NOTE — Progress Notes (Signed)
Taken to radiology for CXR. Transport uneventful. Remained on a monitor with RN

## 2022-07-03 MED ORDER — ASPIRIN 325 MG PO TBEC: 325.0000 mg | DELAYED_RELEASE_TABLET | Freq: Every day | ORAL | Status: DC

## 2022-07-03 MED ORDER — MECLIZINE HCL 25 MG PO TABS: 25.0000 mg | ORAL_TABLET | Freq: Two times a day (BID) | ORAL | 0 refills | Status: AC | PRN

## 2022-07-03 MED ORDER — METOPROLOL TARTRATE 25 MG PO TABS: 25.0000 mg | ORAL_TABLET | Freq: Two times a day (BID) | ORAL | 5 refills | Status: DC

## 2022-07-03 MED ORDER — TRAMADOL HCL 50 MG PO TABS: 50.0000 mg | ORAL_TABLET | ORAL | Status: DC | PRN

## 2022-07-03 MED ORDER — TRAMADOL HCL 50 MG PO TABS: 50.0000 mg | ORAL_TABLET | ORAL | 0 refills | Status: AC | PRN

## 2022-07-03 NOTE — TOC Transition Note (Signed)
Transition of Care (TOC) - CM/SW Discharge Note Marvetta Gibbons RN, BSN Transitions of Care Unit 4E- RN Case Manager See Treatment Team for direct phone #   Patient Details  Name: Olivia Mcgrath MRN: [id number] Date of Birth: 1960-08-28  Transition of Care [facility]) CM/SW Contact:  Dawayne Patricia, RN Phone Number: 07/03/2022, 11:48 AM   Clinical Narrative:    Pt stable for transition home today, Notified by cardiac rehab that pt needs RW for home. Order has been placed for DME- per staff pt does not have preference for DME provider.   CM placed referral into [employer] system for RW need- once processed with insurance - RW To be delivered to the room prior to discharge (process takes about an hour).   No further TOC needs- pt has transportation home.    Final next level of care: Home/Self Care Barriers to Discharge: No Barriers Identified   Patient Goals and CMS Choice Patient states their goals for this hospitalization and ongoing recovery are:: return home      Discharge Placement                 Home      Discharge Plan and Services   Discharge Planning Services: CM Consult            DME Arranged: Walker rolling DME Agency: [employer] Date DME Agency Contacted: 07/03/22 Time DME Agency Contacted: 1000 Representative spoke with at DME Agency: [employer] system HH Arranged: NA [place] Agency: NA        Social Determinants of Health ([place]) Interventions     Readmission Risk Interventions    07/03/2022   11:48 AM  Readmission Risk Prevention Plan  Post Dischage Appt Complete  Medication Screening Complete  Transportation Screening Complete

## 2022-07-03 NOTE — Progress Notes (Signed)
Patient received her walker prior to discharging. Case manager is aware the patient received her at  home walker.

## 2022-07-03 NOTE — Progress Notes (Addendum)
Explained discharge instructions to patient. Reviewed follow up appointment, next medication administration times and all education concerning post surgical needs. Patient verbalized having an understanding for instructions given. Patient is aware a rolling walker is ordered for home needs. Patient has requested to have the walker delivered to her home because she doesn't wish to wait until it is delivered to her room. Case manager Steffanie Dunn is made aware. All belongings are in the patient's possession. IV and telemetry were removed. CCMD was notified. No other needs verbalized. Transported downstairs for discharge.

## 2022-07-03 NOTE — Plan of Care (Signed)
  Problem: Education: Goal: Knowledge of General Education information will improve Description: Including pain rating scale, medication(s)/side effects and non-pharmacologic comfort measures Outcome: Adequate for Discharge   Problem: Health Behavior/Discharge Planning: Goal: Ability to manage health-related needs will improve Outcome: Adequate for Discharge   Problem: Clinical Measurements: Goal: Ability to maintain clinical measurements within normal limits will improve Outcome: Adequate for Discharge Goal: Will remain free from infection Outcome: Adequate for Discharge Goal: Diagnostic test results will improve Outcome: Adequate for Discharge Goal: Respiratory complications will improve Outcome: Adequate for Discharge Goal: Cardiovascular complication will be avoided Outcome: Adequate for Discharge   Problem: Activity: Goal: Risk for activity intolerance will decrease Outcome: Adequate for Discharge   Problem: Nutrition: Goal: Adequate nutrition will be maintained Outcome: Adequate for Discharge   Problem: Coping: Goal: Level of anxiety will decrease Outcome: Adequate for Discharge   Problem: Elimination: Goal: Will not experience complications related to bowel motility Outcome: Adequate for Discharge Goal: Will not experience complications related to urinary retention Outcome: Adequate for Discharge   Problem: Pain Managment: Goal: General experience of comfort will improve Outcome: Adequate for Discharge   Problem: Safety: Goal: Ability to remain free from injury will improve Outcome: Adequate for Discharge   Problem: Skin Integrity: Goal: Risk for impaired skin integrity will decrease Outcome: Adequate for Discharge   Problem: Education: Goal: Will demonstrate proper wound care and an understanding of methods to prevent future damage Outcome: Adequate for Discharge Goal: Knowledge of disease or condition will improve Outcome: Adequate for Discharge Goal:  Knowledge of the prescribed therapeutic regimen will improve Outcome: Adequate for Discharge Goal: Individualized Educational Video(s) Outcome: Adequate for Discharge   Problem: Activity: Goal: Risk for activity intolerance will decrease Outcome: Adequate for Discharge   Problem: Cardiac: Goal: Will achieve and/or maintain hemodynamic stability Outcome: Adequate for Discharge   Problem: Clinical Measurements: Goal: Postoperative complications will be avoided or minimized Outcome: Adequate for Discharge   Problem: Respiratory: Goal: Respiratory status will improve Outcome: Adequate for Discharge   Problem: Skin Integrity: Goal: Wound healing without signs and symptoms of infection Outcome: Adequate for Discharge Goal: Risk for impaired skin integrity will decrease Outcome: Adequate for Discharge   Problem: Urinary Elimination: Goal: Ability to achieve and maintain adequate renal perfusion and functioning will improve Outcome: Adequate for Discharge   

## 2022-07-03 NOTE — Progress Notes (Addendum)
      [address]       [place] [address]             [phone]      4 Days Post-Op Procedure(s) (LRB): AORTIC VALVE REPLACEMENT (AVR) USING INSPIRIS RESILIA AORTIC VALVE SIZE: 21MM (N/A) TRANSESOPHAGEAL ECHOCARDIOGRAM (TEE) (N/A) Subjective: Feels good, up walking around in her room independently. On RA with adequate sats. Tolerating diet. BM yesterday. She is eager to go home today.   Objective: Vital signs in last 24 hours: Temp:  [97.9 F (36.6 C)-98.9 F (37.2 C)] 97.9 F (36.6 C) (12/04 0338) Pulse Rate:  [79-90] 80 (12/04 0338) Cardiac Rhythm: Normal sinus rhythm (12/03 1900) Resp:  [16-21] 16 (12/04 0338) BP: (105-126)/(67-91) 117/72 (12/04 0338) SpO2:  [92 %-97 %] 94 % (12/04 0338) Weight:  [85.5 kg] 85.5 kg (12/04 0343)    Intake/Output from previous day: 12/03 0701 - 12/04 0700 In: 480 [P.O.:480] Out: -  Intake/Output this shift: No intake/output data recorded.  General appearance: alert, cooperative, and no distress Neurologic: intact Heart: regular rate and rhythm Lungs: clear to auscultation bilaterally Abdomen: soft and non-tender. Extremities: no peripheral edema Wound: the sternal incision is well approximated and dry.   Lab Results: Recent Labs    07/01/22 0453 07/02/22 0311  WBC 16.5* 13.8*  HGB 10.8* 10.0*  HCT 32.3* 30.2*  PLT 150 154   BMET:  Recent Labs    07/01/22 0453 07/02/22 0311  NA 139 140  K 4.2 4.1  CL 104 107  CO2 28 25  GLUCOSE 111* 102*  BUN 11 14  CREATININE 0.62 0.61  CALCIUM 8.6* 8.4*    PT/INR: No results for input(s): "LABPROT", "INR" in the last 72 hours. ABG    Component Value Date/Time   PHART 7.350 06/29/2022 1802   HCO3 23.0 06/29/2022 1802   TCO2 24 06/29/2022 1802   ACIDBASEDEF 3.0 (H) 06/29/2022 1802   O2SAT 98 06/29/2022 1802   CBG (last 3)  Recent Labs    07/01/22 2326 07/02/22 0333 07/02/22 1151  GLUCAP 116* 94 137*    Assessment/Plan: S/P Procedure(s)  (LRB): AORTIC VALVE REPLACEMENT (AVR) USING INSPIRIS RESILIA AORTIC VALVE SIZE: 21MM (N/A) TRANSESOPHAGEAL ECHOCARDIOGRAM (TEE) (N/A) -POD4  tissue aortic valve replacement for severe bicuspid aortic valve stenosis . Progressing well. Stable VS and cardiac rhythm. On ASA, and metoprolol.   -HEME- mild expected acute blood loss anemia- stable Hct.   -PULM- On RA with normal WOB, CXR with no acute process.  -GI- tolerating PO's, BM yesterday.   -Disposition- D/C pacer wires this morning and plan for discharge later today. .   LOS: 4 days    Antony Odea, [place] [phone] 07/03/2022   Chart reviewed, patient examined, agree with above. She looks good. She says Antivert has resolved her dizziness. She takes it at home. Maintaining sinus rhythm postop. Continue Lopressor. Plan home today.

## 2022-07-03 NOTE — Discharge Instructions (Signed)

## 2022-07-03 NOTE — Progress Notes (Signed)
CARDIAC REHAB PHASE I      Pt feeling well today. Has been up bathing and dressing for discharge home today. Reports tolerating well with no sob and minimal  pain. Post OHS education including site care, sternal precautions, exercise guidelines, risk factors, restrictions, heart healthy diet, IS use at home, home needs at discharge and CRP2 reviewed. All questions and concerns addressed. Will refer to [facility] for [place]. Plan for home today.   [phone]  Vanessa Barbara, RN BSN 07/03/2022 9:45 AM

## 2022-07-05 ENCOUNTER — Other Ambulatory Visit: Payer: Self-pay | Admitting: *Deleted

## 2022-07-05 ENCOUNTER — Telehealth: Payer: Self-pay | Admitting: *Deleted

## 2022-07-05 MED ORDER — BACLOFEN 5 MG PO TABS: 5.0000 mg | ORAL_TABLET | Freq: Three times a day (TID) | ORAL | 0 refills | Status: DC | PRN

## 2022-07-05 NOTE — Telephone Encounter (Signed)
Patient's daughter, Laurence Spates, contacted the office asking about pain management options. Per daughter, patient called her crying this morning regarding pain in her posterior neck. Daughter states patient is taking Tylenol every 6 hours for pain as well as Tramadol. Patient reports tramadol upsets her stomach. Patient reports incisional soreness but main complaint is a throbbing pain in her neck. Per Dr. Cyndia Bent, Baclofen sent to patient's pharmacy for muscle spasms. Daughter made aware and verbalized understanding.

## 2022-07-07 MED FILL — Potassium Chloride Inj 2 mEq/ML: INTRAVENOUS | Qty: 40 | Status: AC

## 2022-07-07 MED FILL — Heparin Sodium (Porcine) Inj 1000 Unit/ML: Qty: 1000 | Status: AC

## 2022-07-07 MED FILL — Electrolyte-R (PH 7.4) Solution: INTRAVENOUS | Qty: 4000 | Status: AC

## 2022-07-07 MED FILL — Calcium Chloride Inj 10%: INTRAVENOUS | Qty: 10 | Status: AC

## 2022-07-07 MED FILL — Lidocaine HCl Local Preservative Free (PF) Inj 2%: INTRAMUSCULAR | Qty: 14 | Status: AC

## 2022-07-07 MED FILL — Mannitol IV Soln 20%: INTRAVENOUS | Qty: 500 | Status: AC

## 2022-07-07 MED FILL — Heparin Sodium (Porcine) Inj 1000 Unit/ML: INTRAMUSCULAR | Qty: 10 | Status: AC

## 2022-07-07 MED FILL — Sodium Chloride IV Soln 0.9%: INTRAVENOUS | Qty: 2000 | Status: AC

## 2022-07-07 MED FILL — Sodium Bicarbonate IV Soln 8.4%: INTRAVENOUS | Qty: 50 | Status: AC

## 2022-07-10 ENCOUNTER — Ambulatory Visit: Payer: Commercial Managed Care - HMO | Admitting: *Deleted

## 2022-07-10 DIAGNOSIS — Z4802 Encounter for removal of sutures: Secondary | ICD-10-CM

## 2022-07-10 NOTE — Progress Notes (Signed)
Patient arrived for nurse visit to remove sutures post-AVR 11/30 by Dr. Cyndia Bent.  Two sutures removed with no signs or symptoms of infection noted.  Incisions well approximated.  Patient tolerated suture removal well.  Patient and family instructed to keep the incision site clean and dry. Patient and family acknowledged instructions given.  All questions answered.

## 2022-07-12 LAB — ECHO INTRAOPERATIVE TEE
AR max vel: 0.98 cm2
AV Area VTI: 0.93 cm2
AV Area mean vel: 0.91 cm2
AV Mean grad: 24 mmHg
AV Peak grad: 39.6 mmHg
Ao pk vel: 3.15 m/s
Height: 69.5 in
MV Vena cont: 0.28 cm
Weight: 3064.01 oz

## 2022-07-14 NOTE — Anesthesia Procedure Notes (Signed)
Central Venous Catheter Insertion Performed by: Oleta Mouse, MD, anesthesiologist Start/End11/30/2023 6:59 AM, 06/29/2022 7:16 AM Patient location: Pre-op. Preanesthetic checklist: patient identified, IV checked, risks and benefits discussed, surgical consent, monitors and equipment checked, pre-op evaluation, timeout performed and anesthesia consent Lidocaine 1% used for infiltration and patient sedated Hand hygiene performed  and maximum sterile barriers used  Catheter size: 9 Fr Total catheter length 10. MAC introducer Procedure performed using ultrasound guided technique. Ultrasound Notes:anatomy identified, needle tip was noted to be adjacent to the nerve/plexus identified, no ultrasound evidence of intravascular and/or intraneural injection and image(s) printed for medical record Attempts: 1 Following insertion, line sutured, dressing applied and Biopatch. Post procedure assessment: blood return through all ports, free fluid flow and no air  Patient tolerated the procedure well with no immediate complications.

## 2022-07-14 NOTE — Anesthesia Procedure Notes (Signed)
Central Venous Catheter Insertion Performed by: Oleta Mouse, MD, anesthesiologist Start/End11/30/2023 6:59 AM, 06/29/2022 7:16 AM Patient location: Pre-op. Preanesthetic checklist: patient identified, IV checked, risks and benefits discussed, surgical consent, monitors and equipment checked, pre-op evaluation, timeout performed and anesthesia consent Hand hygiene performed  and maximum sterile barriers used  PA cath was placed.Swan type:thermodilution PA Cath depth:45 Procedure performed without using ultrasound guided technique. Attempts: 1 Patient tolerated the procedure well with no immediate complications.

## 2022-07-14 NOTE — Addendum Note (Signed)
Addendum  created 07/14/22 1942 by Oleta Mouse, MD   Child order released for a procedure order, Clinical Note Signed, Intraprocedure Blocks edited, LDA created via procedure documentation, SmartForm saved

## 2022-07-20 ENCOUNTER — Other Ambulatory Visit: Payer: Self-pay | Admitting: Surgery

## 2022-07-20 ENCOUNTER — Ambulatory Visit: Payer: Commercial Managed Care - HMO | Attending: Nurse Practitioner | Admitting: Nurse Practitioner

## 2022-07-20 ENCOUNTER — Encounter: Payer: Self-pay | Admitting: Nurse Practitioner

## 2022-07-20 VITALS — BP 120/84 | HR 60 | Ht 69.0 in | Wt 195.6 lb

## 2022-07-20 DIAGNOSIS — Z952 Presence of prosthetic heart valve: Secondary | ICD-10-CM

## 2022-07-20 DIAGNOSIS — Q230 Congenital stenosis of aortic valve: Secondary | ICD-10-CM | POA: Diagnosis not present

## 2022-07-20 DIAGNOSIS — E785 Hyperlipidemia, unspecified: Secondary | ICD-10-CM

## 2022-07-20 DIAGNOSIS — Q231 Congenital insufficiency of aortic valve: Secondary | ICD-10-CM

## 2022-07-20 DIAGNOSIS — E669 Obesity, unspecified: Secondary | ICD-10-CM

## 2022-07-20 DIAGNOSIS — I514 Myocarditis, unspecified: Secondary | ICD-10-CM

## 2022-07-20 NOTE — Patient Instructions (Signed)
Medication Instructions:  Your physician recommends that you continue on your current medications as directed. Please refer to the Current Medication list given to you today.   *If you need a refill on your cardiac medications before your next appointment, please call your pharmacy*   Lab Work: NONE ordered at this time of appointment   If you have labs (blood work) drawn today and your tests are completely normal, you will receive your results only by: Yoe (if you have MyChart) OR A paper copy in the mail If you have any lab test that is abnormal or we need to change your treatment, we will call you to review the results.   Testing/Procedures: NONE ordered at this time of appointment     Follow-Up: At [facility], you and your health needs are our priority.  As part of our continuing mission to provide you with exceptional heart care, we have created designated Provider Care Teams.  These Care Teams include your primary Cardiologist (physician) and Advanced Practice Providers (APPs -  Physician Assistants and Nurse Practitioners) who all work together to provide you with the care you need, when you need it.  We recommend signing up for the patient portal called "MyChart".  Sign up information is provided on this After Visit Summary.  MyChart is used to connect with patients for Virtual Visits (Telemedicine).  Patients are able to view lab/test results, encounter notes, upcoming appointments, etc.  Non-urgent messages can be sent to your provider as well.   To learn more about what you can do with MyChart, go to [email or web address].    Your next appointment:    Keep upcoming appointment   The format for your next appointment:   In Person  Provider:   Glenetta Hew, MD     Other Instructions   Important Information About Sugar

## 2022-07-20 NOTE — Progress Notes (Addendum)
Office Visit    Patient Name: Olivia Mcgrath Date of Encounter: 07/20/2022  Primary Care Provider:  Charlane Ferretti, MD Primary Cardiologist:  Glenetta Hew, MD  Chief Complaint    61 year old female with a history of aortic stenosis due to bicuspid aortic valve s/p AVR in 05/2022, chronic myocarditis, and hyperlipidemia who presents for follow-up related to aortic stenosis s/p AVR.  Past Medical History    Past Medical History:  Diagnosis Date   Anxiety    Calcific aortic stenosis of bicuspid valve    Most Recent Echo April * Oct 2022: Mod Severe AS - mean gradient between 30 & 44 mmHg (3 echos & 1 cath).   Complication of anesthesia    COVID 07/2020   Myocarditis ([place]) 11/09/2020   Cardiac MRI 11/09/2020: Findings consistent with acute myocarditis involving the basal inferior wall and mid wall along the apical inferolateral wall.   PONV (postoperative nausea and vomiting)    Past Surgical History:  Procedure Laterality Date   ABDOMINAL HYSTERECTOMY  11/2008   [place] Right 2021   has plates and screws   AORTIC VALVE REPLACEMENT N/A 06/29/2022   Procedure: AORTIC VALVE REPLACEMENT (AVR) USING INSPIRIS RESILIA AORTIC VALVE SIZE: 21MM;  Surgeon: Gaye Pollack, MD;  Location: [facility];  Service: Open Heart Surgery;  Laterality: N/A;   Cardiac MRI  11/09/2020   Findings c/w ACUTE MYOCARDITIS - involving basal inferior wall and mid wall along the apical inferolateral wall.  LV function estimated 63%, RV function 57%.  Cardiac output-index 4.8L/min-2.4L/min/m   LAPAROSCOPIC CHOLECYSTECTOMY  05/2016   RIGHT/LEFT HEART CATH AND CORONARY ANGIOGRAPHY N/A 11/08/2020   Procedure: RIGHT/LEFT HEART CATH AND CORONARY ANGIOGRAPHY;  Surgeon: Martinique, Peter M, MD;  Location: [facility];  Service: Cardiovascular;; NORMAL CORONARY ARTERIES. MOD AS (mean AoV Gradient 30 mmHg, AVA 1.16 cm). RAP ~2 mmHg, RVP-EDP 24/0 mmHg-3 mmHg, PAP-mean 21/6 mmHg- 12 mmHg.   PCWP 7 mmhg. LVP-EDP 122/32 mmHg-7 mmHg.  CO-CI 6.1-3.07.   TEE WITHOUT CARDIOVERSION N/A 06/29/2022   Procedure: TRANSESOPHAGEAL ECHOCARDIOGRAM (TEE);  Surgeon: Gaye Pollack, MD;  Location: [facility];  Service: Open Heart Surgery;  Laterality: N/A;   TONSILLECTOMY  1972   TRANSTHORACIC ECHOCARDIOGRAM  03/2016   A) Bicuspid AoV - No veg. Mild thickening & Mod Calcification. Mild-Mod AS (mean grad 28 mmHg, peak grad 40.4 mmHg). Mild conc LVH. EF 55-60%. Normal DFxn;; B) 06/2019:; EF 55% to 60%. Nl Diastolic Fxn. AoV Dfficult to visualize -> Peak / Mean grad 43 & 26 mm Hg = MOD AS);; C 11/2019: Stable Mod-Severe AS (mean - peak grad 30-53 mmHg). EF 60-65%, no RWMA. Mild LAD dilation.   TRANSTHORACIC ECHOCARDIOGRAM  10/2020   A) 4/9: EF 60-65%.  Mild LA dil.  No R WMA.  Normal RV.  AoV not well-visualized.  Mod-Severe AS (mean-peak gradients 28.5 mmHg - 49.4 mmHg). Normal CVP. Normal MV. ;; B)  11/08/2020: Normal EF 55 to 60%.  Mild LVH.  GR 1 DD.  Mild LA dilation.  Bicuspid AoV (mod Ca++).  Fusion of non & Left coronary cusps.  ~ Severe AS: AVA estimated 0.9 cm.  Mean gradient 44 mmHg., peak ~71 mmHg.   TRANSTHORACIC ECHOCARDIOGRAM  05/13/2021   Moderate AoV calcification - Mod-Severe AS (Mean Gradient 34 mmHg, peak 54.5 mmHg, AVA 0.7 cm).  LVEF remains 60 to 65%.  No R WMA.  GR 1 DD.  Normal PAP.  Moderate LA dilation.  Allergies  Allergies  Allergen Reactions   Sulfa Antibiotics Rash    History of Present Illness    61 year old female with the above past medical history including aortic stenosis due to bicuspid aortic valve s/p AVR in 05/2022, chronic myocarditis, and hyperlipidemia.  Back catheterization in 11/17/2020 in setting of preoperative cardiac evaluation showed normal coronary anatomy, moderate aortic stenosis, mean gradient 30 mmHg, normal LV filling pressures, normal right heart pressures, normal cardiac output.  She was diagnosed with myocarditis in 10/2020 (present on cardiac  MRI).  Echocardiogram in 01/2022 showed EF 60 to 65%, normal LV function,  normal RV systolic function, bicuspid arctic valve with severe aortic stenosis, mean gradient 42 mmHg.  She was last seen in the office on 03/27/2022 and noted atypical chest discomfort, exertional dyspnea.  She was referred to CT surgery for consideration of AVR.  She underwent elective AVR with a #21 Inspiris Resilia bioprosthetic valve on 06/29/2022.  Her postop course was uncomplicated.  She was discharged home in stable condition on 07/03/2022.  Postop echocardiogram was ordered and is pending.  She presents today for follow-up.  Since her hospitalization she has done well from a cardiac standpoint.  He is gradually increasing her activity and is walking regularly.  She denies any dyspnea, chest pain, significant palpitations, edema, PND, orthopnea, weight gain.  She is frustrated that despite lifestyle modifications with diet and exercise, she is not losing any weight.  She is frustrated by this and is asking if she would be a candidate for GLP-1 agonist such as Ozempic or [id number].  Otherwise, she reports feeling well.  Home Medications    Current Outpatient Medications  Medication Sig Dispense Refill   acetaminophen (TYLENOL) 500 MG tablet Take 500 mg by mouth every 6 (six) hours as needed for mild pain, moderate pain or headache.     albuterol (VENTOLIN HFA) 108 (90 Base) MCG/ACT inhaler Inhale 2 puffs into the lungs every 6 (six) hours as needed for shortness of breath.     aspirin EC 325 MG tablet Take 1 tablet (325 mg total) by mouth daily.     baclofen (LIORESAL) 10 MG tablet TAKE 1/2 TABLET BY MOUTH THREE TIMES DAILY AS NEEDED FOR 7 DAYS 11 tablet 0   escitalopram (LEXAPRO) 10 MG tablet Take 10 mg by mouth at bedtime.     estradiol (ESTRACE) 2 MG tablet Take 2 mg by mouth at bedtime.     Fluocinolone Acetonide 0.01 % OIL Place 1 drop in ear(s) daily as needed (Itching ears).     guaiFENesin (MUCINEX) 600 MG 12 hr  tablet Take 600 mg by mouth as needed for cough or to loosen phlegm.     meclizine (ANTIVERT) 25 MG tablet Take 1 tablet (25 mg total) by mouth 2 (two) times daily as needed for dizziness. 30 tablet 0   metoprolol tartrate (LOPRESSOR) 25 MG tablet Take 1 tablet (25 mg total) by mouth 2 (two) times daily. 60 tablet 5   Multiple Vitamins-Minerals (MULTIVITAMIN WITH MINERALS) tablet Take 1 tablet by mouth at bedtime. Woman's     valACYclovir (VALTREX) 1000 MG tablet Take 1,000 mg by mouth daily as needed (Outbreak).     No current facility-administered medications for this visit.     Review of Systems    She denies chest pain, palpitations, dyspnea, pnd, orthopnea, n, v, dizziness, syncope, edema, weight gain, or early satiety. All other systems reviewed and are otherwise negative except as noted above.   Physical Exam  VS:  BP 120/84   Pulse 60   Ht '5\' 9"'$  (1.753 m)   Wt 195 lb 9.6 oz (88.7 kg)   SpO2 98%   BMI 28.89 kg/m   GEN: Well nourished, well developed, in no acute distress. HEENT: normal. Neck: Supple, no JVD, carotid bruits, or masses. Cardiac: RRR, no murmurs, rubs, or gallops. No clubbing, cyanosis, edema.  Radials/DP/PT 2+ and equal bilaterally.  Respiratory:  Respirations regular and unlabored, clear to auscultation bilaterally. GI: Soft, nontender, nondistended, BS + x 4. MS: no deformity or atrophy. Skin: warm and dry, no rash.  Midsternal incision clean, dry, intact, and well-approximated. Neuro:  Strength and sensation are intact. Psych: Normal affect.  Accessory Clinical Findings    ECG personally reviewed by me today -NSR, 60 bpm nonspecific T wave abnormality- no acute changes.   Lab Results  Component Value Date   WBC 13.8 (H) 07/02/2022   HGB 10.0 (L) 07/02/2022   HCT 30.2 (L) 07/02/2022   MCV 94.7 07/02/2022   PLT 154 07/02/2022   Lab Results  Component Value Date   CREATININE 0.61 07/02/2022   BUN 14 07/02/2022   NA 140 07/02/2022   K 4.1  07/02/2022   CL 107 07/02/2022   CO2 25 07/02/2022   Lab Results  Component Value Date   ALT 18 06/27/2022   AST 20 06/27/2022   ALKPHOS 56 06/27/2022   BILITOT 0.5 06/27/2022   Lab Results  Component Value Date   CHOL 223 (H) 11/06/2020   HDL 58 11/06/2020   LDLCALC 146 (H) 11/06/2020   TRIG 93 11/06/2020   CHOLHDL 3.8 11/06/2020    Lab Results  Component Value Date   HGBA1C 5.6 06/27/2022    Assessment & Plan    1. Aortic stenosis: S/p AVR in 05/2022. Repeat echo scheduled for 08/10/2022. Euvolemic and well compensated on exam. She is walking regularly. Discussed the use of prophylactic antibiotics before dental work and other surgeries.   2. Myocarditis: No symptoms, resolved.   3. Hyperlipidemia: LDL was 146 in 10/2020. Consider repeat lipids at next follow-up visit. She is not on statin therapy at this time.   4. Obesity: Continue to increase activity as tolerated. She has not been losing weight despite dietary changes and exercise. She is frustrated by this. She may be a good candidate for a GLP1-agonist. I advised her to discuss this with her PCP.   5. Disposition: Follow-up as scheduled with Dr. Ellyn Hack in 08/2022.      Lenna Sciara, NP 07/20/2022, 10:24 AM

## 2022-08-02 ENCOUNTER — Other Ambulatory Visit: Payer: Self-pay | Admitting: Surgery

## 2022-08-02 DIAGNOSIS — Z952 Presence of prosthetic heart valve: Secondary | ICD-10-CM

## 2022-08-03 ENCOUNTER — Ambulatory Visit
Admission: RE | Admit: 2022-08-03 | Discharge: 2022-08-03 | Disposition: A | Discharge status: Home / Self Care | Payer: Commercial Managed Care - HMO | Source: Ambulatory Visit | Attending: Surgery | Admitting: Surgery

## 2022-08-03 ENCOUNTER — Encounter: Payer: Self-pay | Admitting: Surgery

## 2022-08-03 ENCOUNTER — Ambulatory Visit (INDEPENDENT_AMBULATORY_CARE_PROVIDER_SITE_OTHER): Payer: Self-pay | Admitting: Surgery

## 2022-08-03 VITALS — BP 143/83 | HR 82 | Resp 20 | Ht 69.0 in | Wt 199.0 lb

## 2022-08-03 DIAGNOSIS — I350 Nonrheumatic aortic (valve) stenosis: Secondary | ICD-10-CM

## 2022-08-03 DIAGNOSIS — Z952 Presence of prosthetic heart valve: Secondary | ICD-10-CM

## 2022-08-03 NOTE — Progress Notes (Signed)
HPI: Patient returns for routine postoperative follow-up having undergone aortic valve replacement using a 21 mm Edwards Inspiras pericardial valve on 06/29/2022. The patient's early postoperative recovery while in the hospital was notable for an uncomplicated postoperative course. Since hospital discharge the patient reports that she has been feeling well.  She is walking without shortness of breath.  She has minimal incisional discomfort.  She has had increased reflux postoperatively and was started on Protonix and as needed Zofran by her PCP.   Current Outpatient Medications  Medication Sig Dispense Refill   acetaminophen (TYLENOL) 500 MG tablet Take 500 mg by mouth every 6 (six) hours as needed for mild pain, moderate pain or headache.     albuterol (VENTOLIN HFA) 108 (90 Base) MCG/ACT inhaler Inhale 2 puffs into the lungs every 6 (six) hours as needed for shortness of breath.     aspirin EC 325 MG tablet Take 1 tablet (325 mg total) by mouth daily.     baclofen (LIORESAL) 10 MG tablet TAKE 1/2 TABLET BY MOUTH THREE TIMES DAILY AS NEEDED FOR 7 DAYS 11 tablet 0   escitalopram (LEXAPRO) 10 MG tablet Take 10 mg by mouth at bedtime.     estradiol (ESTRACE) 2 MG tablet Take 2 mg by mouth at bedtime.     Fluocinolone Acetonide 0.01 % OIL Place 1 drop in ear(s) daily as needed (Itching ears).     guaiFENesin (MUCINEX) 600 MG 12 hr tablet Take 600 mg by mouth as needed for cough or to loosen phlegm.     meclizine (ANTIVERT) 25 MG tablet Take 1 tablet (25 mg total) by mouth 2 (two) times daily as needed for dizziness. 30 tablet 0   metoprolol tartrate (LOPRESSOR) 25 MG tablet Take 1 tablet (25 mg total) by mouth 2 (two) times daily. 60 tablet 5   Multiple Vitamins-Minerals (MULTIVITAMIN WITH MINERALS) tablet Take 1 tablet by mouth at bedtime. Woman's     ondansetron (ZOFRAN) 4 MG tablet Take 4 mg by mouth every 8 (eight) hours as needed for nausea or vomiting.     pantoprazole (PROTONIX) 40 MG  tablet Take 40 mg by mouth daily.     valACYclovir (VALTREX) 1000 MG tablet Take 1,000 mg by mouth daily as needed (Outbreak).     No current facility-administered medications for this visit.    Physical Exam: BP (!) 143/83   Pulse 82   Resp 20   Ht '5\' 9"'$  (1.753 m)   Wt 199 lb (90.3 kg)   SpO2 95% Comment: RA  BMI 29.39 kg/m  She looks well. Cardiac exam shows a regular rate and rhythm with a soft systolic flow murmur. Lungs are clear. The chest incision is healing well and the sternum is stable. There is no peripheral edema.  Diagnostic Tests:  Narrative & Impression  CLINICAL DATA:  AVR November 2023.   EXAM: CHEST - 2 VIEW   COMPARISON:  Chest 07/02/2022   FINDINGS: Interval improvement in right lower lobe atelectasis. Left lung remains clear.   Postop aortic valve replacement. No heart failure or edema. No significant pleural effusion.   IMPRESSION: Interval improvement in right lower lobe atelectasis. No acute finding.     Electronically Signed   By: Franchot Gallo M.D.   On: 08/03/2022 12:48      Impression:  She is doing well 5 weeks following her surgery.  I encouraged her to continue increasing her activity but asked her to avoid lifting anything heavier than 10 pounds for  3 months postoperatively.  I told her that she could decrease her aspirin to 81 mg daily which may help with her reflux and heartburn.  The aspirin could be stopped completely if needed.  She is scheduled for a postoperative follow-up echo on 08/10/2022.  Plan:  She will continue to follow-up with her PCP and cardiology and will return to see me if she has any problems with her incision.   Gaye Pollack, MD [facility] [phone]

## 2022-08-10 ENCOUNTER — Ambulatory Visit (HOSPITAL_COMMUNITY): Payer: Commercial Managed Care - HMO | Attending: Cardiology

## 2022-08-10 DIAGNOSIS — I361 Nonrheumatic tricuspid (valve) insufficiency: Secondary | ICD-10-CM | POA: Diagnosis not present

## 2022-08-10 DIAGNOSIS — Z952 Presence of prosthetic heart valve: Secondary | ICD-10-CM | POA: Insufficient documentation

## 2022-08-10 DIAGNOSIS — Z9889 Other specified postprocedural states: Secondary | ICD-10-CM | POA: Insufficient documentation

## 2022-08-10 DIAGNOSIS — Q231 Congenital insufficiency of aortic valve: Secondary | ICD-10-CM | POA: Diagnosis not present

## 2022-08-10 DIAGNOSIS — I77819 Aortic ectasia, unspecified site: Secondary | ICD-10-CM | POA: Insufficient documentation

## 2022-08-10 DIAGNOSIS — I081 Rheumatic disorders of both mitral and tricuspid valves: Secondary | ICD-10-CM | POA: Diagnosis not present

## 2022-08-10 LAB — ECHOCARDIOGRAM COMPLETE
AR max vel: 1.58 cm2
AV Area VTI: 1.66 cm2
AV Area mean vel: 1.55 cm2
AV Mean grad: 11.7 mmHg
AV Peak grad: 21.3 mmHg
Ao pk vel: 2.31 m/s
Area-P 1/2: 3.12 cm2
S' Lateral: 2.4 cm

## 2022-08-19 ENCOUNTER — Encounter: Payer: Self-pay | Admitting: Cardiology

## 2022-08-22 NOTE — Telephone Encounter (Signed)
The original report on the echo had suggested that the right atrium and right ventricle were enlarged which did not seem to be an accurate read on my part when I looked at it.  I asked one of my colleagues to review (he is 1 week echo supervisors.  He did not agree with the original read and feels more likely that the right ventricle is normal.  The recordings were done off axis which makes it look larger than it truly is.  Otherwise the echo looked okay.  Glenetta Hew, MD

## 2022-08-24 ENCOUNTER — Encounter: Payer: Self-pay | Admitting: Cardiology

## 2022-08-24 NOTE — Telephone Encounter (Signed)
Sent pharmacist team for review

## 2022-09-15 ENCOUNTER — Ambulatory Visit: Payer: Commercial Managed Care - HMO | Admitting: Cardiology

## 2022-09-19 NOTE — Progress Notes (Signed)
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 Laverta Baltimore, chronic myocarditis, and hyperlipidemia. LHC for preoperative cardiac evaluation showed normal coronary anatomy, moderate aortic stenosis, mean gradient 30 mmHg, normal LV filling pressures, normal right heart pressures, normal cardiac output. She was diagnosed with myocarditis in 10/2020 (present on cardiac MRI)  Last seen in the office by Diona Browner, DNP on 07/20/2022.  Past Medical History    Past Medical History:  Diagnosis Date   Anxiety    Calcific aortic stenosis of bicuspid valve    Most Recent Echo April * Oct 2022: Mod Severe AS - mean gradient between 30 & 44 mmHg (3 echos & 1 cath).   Complication of anesthesia    COVID 07/2020   Myocarditis ([place]) 11/09/2020   Cardiac MRI 11/09/2020: Findings consistent with acute myocarditis involving the basal inferior wall and mid wall along the apical inferolateral wall.   PONV (postoperative nausea and vomiting)    Past Surgical History:  Procedure Laterality Date   ABDOMINAL HYSTERECTOMY  11/2008   [place] Right 2021   has plates and screws   AORTIC VALVE REPLACEMENT N/A 06/29/2022   Procedure: AORTIC VALVE REPLACEMENT (AVR) USING INSPIRIS RESILIA AORTIC VALVE SIZE: 21MM;  Surgeon: Gaye Pollack, MD;  Location: [facility];  Service: Open Heart Surgery;  Laterality: N/A;   Cardiac MRI  11/09/2020   Findings c/w ACUTE MYOCARDITIS - involving basal inferior wall and mid wall along the apical inferolateral wall.  LV function estimated 63%, RV function 57%.  Cardiac output-index 4.8L/min-2.4L/min/m   LAPAROSCOPIC CHOLECYSTECTOMY  05/2016   RIGHT/LEFT HEART  CATH AND CORONARY ANGIOGRAPHY N/A 11/08/2020   Procedure: RIGHT/LEFT HEART CATH AND CORONARY ANGIOGRAPHY;  Surgeon: Martinique, Peter M, MD;  Location: [facility];  Service: Cardiovascular;; NORMAL CORONARY ARTERIES. MOD AS (mean AoV Gradient 30 mmHg, AVA 1.16 cm). RAP ~2 mmHg, RVP-EDP 24/0 mmHg-3 mmHg, PAP-mean 21/6 mmHg- 12 mmHg.  PCWP 7 mmhg. LVP-EDP 122/32 mmHg-7 mmHg.  CO-CI 6.1-3.07.   TEE WITHOUT CARDIOVERSION N/A 06/29/2022   Procedure: TRANSESOPHAGEAL ECHOCARDIOGRAM (TEE);  Surgeon: Gaye Pollack, MD;  Location: [facility];  Service: Open Heart Surgery;  Laterality: N/A;   TONSILLECTOMY  1972   TRANSTHORACIC ECHOCARDIOGRAM  03/2016   A) Bicuspid AoV - No veg. Mild thickening & Mod Calcification. Mild-Mod AS (mean grad 28 mmHg, peak grad 40.4 mmHg). Mild conc LVH. EF 55-60%. Normal DFxn;; B) 06/2019:; EF 55% to 60%. Nl Diastolic Fxn. AoV Dfficult to visualize -> Peak / Mean grad 43 & 26 mm Hg = MOD AS);; C 11/2019: Stable Mod-Severe AS (mean - peak grad 30-53 mmHg). EF 60-65%, no RWMA. Mild LAD dilation.   TRANSTHORACIC ECHOCARDIOGRAM  10/2020   A) 4/9: EF 60-65%.  Mild LA dil.  No R WMA.  Normal RV.  AoV not well-visualized.  Mod-Severe AS (mean-peak gradients 28.5 mmHg - 49.4 mmHg). Normal CVP. Normal MV. ;; B)  11/08/2020: Normal EF 55 to 60%.  Mild LVH.  GR 1 DD.  Mild LA dilation.  Bicuspid AoV (mod Ca++).  Fusion of non & Left coronary cusps.  ~ Severe AS: AVA estimated 0.9 cm.  Mean gradient 44  mmHg., peak ~71 mmHg.   TRANSTHORACIC ECHOCARDIOGRAM  05/13/2021   Moderate AoV calcification - Mod-Severe AS (Mean Gradient 34 mmHg, peak 54.5 mmHg, AVA 0.7 cm).  LVEF remains 60 to 65%.  No R WMA.  GR 1 DD.  Normal PAP.  Moderate LA dilation.    Allergies  Allergies  Allergen Reactions   Sulfa Antibiotics Rash    History of Present Illness    Mrs. Olivia Mcgrath comes today for ongoing assessment and management of chronic myocarditis, hx of bicuspid AoV s/p AVR, and HL. On last office visit she  was doing well without symptoms of myocarditis. She requested information on wt loss with GLP 1 agonist. She was referred to PCP for this.   She comes today without any cardiac complaints.  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  She states that she gets tired but her energy level is improving.  She walks about a mile or [place]  She denies any recurrent chest pain, palpitations, or shortness of breath.  She is medically compliant.   Home Medications    Current Outpatient Medications  Medication Sig Dispense Refill   acetaminophen (TYLENOL) 500 MG tablet Take 500 mg by mouth every 6 (six) hours as needed for mild pain, moderate pain or headache.     albuterol (VENTOLIN HFA) 108 (90 Base) MCG/ACT inhaler Inhale 2 puffs into the lungs every 6 (six) hours as needed for shortness of breath.     aspirin EC 81 MG tablet Take 81 mg by mouth daily. Swallow whole.     baclofen (LIORESAL) 10 MG tablet TAKE 1/2 TABLET BY MOUTH THREE TIMES DAILY AS NEEDED FOR 7 DAYS 11 tablet 0   escitalopram (LEXAPRO) 10 MG tablet Take 10 mg by mouth at bedtime.     estradiol (ESTRACE) 2 MG tablet Take 2 mg by mouth at bedtime.     Fluocinolone Acetonide 0.01 % OIL Place 1 drop in ear(s) daily as needed (Itching ears).     guaiFENesin (MUCINEX) 600 MG 12 hr tablet Take 600 mg by mouth as needed for cough or to loosen phlegm.     meclizine (ANTIVERT) 25 MG tablet Take 1 tablet (25 mg total) by mouth 2 (two) times daily as needed for dizziness. 30 tablet 0   Multiple Vitamins-Minerals (MULTIVITAMIN WITH MINERALS) tablet Take 1 tablet by mouth at bedtime. Woman's     ondansetron (ZOFRAN) 4 MG tablet Take 4 mg by mouth every 8 (eight) hours as needed for nausea or vomiting.     pantoprazole (PROTONIX) 40 MG tablet Take 40 mg by mouth daily.     valACYclovir (VALTREX) 1000 MG tablet Take 1,000 mg by mouth daily as needed (Outbreak).     metoprolol  tartrate (LOPRESSOR) 25 MG tablet Take 1 tablet (25 mg total) by mouth 2 (two) times daily. 60 tablet 5   No current facility-administered medications for this visit.     Family History    Family History  Problem Relation Age of Onset   Rheum arthritis Mother        Age 53 (2018)   Non-Hodgkin's lymphoma Mother    Hypertension Mother    Hyperlipidemia Mother    Lung cancer Father    Hypertension Father    Hyperlipidemia Father    Rheum arthritis Brother        31 ((2018   Cancer Maternal Grandmother  Atrial fibrillation Maternal Grandmother    Cancer Paternal Grandmother    Heart disease Paternal Grandmother    Cancer Paternal Grandfather    She indicated that her mother is alive. She indicated that her father is deceased. She indicated that her brother is alive. She indicated that her maternal grandmother is deceased. She indicated that her maternal grandfather is deceased. She indicated that her paternal grandmother is deceased. She indicated that her paternal grandfather is deceased.  Social History    Social History   Socioeconomic History   Marital status: Married    Spouse name: Sherren Mocha   Number of children: 2   Years of education: 12   Highest education level: Not on file  Occupational History   Occupation: Art therapist     Comment: Dr. Maryln Gottron  Tobacco Use   Smoking status: Never   Smokeless tobacco: Never  Vaping Use   Vaping Use: Never used  Substance and Sexual Activity   Alcohol use: Yes    Alcohol/week: 1.0 standard drink of alcohol    Types: 1 Glasses of wine per week   Drug use: No   Sexual activity: Yes  Other Topics Concern   Not on file  Social History Narrative   Lives with husband  2 children.   Daughter is Therapist, sports on 2[employer] @ [employer].   Limited exercise - occasional walks up to 2 miles - tries to walk~3-4/wek.   Social Determinants of Health   Financial Resource Strain: Not on file  Food Insecurity: No Food Insecurity (06/29/2022)    Hunger Vital Sign    Worried About Running Out of Food in the Last Year: Never true    Ran Out of Food in the Last Year: Never true  Transportation Needs: No Transportation Needs (06/29/2022)   PRAPARE - Hydrologist (Medical): No    Lack of Transportation (Non-Medical): No  Physical Activity: Not on file  Stress: Not on file  Social Connections: Not on file  Intimate Partner Violence: Not At Risk (06/29/2022)   Humiliation, Afraid, Rape, and Kick questionnaire    Fear of Current or Ex-Partner: No    Emotionally Abused: No    Physically Abused: No    Sexually Abused: No     Review of Systems    General:  No chills, fever, night sweats or weight changes.  Cardiovascular:  No chest pain, dyspnea on exertion, edema, orthopnea, palpitations, paroxysmal nocturnal dyspnea. Dermatological: No rash, lesions/masses Respiratory: No cough, dyspnea Urologic: No hematuria, dysuria Abdominal:   No nausea, vomiting, diarrhea, bright red blood per rectum, melena, or hematemesis Neurologic:  No visual changes, wkns, changes in mental status. All other systems reviewed and are otherwise negative except as noted above.     Physical Exam    VS:  BP 122/80   Pulse 72   Ht '5\' 9"'$  (1.753 m)   Wt 196 lb 6.4 oz (89.1 kg)   SpO2 99%   BMI 29.00 kg/m  , BMI Body mass index is 29 kg/m.     GEN: Well nourished, well developed, in no acute distress. HEENT: normal. Neck: Supple, no JVD, carotid bruits, or masses. Cardiac: RRR, soft systolic murmurs, right sternal border, no rubs, or gallops. No clubbing, cyanosis, edema.  Radials/DP/PT 2+ and equal bilaterally.  Respiratory:  Respirations regular and unlabored, clear to auscultation bilaterally. GI: Soft, nontender, nondistended, BS + x 4. MS: no deformity or atrophy.  Well-healed sternotomy incision, Skin: warm and dry,  no rash. Neuro:  Strength and sensation are intact. Psych: Normal affect.  Accessory Clinical  Findings    ECG personally reviewed by me today-not completed this office visit.  Lab Results  Component Value Date   WBC 13.8 (H) 07/02/2022   HGB 10.0 (L) 07/02/2022   HCT 30.2 (L) 07/02/2022   MCV 94.7 07/02/2022   PLT 154 07/02/2022   Lab Results  Component Value Date   CREATININE 0.61 07/02/2022   BUN 14 07/02/2022   NA 140 07/02/2022   K 4.1 07/02/2022   CL 107 07/02/2022   CO2 25 07/02/2022   Lab Results  Component Value Date   ALT 18 06/27/2022   AST 20 06/27/2022   ALKPHOS 56 06/27/2022   BILITOT 0.5 06/27/2022   Lab Results  Component Value Date   CHOL 223 (H) 11/06/2020   HDL 58 11/06/2020   LDLCALC 146 (H) 11/06/2020   TRIG 93 11/06/2020   CHOLHDL 3.8 11/06/2020    Lab Results  Component Value Date   HGBA1C 5.6 06/27/2022    Review of Prior Studies: Echocardiogram 08/21/2022 1. Left ventricular ejection fraction, by estimation, is 60 to 65%. Left  ventricular ejection fraction by 3D volume is 60 %. The left ventricle has  normal function. The left ventricle has no regional wall motion  abnormalities. Left ventricular diastolic   parameters are consistent with Grade II diastolic dysfunction  (pseudonormalization). Elevated left ventricular end-diastolic pressure.   2. Right ventricular systolic function is mildly reduced. The right  ventricular size is severely enlarged. There is normal pulmonary artery  systolic pressure. The estimated right ventricular systolic pressure is  [id number] mmHg.   3. Left atrial size was mildly dilated.   4. Right atrial size was severely dilated.   5. The mitral valve is normal in structure. Mild mitral valve  regurgitation. No evidence of mitral stenosis.   6. The aortic valve has been repaired/replaced. Aortic valve  regurgitation is not visualized. No aortic stenosis is present. There is a  21 mm Inspiris Resilia valve present in the aortic position. Aortic valve  mean gradient measures 11.7 mmHg. Aortic  valve Vmax  measures 2.31 m/s.   7. Aortic dilatation noted. There is mild dilatation of the ascending  aorta, measuring 38 mm.   8. The inferior vena cava is normal in size with greater than 50%  respiratory variability, suggesting right atrial pressure of 3 mmHg.   9. Compared to study dated 02/02/22, the RV appears dilated now and a  bioprosthetic AVR is now present. After further evaluation-right atrial and ventricle size is are probably normal.  The imaging was "off axis ".   Right and Left Heart Cath 11/08/2020 The left ventricular systolic function is normal. LV end diastolic pressure is normal. The left ventricular ejection fraction is 55-65% by visual estimate. There is moderate aortic valve stenosis.   1. Normal coronary anatomy 2. Moderate Aortic stenosis. Mean gradient 30 mm Hg. AVA 1.16 cm squared with index 0.58 3. Normal LV filling pressures 4. Normal right heart pressures. 5. Normal cardiac output.   Plan; no clear cause for chest pain identified. Medical management.  MRI Cardiac 11/09/2020 1. Findings consistent with acute myocarditis, with elevated native T1, T2, and ECV in basal inferior wall. Also with midwall LGE in apical inferolateral wall, which can be seen in myocarditis   2.  Normal LV size and systolic function (EF [id number])   3.  Normal RV size and systolic function (EF [id number])  Assessment & Plan   1.  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 Caffie Pinto.  She is without complaint.  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  She states she is walking at least a mile near her home without any issues.  Will continue current medication regimen with metoprolol tartrate 25 mg twice daily.  On follow-up appointment consider changing to metoprolol succinate 50 mg daily.  2.  History of  myocarditis: No recurrence doing well.  3.  Hyperlipidemia: Goal of LDL less than 100.  Labs are followed by PCP.  She has been done within the last 6 weeks.  She is not on statin therapy.  Current medicines are reviewed at length with the patient today.  I have spent 25 min's  dedicated to the care of this patient on the date of this encounter to include pre-visit review of records, assessment, management and diagnostic testing,with shared decision making. Signed, Phill Myron. West Pugh, ANP, AACC   09/22/2022 9:15 AM      Office [phone] Fax [phone]  Notice: This dictation was prepared with Dragon dictation along with smaller phrase technology. Any transcriptional errors that result from this process are unintentional and may not be corrected upon review.

## 2022-09-22 ENCOUNTER — Ambulatory Visit: Payer: Commercial Managed Care - HMO | Attending: Cardiology | Admitting: Adult Health

## 2022-09-22 ENCOUNTER — Encounter: Payer: Self-pay | Admitting: Adult Health

## 2022-09-22 VITALS — BP 122/80 | HR 72 | Ht 69.0 in | Wt 196.4 lb

## 2022-09-22 DIAGNOSIS — I401 Isolated myocarditis: Secondary | ICD-10-CM | POA: Diagnosis not present

## 2022-09-22 DIAGNOSIS — Z952 Presence of prosthetic heart valve: Secondary | ICD-10-CM

## 2022-09-22 DIAGNOSIS — E785 Hyperlipidemia, unspecified: Secondary | ICD-10-CM | POA: Diagnosis not present

## 2022-09-22 MED ORDER — METOPROLOL TARTRATE 25 MG PO TABS: 25.0000 mg | ORAL_TABLET | Freq: Two times a day (BID) | ORAL | 5 refills | Status: DC

## 2022-09-22 NOTE — Patient Instructions (Signed)
Medication Instructions:  No Changes *If you need a refill on your cardiac medications before your next appointment, please call your pharmacy*   Lab Work: No Labs If you have labs (blood work) drawn today and your tests are completely normal, you will receive your results only by: [place] (if you have MyChart) OR A paper copy in the mail If you have any lab test that is abnormal or we need to change your treatment, we will call you to review the results.   Testing/Procedures: No Testing   Follow-Up: At [facility], you and your health needs are our priority.  As part of our continuing mission to provide you with exceptional heart care, we have created designated Provider Care Teams.  These Care Teams include your primary Cardiologist (physician) and Advanced Practice Providers (APPs -  Physician Assistants and Nurse Practitioners) who all work together to provide you with the care you need, when you need it.  We recommend signing up for the patient portal called "MyChart".  Sign up information is provided on this After Visit Summary.  MyChart is used to connect with patients for Virtual Visits (Telemedicine).  Patients are able to view lab/test results, encounter notes, upcoming appointments, etc.  Non-urgent messages can be sent to your provider as well.   To learn more about what you can do with MyChart, go to [email or web address].    Your next appointment:   6 month(s)  Provider:   Glenetta Hew, MD

## 2022-10-31 ENCOUNTER — Telehealth: Payer: Commercial Managed Care - HMO

## 2022-11-15 ENCOUNTER — Other Ambulatory Visit: Payer: Self-pay | Admitting: Internal Medicine

## 2022-11-15 DIAGNOSIS — Z1231 Encounter for screening mammogram for malignant neoplasm of breast: Secondary | ICD-10-CM

## 2022-12-22 ENCOUNTER — Ambulatory Visit
Admission: RE | Admit: 2022-12-22 | Discharge: 2022-12-22 | Disposition: A | Discharge status: Home / Self Care | Payer: Commercial Managed Care - HMO | Source: Ambulatory Visit | Attending: Internal Medicine | Admitting: Internal Medicine

## 2022-12-22 DIAGNOSIS — Z1231 Encounter for screening mammogram for malignant neoplasm of breast: Secondary | ICD-10-CM

## 2023-02-19 ENCOUNTER — Encounter: Payer: Self-pay | Admitting: Cardiology

## 2023-02-19 NOTE — Telephone Encounter (Signed)
Called left message . Calling to see if patient wanted to the appointment  mentioned in [place] message . Request the patient reply back via mychart or by phone.

## 2023-02-20 NOTE — Telephone Encounter (Signed)
Spoke to patient , she decline appt  Aug 6 , 2024. She states she was unable to come that day. She states she will follow up with primary first.   If needed she will call back to schedule an appt.

## 2023-03-01 ENCOUNTER — Other Ambulatory Visit: Payer: Self-pay | Admitting: Internal Medicine

## 2023-03-01 ENCOUNTER — Ambulatory Visit
Admission: RE | Admit: 2023-03-01 | Discharge: 2023-03-01 | Disposition: A | Discharge status: Home / Self Care | Payer: Commercial Managed Care - HMO | Source: Ambulatory Visit | Attending: Internal Medicine | Admitting: Internal Medicine

## 2023-03-01 DIAGNOSIS — R0602 Shortness of breath: Secondary | ICD-10-CM

## 2023-03-01 MED ORDER — IOPAMIDOL (ISOVUE-370) INJECTION 76%
80.0000 mL | Freq: Once | INTRAVENOUS | Status: AC | PRN
  Administered 2023-03-01: 80 mL via INTRAVENOUS

## 2023-03-06 ENCOUNTER — Encounter: Payer: Self-pay | Admitting: Cardiology

## 2023-03-07 NOTE — Telephone Encounter (Signed)
Called patient and informed patient Dr Herbie Baltimore will be out of the office until next week. Offered and upcoming appt on 04/06/23 .  Patient decline she would prefer D Harding to review  symptoms to see if appointment is needed.  Rn reassured patient that BNP number was slightly elevated.  She states she has gained some weight since surgery.  She states she became short of breath after traveling not a long distance and then walking to the store . Primary  did check for pulm. Embolus and labs.  Patient wants Dr Herbie Baltimore opinion? Aware will contact once given

## 2023-04-18 ENCOUNTER — Encounter: Payer: Self-pay | Admitting: Cardiology

## 2023-04-18 ENCOUNTER — Ambulatory Visit: Payer: Commercial Managed Care - HMO | Attending: Cardiology | Admitting: Cardiology

## 2023-04-18 VITALS — BP 118/74 | HR 54 | Ht 68.5 in | Wt 205.2 lb

## 2023-04-18 DIAGNOSIS — Q231 Congenital insufficiency of aortic valve: Secondary | ICD-10-CM

## 2023-04-18 DIAGNOSIS — Q230 Congenital stenosis of aortic valve: Secondary | ICD-10-CM

## 2023-04-18 DIAGNOSIS — Z952 Presence of prosthetic heart valve: Secondary | ICD-10-CM

## 2023-04-18 DIAGNOSIS — R002 Palpitations: Secondary | ICD-10-CM

## 2023-04-18 DIAGNOSIS — E785 Hyperlipidemia, unspecified: Secondary | ICD-10-CM

## 2023-04-18 DIAGNOSIS — I514 Myocarditis, unspecified: Secondary | ICD-10-CM | POA: Diagnosis not present

## 2023-04-18 DIAGNOSIS — R0602 Shortness of breath: Secondary | ICD-10-CM

## 2023-04-18 MED ORDER — METOPROLOL SUCCINATE ER 25 MG PO TB24: 37.5000 mg | ORAL_TABLET | Freq: Every day | ORAL | 3 refills | Status: DC

## 2023-04-18 NOTE — Patient Instructions (Signed)
Medication Instructions:  Stop taking Metoprolol tartrate  Start taking Metoprolol succinate 37.5 mg ( 1 and 1/2 tablet) at Bedtime  *If you need a refill on your cardiac medications before your next appointment, please call your pharmacy*   Lab Work: Not needed    Testing/Procedures:  Will be schedule at [address] in Nov 2024 Your physician has requested that you have an echocardiogram. Echocardiography is a painless test that uses sound waves to create images of your heart. It provides your doctor with information about the size and shape of your heart and how well your heart's chambers and valves are working. This procedure takes approximately one hour. There are no restrictions for this procedure. Please do NOT wear cologne, perfume, aftershave, or lotions (deodorant is allowed). Please arrive 15 minutes prior to your appointment time.   Follow-Up: At [facility], you and your health needs are our priority.  As part of our continuing mission to provide you with exceptional heart care, we have created designated Provider Care Teams.  These Care Teams include your primary Cardiologist (physician) and Advanced Practice Providers (APPs -  Physician Assistants and Nurse Practitioners) who all work together to provide you with the care you need, when you need it.     Your next appointment:   6 month(s)  The format for your next appointment:   In Person  Provider:   Joni Reining, DNP, ANP or Bernadene Person, NP    Then, Bryan Lemma, MD will plan to see you again in 12 month(s).

## 2023-04-27 ENCOUNTER — Encounter: Payer: Self-pay | Admitting: Cardiology

## 2023-04-27 NOTE — Assessment & Plan Note (Signed)
Status post SAVR.  Postop echo looked good.  Pending echo in November-December.

## 2023-04-27 NOTE — Assessment & Plan Note (Signed)
No active heart failure symptoms no real chest pain symptoms.  CT scan done to look for PE did not show any evidence of pulmonary edema which would argue for possible myocarditis.  No other concerning features although certainly with her having had both COVID and the flu this spring, there was a concern for that.

## 2023-04-27 NOTE — Assessment & Plan Note (Signed)
First follow-up echocardiogram post AVR in January showed normal function and normal functioning valve.  She seems to be doing much better having pretty much recovered from her surgery now.  She definitely noticed a difference in her ability to be active since her surgery.  Should be due for follow-up echocardiogram in November December timeframe which would be 1 year out from her surgery.  Discussed importance of SBE prophylaxis -> for GI procedures, deep dental procedures.

## 2023-04-27 NOTE — Assessment & Plan Note (Signed)
Overall these seem to be pretty stable but she is noticing a little bit more with her dyspnea now.   I think she is getting a little bit borderline hypotensive with the current dose of Lopressor and sometimes forgets a dose.  Plan: Convert Lopressor 25 mg twice daily to Toprol 37.5 mg daily Ensure adequate hydration.

## 2023-04-27 NOTE — Assessment & Plan Note (Signed)
Should be due to see a PCP soon and have news of the labs done.  She is hoping that with her increased level activity may be her lipids will improve.  If not would probably consider Crestor 10 mg.

## 2023-06-07 ENCOUNTER — Other Ambulatory Visit (HOSPITAL_COMMUNITY): Payer: Commercial Managed Care - HMO

## 2023-06-08 ENCOUNTER — Ambulatory Visit (HOSPITAL_COMMUNITY): Payer: Managed Care, Other (non HMO)

## 2023-07-13 ENCOUNTER — Ambulatory Visit (HOSPITAL_COMMUNITY): Payer: Commercial Managed Care - HMO | Attending: Cardiology

## 2023-07-13 DIAGNOSIS — Q230 Congenital stenosis of aortic valve: Secondary | ICD-10-CM | POA: Insufficient documentation

## 2023-07-13 DIAGNOSIS — Z952 Presence of prosthetic heart valve: Secondary | ICD-10-CM | POA: Insufficient documentation

## 2023-07-13 DIAGNOSIS — Q2381 Bicuspid aortic valve: Secondary | ICD-10-CM | POA: Diagnosis not present

## 2023-07-13 LAB — ECHOCARDIOGRAM COMPLETE
AR max vel: 1.25 cm2
AV Area VTI: 1.28 cm2
AV Area mean vel: 1.18 cm2
AV Mean grad: 19 mm[Hg]
AV Peak grad: 29.7 mm[Hg]
Ao pk vel: 2.73 m/s
S' Lateral: 2.66 cm

## 2023-07-20 ENCOUNTER — Other Ambulatory Visit (HOSPITAL_COMMUNITY): Payer: Self-pay | Admitting: *Deleted

## 2023-07-20 ENCOUNTER — Telehealth: Payer: Self-pay | Admitting: *Deleted

## 2023-07-20 DIAGNOSIS — Q2381 Bicuspid aortic valve: Secondary | ICD-10-CM

## 2023-07-20 DIAGNOSIS — Z952 Presence of prosthetic heart valve: Secondary | ICD-10-CM

## 2023-07-20 DIAGNOSIS — I350 Nonrheumatic aortic (valve) stenosis: Secondary | ICD-10-CM

## 2023-07-20 NOTE — Telephone Encounter (Signed)
Called left detailed message on voicemail of result . Recommend to schedule 1 year ( annual check . Once reviewed by Dr Herbie Baltimore if any changes will contact patient . Order placed for annual Echo -follow up . Any question may call back to the office

## 2023-07-20 NOTE — Telephone Encounter (Signed)
-----   Message from Christell Constant sent at 07/18/2023  3:39 PM EST ----- Slight increase in aortic valve gradients. - in 08/10/22 study, highest mean gradient 14 mm hg - in this study, 14 mm gradient highest assessed in 5 chamber view - increase gradient of 19 mm Hg only see in the [facility] probe assessment, not done on the prior study. - repeat echo in one year.  Will leave this in Dr. Sanjuana Letters as an [place] Childes ----- Message ----- From: Interface, [employer] Sent: 07/13/2023  12:43 PM EST To: Marykay Lex, MD

## 2023-11-21 ENCOUNTER — Other Ambulatory Visit: Payer: Self-pay | Admitting: Internal Medicine

## 2023-11-21 DIAGNOSIS — Z1231 Encounter for screening mammogram for malignant neoplasm of breast: Secondary | ICD-10-CM

## 2023-12-10 NOTE — Progress Notes (Signed)
 Echo results from December 2024:  Slight increase in aortic valve gradients.  - in 08/10/22 study, highest mean gradient 14 mm hg  - in this study, 14 mm gradient highest assessed in 5 chamber view  - increase gradient of 19 mm Hg only see in the [facility] probe assessment, not done on the prior study.  - repeat echo in one year.  Will leave this in Dr. Katharyn Pall as an FYI

## 2024-01-04 ENCOUNTER — Ambulatory Visit

## 2024-01-31 ENCOUNTER — Ambulatory Visit
Admission: RE | Admit: 2024-01-31 | Discharge: 2024-01-31 | Disposition: A | Discharge status: Home / Self Care | Source: Ambulatory Visit | Attending: Internal Medicine | Admitting: Internal Medicine

## 2024-01-31 DIAGNOSIS — Z1231 Encounter for screening mammogram for malignant neoplasm of breast: Secondary | ICD-10-CM

## 2024-05-26 ENCOUNTER — Other Ambulatory Visit: Payer: Self-pay | Admitting: Cardiology

## 2024-07-18 ENCOUNTER — Ambulatory Visit (HOSPITAL_COMMUNITY): Attending: Cardiology

## 2024-07-21 NOTE — Addendum Note (Signed)
 Addended by: GLADIS REENA GAILS on: 07/21/2024 02:01 PM   Modules accepted: Orders

## 2024-07-22 ENCOUNTER — Ambulatory Visit: Admitting: Emergency Medicine

## 2024-07-25 ENCOUNTER — Ambulatory Visit (HOSPITAL_COMMUNITY)
Admission: RE | Admit: 2024-07-25 | Discharge: 2024-07-25 | Disposition: A | Discharge status: Home / Self Care | Source: Ambulatory Visit | Attending: Cardiology | Admitting: Cardiology

## 2024-07-25 ENCOUNTER — Ambulatory Visit: Payer: Self-pay | Admitting: Cardiology

## 2024-07-25 DIAGNOSIS — I350 Nonrheumatic aortic (valve) stenosis: Secondary | ICD-10-CM

## 2024-07-25 DIAGNOSIS — Z952 Presence of prosthetic heart valve: Secondary | ICD-10-CM | POA: Diagnosis not present

## 2024-07-25 DIAGNOSIS — Q2381 Bicuspid aortic valve: Secondary | ICD-10-CM | POA: Insufficient documentation

## 2024-07-25 DIAGNOSIS — I083 Combined rheumatic disorders of mitral, aortic and tricuspid valves: Secondary | ICD-10-CM | POA: Diagnosis not present

## 2024-07-25 DIAGNOSIS — I517 Cardiomegaly: Secondary | ICD-10-CM | POA: Diagnosis not present

## 2024-07-25 LAB — ECHOCARDIOGRAM COMPLETE
AR max vel: 0.95 cm2
AV Area VTI: 1.11 cm2
AV Area mean vel: 0.99 cm2
AV Mean grad: 20.5 mmHg
AV Peak grad: 36 mmHg
Ao pk vel: 3 m/s
Area-P 1/2: 4.1 cm2
Calc EF: 71.1 %
MV VTI: 1.91 cm2
S' Lateral: 2.9 cm
Single Plane A2C EF: 64.1 %
Single Plane A4C EF: 77.1 %

## 2024-08-11 ENCOUNTER — Encounter: Payer: Self-pay | Admitting: Cardiology

## 2024-08-11 ENCOUNTER — Ambulatory Visit: Payer: Self-pay | Attending: Cardiology | Admitting: Cardiology

## 2024-08-11 VITALS — BP 136/74 | HR 61 | Ht 69.0 in | Wt 197.0 lb

## 2024-08-11 DIAGNOSIS — I350 Nonrheumatic aortic (valve) stenosis: Secondary | ICD-10-CM

## 2024-08-11 DIAGNOSIS — I071 Rheumatic tricuspid insufficiency: Secondary | ICD-10-CM

## 2024-08-11 DIAGNOSIS — R002 Palpitations: Secondary | ICD-10-CM

## 2024-08-11 DIAGNOSIS — I517 Cardiomegaly: Secondary | ICD-10-CM

## 2024-08-11 DIAGNOSIS — E785 Hyperlipidemia, unspecified: Secondary | ICD-10-CM

## 2024-08-11 DIAGNOSIS — Z952 Presence of prosthetic heart valve: Secondary | ICD-10-CM

## 2024-08-11 DIAGNOSIS — Q2381 Bicuspid aortic valve: Secondary | ICD-10-CM

## 2024-08-11 NOTE — Progress Notes (Signed)
 " Cardiology Office Note:  .   Date:  08/17/2024  ID:  Olivia Mcgrath, DOB [birth year], MRN [id number] PCP: Dwight Trula SQUIBB, MD  [facility] Providers Cardiologist:  Alm Clay, MD     Chief Complaint  Patient presents with   Follow-up    Delayed follow-up.   Cardiac Valve Problem    History of SAVR.  Has had 2 echoes in the last saw her.    Patient Profile: Olivia Mcgrath is a  64 y.o. female non-smoker with a PMH notable for Congenital Bicuspid Aortic Valve s/p SAVR, along with HTN and HLD who presents here for delayed annual follow-up to discuss surveillance echocardiogram 8 at the request of Dwight Trula SQUIBB, MD.  Open: Bicuspid Aortic Valve -previously Moderate AS (mAV gradient ~34 mmHg as of October 2022 => Follow-Up Echo July 2023 mean gradient 42 mmHg => Severe AS echo July 2023) Open sAVR with Inspiris Resilia 21 mm aortic valve.  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  Working 3 days a week and getting her energy level back.  Maybe walking about a mile without issues.  Consider converting from Lopressor  to Toprol  50 mg.  I last saw Olivia Mcgrath on April 18, 2023: She noted some exertional dyspnea palpitations with some fatigue but getting stronger having recovered from COVID.  No major cardiac issues.  We stopped her metoprolol  to tartrate and continue Toprol  25 to 37.5 mg daily and scheduled upcoming echocardiogram for November 2024   Subjective  Discussed the use of AI scribe software for clinical note transcription with the patient, who gave verbal consent to proceed.  History of Present Illness Olivia Mcgrath is a 64 year old female with a history of aortic valve replacement and chronic myocarditis who  presents for a follow-up visit.  She underwent aortic valve replacement in November 2023 due to a bicuspid aortic valve and chronic myocarditis following a COVID-19 infection. She currently has no symptoms of shortness of breath, chest pain, or fatigue and can walk two miles without difficulty. A recent echocardiogram from December 2024 showed an ejection fraction of 60-65%. The bioprosthetic aortic valve has a mean gradient of 21. There is mild to moderate tricuspid regurgitation.  She has a history of elevated cholesterol levels, with the most recent labs from January 2025 showing a total cholesterol of 232, HDL of 55, LDL of 130, and triglycerides of 136. She is not currently on any cholesterol-lowering medication.  She is currently taking metoprolol  37.5 mg and aspirin . She recently started semaglutide two weeks ago and has lost five pounds since starting the medication.  She experiences occasional palpitations described as 'a little skippy' or 'a little flutter' when lying down, but they do not bother her.  She denies any chest pain pressure or dyspnea at rest or exertion.  No PND orthopnea or edema.  No prolonged irregular heartbeats palpitations beyond the little skipping beats.  No syncope or near STEMI, TIA ramus medius, claudication.     Objective   Medications:  Aspirin  81 mg daily, metoprolol  succinate (Toprol -XLl) 37.5 mg daily,; Recently initiated on Wegovy 0.5 mg weekly (weight loss) Protonix  40 mg daily, estradiol  2 g nightly; Lexapro  10 Miller daily Otherwise PRN medications including Tylenol , albuterol,  baclofen , Zofran , Valtrex  Olivia Mcgrath is married mother of 2 daughters.  her husband is also a patient of mine.    Studies Reviewed: SABRA   EKG Interpretation Date/Time:  Monday August 11 2024 16:31:35 EST Ventricular Rate:  62 PR Interval:  204 QRS Duration:  104 QT Interval:  438 QTC Calculation: 444 R Axis:   -29  Text Interpretation: Normal sinus rhythm  Incomplete right bundle branch block Nonspecific ST abnormality When compared with ECG of 18-Apr-2023 08:57, No significant change was found Confirmed by Anner Lenis ([id number]) on 08/17/2024 7:17:43 PM    Results Labs Lipid panel (08/2023): Total cholesterol 232, HDL 55, LDL 130, triglycerides 136; LDL 153 on 07/2022; LDL 120 previously  Diagnostic Transthoracic echocardiogram (07/13/2023): Left ventricular ejection fraction 60-65%, no wall motion abnormalities, grade 2 diastolic dysfunction, right ventricular mildly reduced systolic function with normal pressures, left atrium mildly dilated, bioprosthetic aortic valve mean gradient 19 mmHg, aortic root mildly dilated, mitral valve normal, tricuspid valve moderate regurgitation  Transthoracic echocardiogram (06/2024): 21 mm Edwards and Spears Resilia bioprosthetic aortic valve well seated in aortic position, no regurgitation, no stenosis, mean gradient 21 mmHg, mitral valve normal, tricuspid valve mild to moderate regurgitation, mild mitral regurgitation  Previous Cardiac Studies: Right and Left Heart Cath 11/08/2020: Normal coronary arteries.  Aortic stenosis with a mean AVG 30 mmHg.  LVEDP normal.  Normal right heart pressures.  Normal CO/CI.   ECHO 08/10/2022 (s/p AVR).: EF 60 to 65%.  No RWMA.  GR 2 DD.  Elevated LAP.  RV noted to be severely enlarged however but on review.  Looked appear to be normal.  RVSP was normal.  Mild LA dilation.  Also RA was noted to be severely enlarged, also likely off axis.  Inspiris Resilia aortic valve present with mean AVG 11.7 mmHg.  Thoracic aorta 38 mm.  On initial review the RA and RV appear to be dilated compared to previous study and the AVR is present.  However on relook, visualization appear to be off axis therefore probably not accurate.   Risk Assessment/Calculations:            Physical Exam:   VS:  BP 136/74   Pulse 61   Ht 5' 9 (1.753 m)   Wt 197 lb (89.4 kg)   SpO2 98%   BMI 29.09 kg/m     Wt Readings from Last 3 Encounters:  08/11/24 197 lb (89.4 kg)  04/18/23 205 lb 3.2 oz (93.1 kg)  09/22/22 196 lb 6.4 oz (89.1 kg)    GEN: Well nourished, well groomed; in no acute distress; obese otherwise healthy appearing NECK: No JVD; No carotid bruits CARDIAC: RRR, with ectopy; normal S1, S2; harsh 2/6 SEM at RUSB.  Otherwise no murmurs, rubs, gallops RESPIRATORY:  Clear to auscultation without rales, wheezing or rhonchi ; nonlabored, good air movement. ABDOMEN: Soft, non-tender, non-distended EXTREMITIES:  No edema; No deformity     ASSESSMENT AND PLAN: .   Aortic stenosis due to bicuspid aortic valve S/p SAVR bioprosthetic valve in November 2023. Has done well postoperatively with no major issues. Routine surveillance echocardiogram performed-stable.  Minimal prosthetic gradient-roughly stable over the last 2 years.  Mild concentric left ventricular hypertrophy (LVH) Likely related to longstanding bicuspid valve with aortic stenosis.  BP mildly elevated, monitor for now but continuing with current dose of Toprol  37 5 mg daily.  Low threshold to add afterload reduction.  S/P SAVR: Bioprosthetic aortic valve replacement for bicuspid aortic stenosis Bioprosthetic valve well-seated,  no regurgitation or stenosis. Mean gradient 21, within normal range. Grade 2 diastolic dysfunction due to valve stiffness. Right ventricle mildly reduced, normal pressures. Left atrium mildly dilated. Aorta slightly dilated, known issue. Myocarditis from previous COVID has resolved. - Continue annual echocardiogram to monitor valve function.  Echo ordered for November-December 2026 - Ensure antibiotic prophylaxis with amoxicillin 2 grams one hour before dental or GI procedures.  Dyslipidemia, goal LDL below 100 LDL 153 mg/dL, total cholesterol 767 mg/dL, HDL 55 mg/dL, triglycerides 863 mg/dL. Genetic component suspected. Previous coronary catheterization clean. Discussed statin therapy if LDL remains  elevated. Currently on 20 mg Crestor daily, can take with aspirin . Discussed statin side effects, low doses well-tolerated. - Recheck lipid panel with primary care provider in two weeks. - If LDL remains above 120 mg/dL, consider starting or adjusting statin therapy. - Would recommend initiating 20 mg Crestor daily.  Palpitations Well-controlled with current dose of Toprol -XL 37.5 mg daily.  No longer requiring short acting Lopressor . -Continue Toprol .  Tricuspid regurgitation Mild to moderate tricuspid regurgitation, likely related to previous valve issues. No significant symptoms reported. Explained fluctuation related to congestion or pressure changes. - Continue to monitor for symptoms such as shortness of breath or fatigue.  Continue to follow-up with echocardiograms for SAVR   Orders Placed This Encounter  Procedures   EKG 12-Lead   ECHOCARDIOGRAM COMPLETE        Follow-Up: Return in about 1 year (around 08/11/2025) for 1 Yr Follow-up, Routine follow up with me, [employer].     Signed, Alm MICAEL Clay, MD, MS Alm Clay, M.D., M.S. Interventional Cardiologist  [facility] Pager # [phone]      "

## 2024-08-11 NOTE — Patient Instructions (Addendum)
 Medication Instructions:  No changes  *If you need a refill on your cardiac medications before your next appointment, please call your pharmacy*   Lab Work: Not needed    Testing/Procedures: Your physician has requested that you have an echocardiogram. Echocardiography is a painless test that uses sound waves to create images of your heart. It provides your doctor with information about the size and shape of your heart and how well your heart's chambers and valves are working. This procedure takes approximately one hour. There are no restrictions for this procedure. Please do NOT wear cologne, perfume, aftershave, or lotions (deodorant is allowed). Please arrive 15 minutes prior to your appointment time.  Please note: We ask at that you not bring children with you during ultrasound (echo/ vascular) testing. Due to room size and safety concerns, children are not allowed in the ultrasound rooms during exams. Our front office staff cannot provide observation of children in our lobby area while testing is being conducted. An adult accompanying a patient to their appointment will only be allowed in the ultrasound room at the discretion of the ultrasound technician under special circumstances. We apologize for any inconvenience.    Follow-Up: At [facility], you and your health needs are our priority.  As part of our continuing mission to provide you with exceptional heart care, we have created designated Provider Care Teams.  These Care Teams include your primary Cardiologist (physician) and Advanced Practice Providers (APPs -  Physician Assistants and Nurse Practitioners) who all work together to provide you with the care you need, when you need it.     Your next appointment:   12 month(s)  The format for your next appointment:   In Person  Provider:   Alm Clay, MD

## 2024-08-17 ENCOUNTER — Encounter: Payer: Self-pay | Admitting: Cardiology

## 2024-08-17 DIAGNOSIS — I071 Rheumatic tricuspid insufficiency: Secondary | ICD-10-CM | POA: Insufficient documentation

## 2024-08-17 NOTE — Assessment & Plan Note (Signed)
 LDL 153 mg/dL, total cholesterol 767 mg/dL, HDL 55 mg/dL, triglycerides 863 mg/dL. Genetic component suspected. Previous coronary catheterization clean. Discussed statin therapy if LDL remains elevated. Currently on 20 mg Crestor daily, can take with aspirin . Discussed statin side effects, low doses well-tolerated. - Recheck lipid panel with primary care provider in two weeks. - If LDL remains above 120 mg/dL, consider starting or adjusting statin therapy. - Would recommend initiating 20 mg Crestor daily.

## 2024-08-17 NOTE — Assessment & Plan Note (Signed)
 Mild to moderate tricuspid regurgitation, likely related to previous valve issues. No significant symptoms reported. Explained fluctuation related to congestion or pressure changes. - Continue to monitor for symptoms such as shortness of breath or fatigue.  Continue to follow-up with echocardiograms for SAVR

## 2024-08-17 NOTE — Assessment & Plan Note (Signed)
 Likely related to longstanding bicuspid valve with aortic stenosis.  BP mildly elevated, monitor for now but continuing with current dose of Toprol  37 5 mg daily.  Low threshold to add afterload reduction.

## 2024-08-17 NOTE — Assessment & Plan Note (Signed)
 S/p SAVR bioprosthetic valve in November 2023. Has done well postoperatively with no major issues. Routine surveillance echocardiogram performed-stable.  Minimal prosthetic gradient-roughly stable over the last 2 years.

## 2024-08-17 NOTE — Assessment & Plan Note (Addendum)
 Bioprosthetic valve well-seated, no regurgitation or stenosis. Mean gradient 21, within normal range. Grade 2 diastolic dysfunction due to valve stiffness. Right ventricle mildly reduced, normal pressures. Left atrium mildly dilated. Aorta slightly dilated, known issue. Myocarditis from previous COVID has resolved. - Continue annual echocardiogram to monitor valve function.  Echo ordered for November-December 2026 - Ensure antibiotic prophylaxis with amoxicillin 2 grams one hour before dental or GI procedures.

## 2024-08-17 NOTE — Assessment & Plan Note (Signed)
 Well-controlled with current dose of Toprol -XL 37.5 mg daily.  No longer requiring short acting Lopressor . -Continue Toprol .

## 2024-08-20 ENCOUNTER — Encounter: Payer: Self-pay | Admitting: Cardiology
